# Patient Record
Sex: Female | Born: 1935 | Race: White | Hispanic: No | Smoking: Former smoker
Health system: Southern US, Community
[De-identification: ages and names within clinical notes are randomized; demographics above are authoritative.]

## PROBLEM LIST (undated history)

## (undated) DIAGNOSIS — M810 Age-related osteoporosis without current pathological fracture: Secondary | ICD-10-CM

## (undated) DIAGNOSIS — I8393 Asymptomatic varicose veins of bilateral lower extremities: Secondary | ICD-10-CM

## (undated) DIAGNOSIS — M1990 Unspecified osteoarthritis, unspecified site: Secondary | ICD-10-CM

## (undated) DIAGNOSIS — H3530 Unspecified macular degeneration: Secondary | ICD-10-CM

## (undated) DIAGNOSIS — Z9049 Acquired absence of other specified parts of digestive tract: Secondary | ICD-10-CM

## (undated) DIAGNOSIS — I10 Essential (primary) hypertension: Secondary | ICD-10-CM

## (undated) DIAGNOSIS — E785 Hyperlipidemia, unspecified: Secondary | ICD-10-CM

## (undated) DIAGNOSIS — A809 Acute poliomyelitis, unspecified: Secondary | ICD-10-CM

## (undated) HISTORY — DX: Unspecified osteoarthritis, unspecified site: M19.90

## (undated) HISTORY — PX: BREAST LUMPECTOMY W/ NEEDLE LOCALIZATION: SHX1266

## (undated) HISTORY — DX: Acute poliomyelitis, unspecified: A80.9

## (undated) HISTORY — DX: Acquired absence of other specified parts of digestive tract: Z90.49

---

## 2013-06-06 ENCOUNTER — Other Ambulatory Visit: Payer: Self-pay

## 2013-06-06 DIAGNOSIS — Z1231 Encounter for screening mammogram for malignant neoplasm of breast: Secondary | ICD-10-CM

## 2013-07-03 ENCOUNTER — Ambulatory Visit
Admission: RE | Admit: 2013-07-03 | Discharge: 2013-07-03 | Disposition: A | Discharge status: Home / Self Care | Payer: Medicare Other | Source: Ambulatory Visit

## 2013-07-03 DIAGNOSIS — Z1231 Encounter for screening mammogram for malignant neoplasm of breast: Secondary | ICD-10-CM

## 2015-04-13 ENCOUNTER — Other Ambulatory Visit: Payer: Self-pay | Admitting: Family Medicine

## 2015-04-13 DIAGNOSIS — Z1231 Encounter for screening mammogram for malignant neoplasm of breast: Secondary | ICD-10-CM

## 2015-04-13 DIAGNOSIS — M810 Age-related osteoporosis without current pathological fracture: Secondary | ICD-10-CM

## 2015-04-29 ENCOUNTER — Other Ambulatory Visit: Payer: Self-pay

## 2015-04-29 ENCOUNTER — Ambulatory Visit: Payer: Self-pay

## 2015-05-04 ENCOUNTER — Ambulatory Visit
Admission: RE | Admit: 2015-05-04 | Discharge: 2015-05-04 | Disposition: A | Discharge status: Home / Self Care | Payer: Commercial Managed Care - HMO | Source: Ambulatory Visit | Attending: Family Medicine | Admitting: Family Medicine

## 2015-05-04 ENCOUNTER — Other Ambulatory Visit: Payer: Self-pay

## 2015-05-04 DIAGNOSIS — Z1231 Encounter for screening mammogram for malignant neoplasm of breast: Secondary | ICD-10-CM

## 2017-01-05 DIAGNOSIS — H353221 Exudative age-related macular degeneration, left eye, with active choroidal neovascularization: Secondary | ICD-10-CM | POA: Diagnosis not present

## 2017-01-18 DIAGNOSIS — N6332 Unspecified lump in axillary tail of the left breast: Secondary | ICD-10-CM | POA: Diagnosis not present

## 2017-01-18 DIAGNOSIS — R928 Other abnormal and inconclusive findings on diagnostic imaging of breast: Secondary | ICD-10-CM | POA: Diagnosis not present

## 2017-01-18 DIAGNOSIS — R229 Localized swelling, mass and lump, unspecified: Secondary | ICD-10-CM | POA: Diagnosis not present

## 2017-02-16 DIAGNOSIS — R2232 Localized swelling, mass and lump, left upper limb: Secondary | ICD-10-CM | POA: Diagnosis not present

## 2017-02-23 DIAGNOSIS — H353232 Exudative age-related macular degeneration, bilateral, with inactive choroidal neovascularization: Secondary | ICD-10-CM | POA: Diagnosis not present

## 2017-03-22 DIAGNOSIS — R69 Illness, unspecified: Secondary | ICD-10-CM | POA: Diagnosis not present

## 2017-04-03 DIAGNOSIS — E785 Hyperlipidemia, unspecified: Secondary | ICD-10-CM | POA: Diagnosis not present

## 2017-04-03 DIAGNOSIS — D235 Other benign neoplasm of skin of trunk: Secondary | ICD-10-CM | POA: Diagnosis not present

## 2017-04-03 DIAGNOSIS — I10 Essential (primary) hypertension: Secondary | ICD-10-CM | POA: Diagnosis not present

## 2017-04-03 DIAGNOSIS — Z87891 Personal history of nicotine dependence: Secondary | ICD-10-CM | POA: Diagnosis not present

## 2017-04-03 DIAGNOSIS — R2232 Localized swelling, mass and lump, left upper limb: Secondary | ICD-10-CM | POA: Diagnosis not present

## 2017-04-03 DIAGNOSIS — R222 Localized swelling, mass and lump, trunk: Secondary | ICD-10-CM | POA: Diagnosis not present

## 2017-04-03 DIAGNOSIS — Z01818 Encounter for other preprocedural examination: Secondary | ICD-10-CM | POA: Diagnosis not present

## 2017-04-03 DIAGNOSIS — D1722 Benign lipomatous neoplasm of skin and subcutaneous tissue of left arm: Secondary | ICD-10-CM | POA: Diagnosis not present

## 2017-04-06 DIAGNOSIS — Z791 Long term (current) use of non-steroidal anti-inflammatories (NSAID): Secondary | ICD-10-CM | POA: Diagnosis not present

## 2017-04-06 DIAGNOSIS — Z7982 Long term (current) use of aspirin: Secondary | ICD-10-CM | POA: Diagnosis not present

## 2017-04-06 DIAGNOSIS — Z09 Encounter for follow-up examination after completed treatment for conditions other than malignant neoplasm: Secondary | ICD-10-CM | POA: Diagnosis not present

## 2017-04-06 DIAGNOSIS — Z0000 Encounter for general adult medical examination without abnormal findings: Secondary | ICD-10-CM | POA: Diagnosis not present

## 2017-04-06 DIAGNOSIS — I10 Essential (primary) hypertension: Secondary | ICD-10-CM | POA: Diagnosis not present

## 2017-04-06 DIAGNOSIS — M1611 Unilateral primary osteoarthritis, right hip: Secondary | ICD-10-CM | POA: Diagnosis not present

## 2017-04-06 DIAGNOSIS — M810 Age-related osteoporosis without current pathological fracture: Secondary | ICD-10-CM | POA: Diagnosis not present

## 2017-04-06 DIAGNOSIS — Z6821 Body mass index (BMI) 21.0-21.9, adult: Secondary | ICD-10-CM | POA: Diagnosis not present

## 2017-04-06 DIAGNOSIS — H3530 Unspecified macular degeneration: Secondary | ICD-10-CM | POA: Diagnosis not present

## 2017-04-06 DIAGNOSIS — Z7983 Long term (current) use of bisphosphonates: Secondary | ICD-10-CM | POA: Diagnosis not present

## 2017-04-06 DIAGNOSIS — E7800 Pure hypercholesterolemia, unspecified: Secondary | ICD-10-CM | POA: Diagnosis not present

## 2017-04-07 DIAGNOSIS — M1610 Unilateral primary osteoarthritis, unspecified hip: Secondary | ICD-10-CM | POA: Diagnosis not present

## 2017-04-13 DIAGNOSIS — H353221 Exudative age-related macular degeneration, left eye, with active choroidal neovascularization: Secondary | ICD-10-CM | POA: Diagnosis not present

## 2017-04-17 IMAGING — MG MM DIGITAL SCREENING BILAT W/ CAD
4 series · 4 of 4 positions shown · non-contrast
Comparison: Previous exam(s).

CLINICAL DATA: Screening.

EXAM:
DIGITAL SCREENING BILATERAL MAMMOGRAM WITH CAD

[R CC]
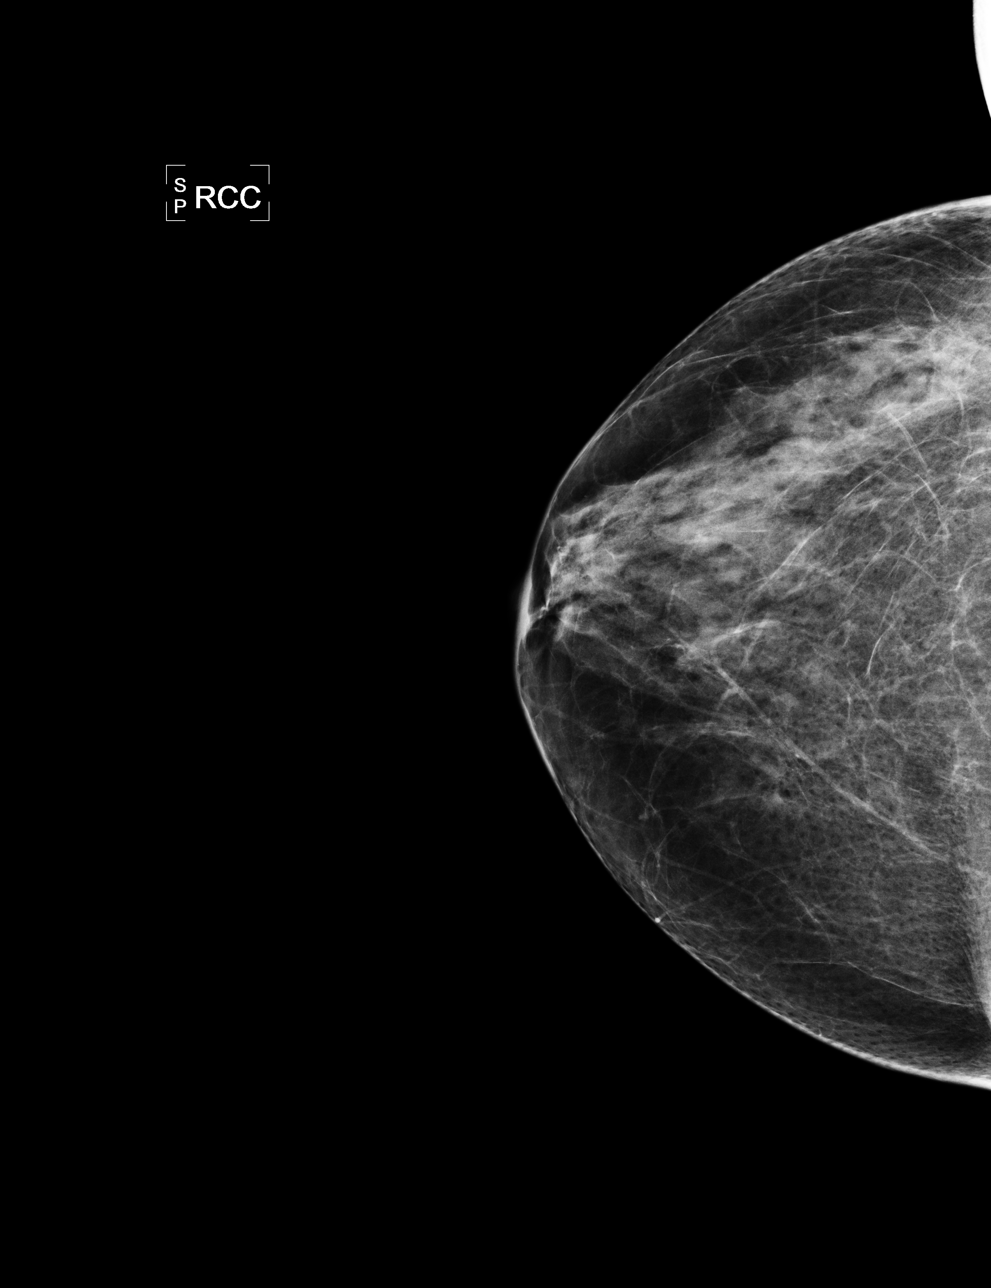

[L CC]
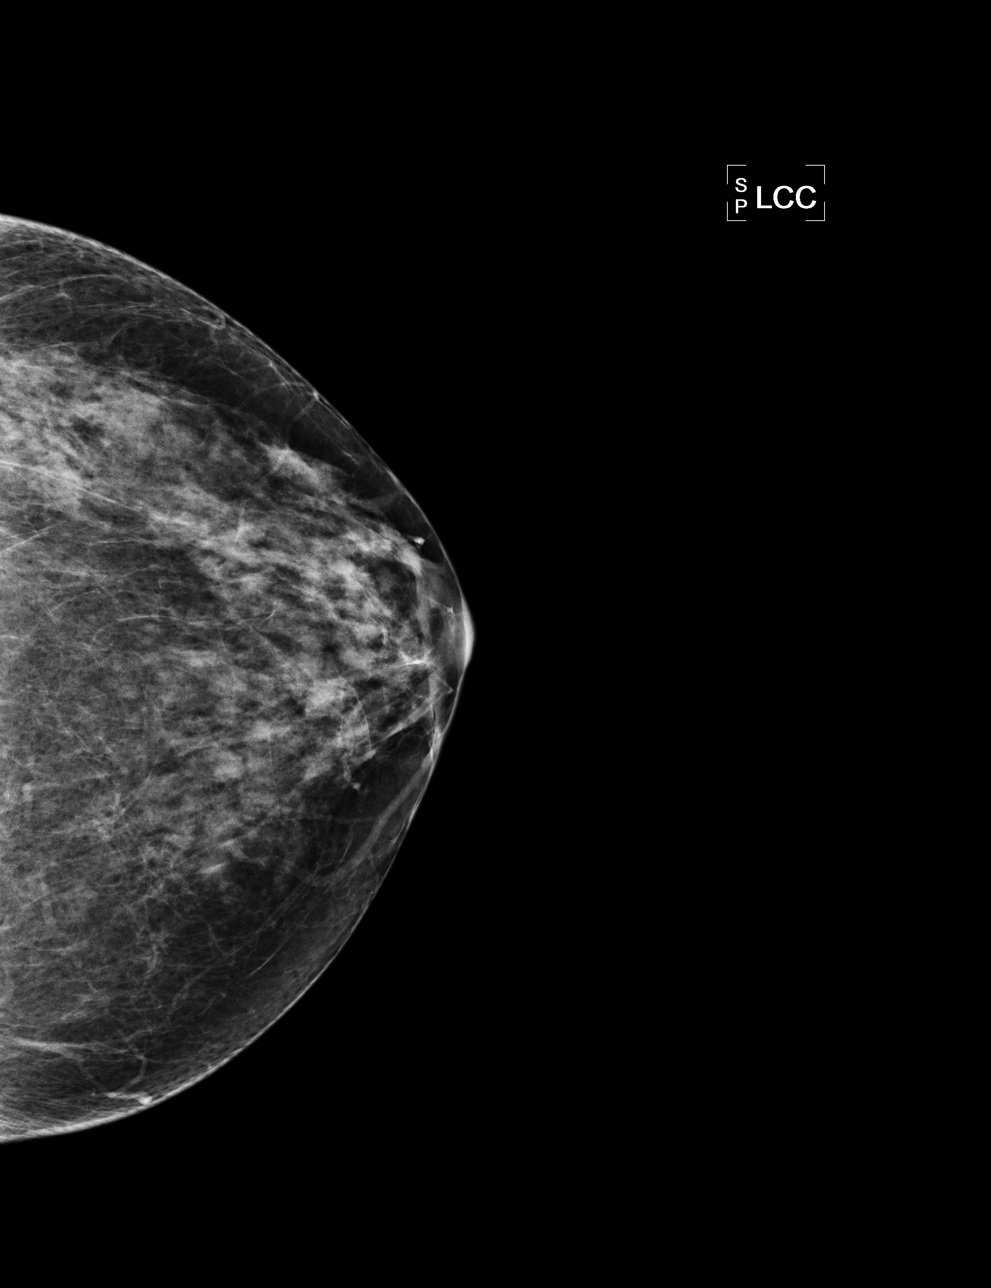

[L MLO]
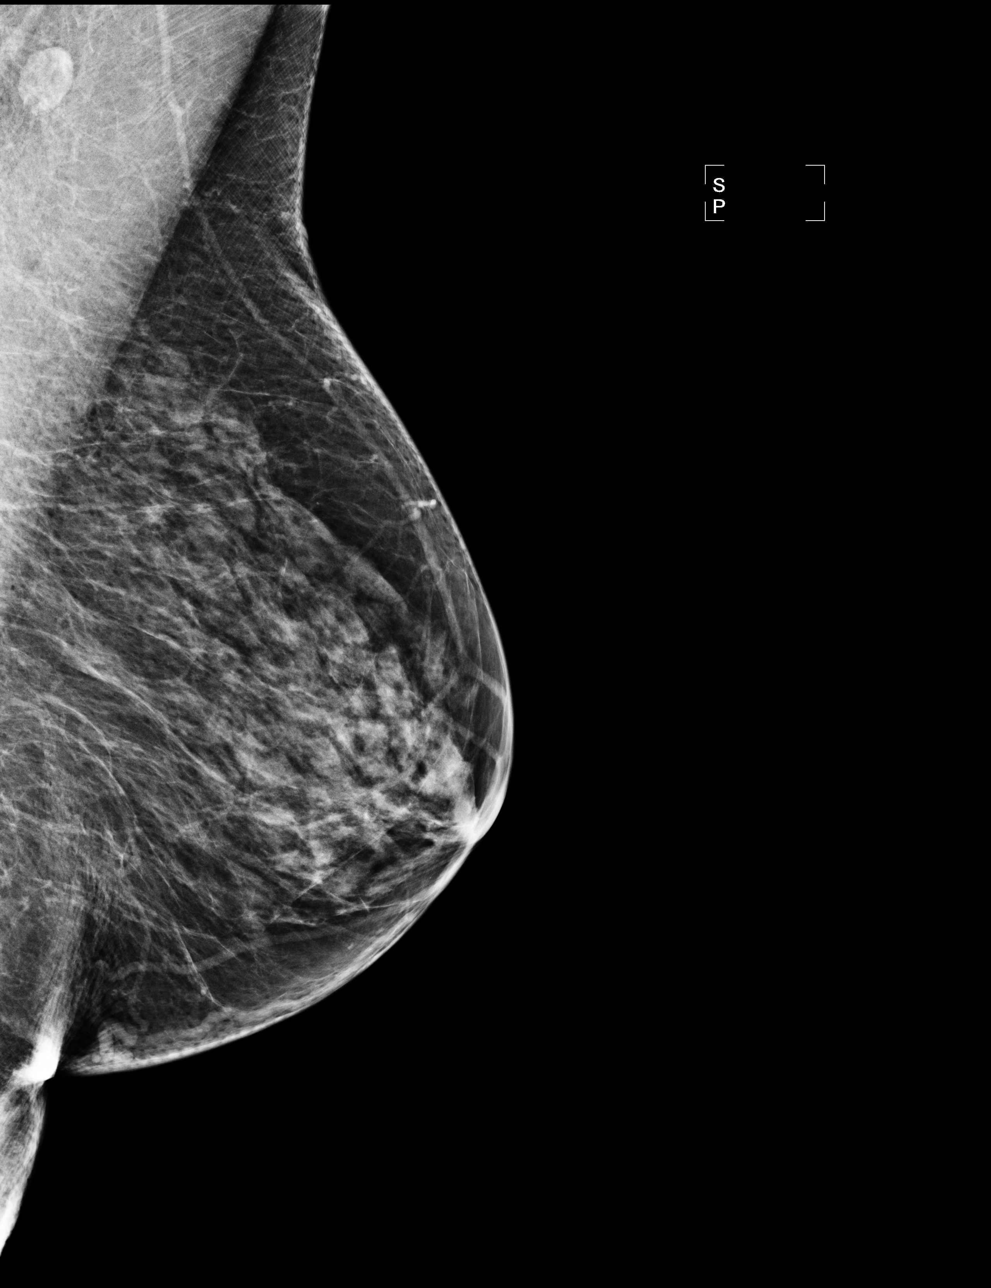

[R MLO]
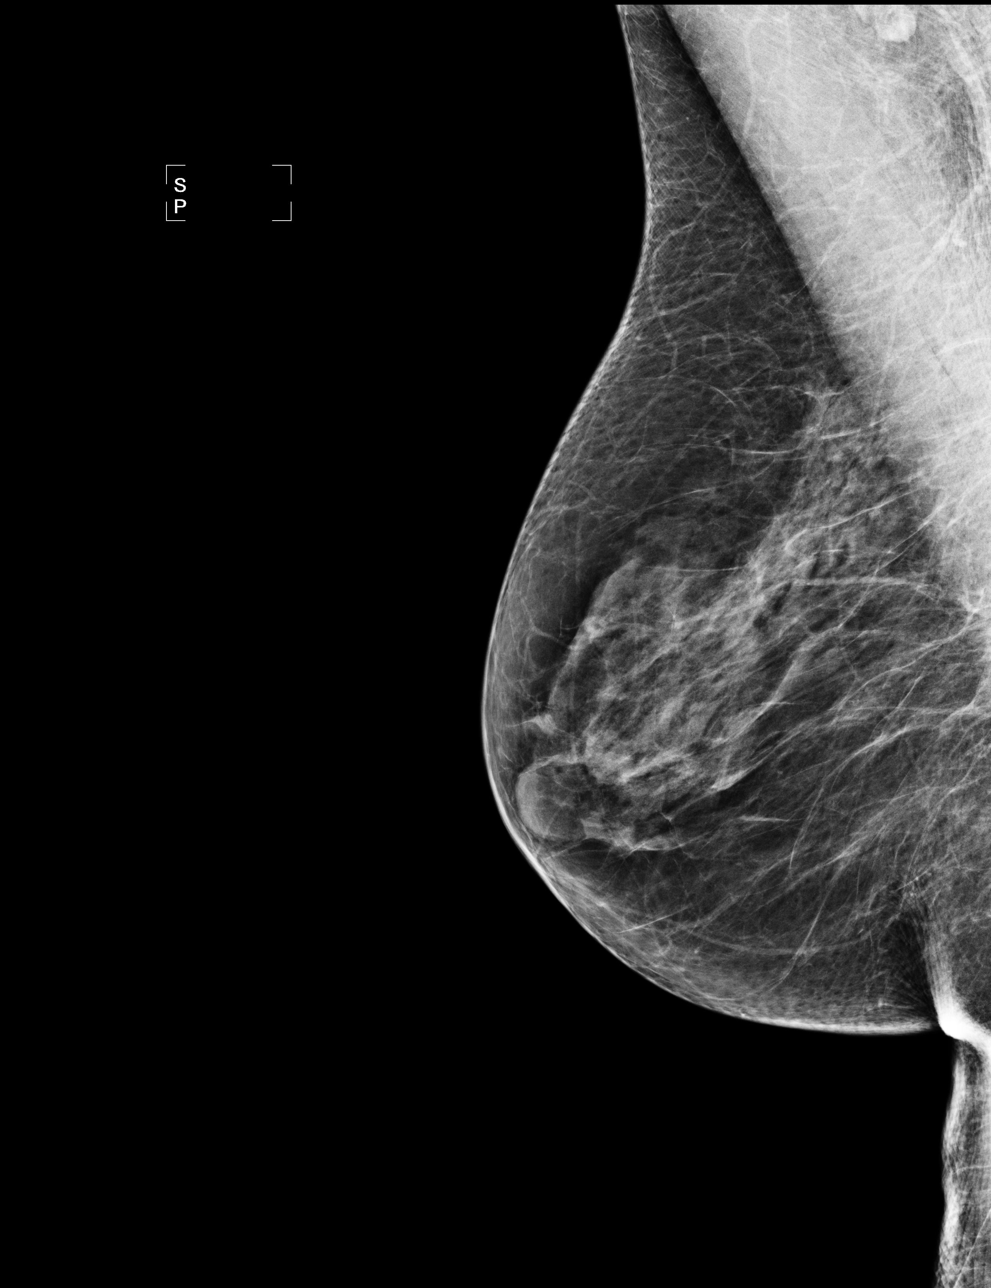

[4 of 4 positions shown; findings below may reference images not displayed]

ACR Breast Density Category c: The breast tissue is heterogeneously
dense, which may obscure small masses.
FINDINGS: There are no findings suspicious for malignancy. Images were
processed with CAD.
IMPRESSION: No mammographic evidence of malignancy. A result letter of this
screening mammogram will be mailed directly to the patient.

RECOMMENDATION:
Screening mammogram in one year. (Code:[id number])

BI-RADS CATEGORY  1: Negative.

## 2017-05-02 DIAGNOSIS — Z09 Encounter for follow-up examination after completed treatment for conditions other than malignant neoplasm: Secondary | ICD-10-CM | POA: Diagnosis not present

## 2017-05-25 DIAGNOSIS — H353222 Exudative age-related macular degeneration, left eye, with inactive choroidal neovascularization: Secondary | ICD-10-CM | POA: Diagnosis not present

## 2017-06-08 DIAGNOSIS — M1611 Unilateral primary osteoarthritis, right hip: Secondary | ICD-10-CM | POA: Diagnosis not present

## 2017-06-22 DIAGNOSIS — H353221 Exudative age-related macular degeneration, left eye, with active choroidal neovascularization: Secondary | ICD-10-CM | POA: Diagnosis not present

## 2017-07-27 DIAGNOSIS — H353221 Exudative age-related macular degeneration, left eye, with active choroidal neovascularization: Secondary | ICD-10-CM | POA: Diagnosis not present

## 2017-08-10 ENCOUNTER — Ambulatory Visit: Payer: Self-pay | Admitting: Orthopedic Surgery

## 2017-08-14 ENCOUNTER — Ambulatory Visit: Payer: Self-pay | Admitting: Orthopedic Surgery

## 2017-08-14 NOTE — H&P (Signed)
Pamela Price DOB: 11-Jun-1933 Married / Language: English / Race: White Female Date of admission: August 30, 2017  Chief complaint: Right hip pain History of Present Illness  The patient is a 83 year old female who comes in for a preoperative History and Physical. The patient is scheduled for a right total hip arthroplasty (anterior) to be performed by Dr. Dione Plover. Aluisio, MD at [facility] on 08/30/2017. The patient is a 83 year old female who presented with a hip problem. The patient was seen for a second opinion.The patient reports right hip problems including pain symptoms that have been present for 1 year(s). The symptoms began without any known injury. Symptoms reported include hip pain, stiffness and difficulty flexing hip The patient reports symptoms radiating to the: right thigh and right knee. The patient describes the hip problem as aching. Onset of symptoms was gradual with symptoms now occuring frequently.The symptoms are described as mild.The patient feels as if their symptoms are does feel they are worsening. Symptoms are exacerbated by squatting and sitting. Symptoms are relieved by stretching. Current treatment includes nonsteroidal anti-inflammatory drugs (Mobic bid). Prior to being seen, the patient was previously evaluated by a colleague (Dr. Ian Malkin at [place]). Previous workup for this problem has included hip x-rays. Previous treatment for this problem has included none (other than the Mobic). she is having considerable pain in right hip and even worse of the functional limitations that she is experiencing. The hip feels unstable to her and she feels as though she is going to fall. She has pain with all activities but she said she can tolerate the pain. She cannot tolerate the dysfunction and inability to do think she desires. She also cannot tolerate the unstable feeling of the hip. She does not have night pain at this point. She is unable to do  things that she desires to do. She is ready to get the hip fixed at this time. They have been treated conservatively in the past for the above stated problem and despite conservative measures, they continue to have progressive pain and severe functional limitations and dysfunction. They have failed non-operative management including home exercise, medications. It is felt that they would benefit from undergoing total joint replacement. Risks and benefits of the procedure have been discussed with the patient and they elect to proceed with surgery. There are no active contraindications to surgery such as ongoing infection or rapidly progressive neurological disease.    Problem List/Past Medical  Primary osteoarthritis of right hip (M16.11)  High blood pressure  Hypercholesterolemia  Osteoporosis  Osteoarthritis  Polio  Childhood Illness Macular Degeneration   Allergies SulfADIAZINE *Sulfonamides**  Rash.  Family History Cancer  Father, Mother.  Social History Children  3 Current work status  retired Furniture conservator/restorer weekly; does running / walking Living situation  live with spouse Marital status  married Never consumed alcohol  06/08/2017: Never consumed alcohol No history of drug/alcohol rehab  Not under pain contract  Number of flights of stairs before winded  4-5 Tobacco use  Never smoker. 06/08/2017  Medication History  Mobic (7.5MG  Tablet, Oral two times daily) Active. Lisinopril (Oral) Specific strength unknown - Active. Pravastatin Sodium (Oral) Specific strength unknown - Active. Alendronate Sodium (Oral) Specific strength unknown - Active. Eye Vitamins (Oral) Active. Aspirin (81MG  Tablet, Oral) Active. Calcium Active.   Past Surgical History  Benign Tumor Excision Left Chest Wall     Review of Systems General Not Present- Chills, Fatigue, Fever, Memory Loss,  Night Sweats, Weight Gain and Weight Loss. Skin Not Present- Eczema,  Hives, Itching, Lesions and Rash. HEENT Not Present- Dentures, Double Vision, Headache, Hearing Loss, Tinnitus and Visual Loss. Respiratory Not Present- Allergies, Chronic Cough, Coughing up blood, Shortness of breath at rest and Shortness of breath with exertion. Cardiovascular Not Present- Chest Pain, Difficulty Breathing Lying Down, Murmur, Palpitations, Racing/skipping heartbeats and Swelling. Gastrointestinal Not Present- Abdominal Pain, Bloody Stool, Constipation, Diarrhea, Difficulty Swallowing, Heartburn, Jaundice, Loss of appetitie, Nausea and Vomiting. Female Genitourinary Not Present- Blood in Urine, Discharge, Flank Pain, Incontinence, Painful Urination, Urgency, Urinary frequency, Urinary Retention, Urinating at Night and Weak urinary stream. Musculoskeletal Present- Joint Pain. Not Present- Back Pain, Joint Swelling, Morning Stiffness, Muscle Pain, Muscle Weakness and Spasms. Neurological Not Present- Blackout spells, Difficulty with balance, Dizziness, Paralysis, Tremor and Weakness. Psychiatric Not Present- Insomnia.  Vitals Weight: 113 lb Height: 62in Weight was reported by patient. Height was reported by patient. Body Surface Area: 1.5 m Body Mass Index: 20.67 kg/m  Pulse: 76 (Regular)  BP: 144/70 (Sitting, Right Arm, Standard)    Physical Exam  General Mental Status -Alert, cooperative and good historian. General Appearance-pleasant, Not in acute distress. Orientation-Oriented X3. Build & Nutrition-Well nourished and Well developed.  Head and Neck Head-normocephalic, atraumatic . Neck Global Assessment - supple, no bruit auscultated on the right, no bruit auscultated on the left.  Eye Pupil - Bilateral-Regular and Round. Motion - Bilateral-EOMI.  Chest and Lung Exam Auscultation Breath sounds - clear at anterior chest wall and clear at posterior chest wall. Adventitious sounds - No Adventitious  sounds.  Cardiovascular Auscultation Rhythm - Regular rate and rhythm. Heart Sounds - S1 WNL and S2 WNL. Murmurs & Other Heart Sounds - Auscultation of the heart reveals - No Murmurs.  Abdomen Palpation/Percussion Tenderness - Abdomen is non-tender to palpation. Rigidity (guarding) - Abdomen is soft. Auscultation Auscultation of the abdomen reveals - Bowel sounds normal.  Female Genitourinary Note: Not done, not pertinent to present illness   Musculoskeletal Note: Evaluation of the left hip shows flexion to 120 rotation in 30 out 40 and abduction 40 without discomfort. There is no tenderness over the greater trochanter. There is no pain on provocative testing of the hip. Her right hip shows flexion to 100 no internal rotation approximately 10 of external rotation and approximately 20 of abduction. she does not have any tenderness about the right hip. She has a significantly antalgic gait pattern on the right. Knee exams are unremarkable. Pulses are trace palpable with intact sensation and motor in both lower extremities. Her radiographs AP pelvis and lateral of the right hip show severe bone-on-bone arthritis in the right hip with subchondral cystic formation and osteophyte formation.   Assessment & Plan  Primary osteoarthritis of right hip (M16.11)  Note:Surgical Plans: Right Total Hip Replacement - Anterior Approach  Disposition: Home with family  PCP: Dr. Karren Cobble - pending at time of H&P  IV TXA  Anesthesia Issues: None  Patient was instructed on what medications to stop prior to surgery.  Signed electronically by Joelene Millin, III PA-C

## 2017-08-17 DIAGNOSIS — N3000 Acute cystitis without hematuria: Secondary | ICD-10-CM | POA: Diagnosis not present

## 2017-08-17 DIAGNOSIS — Z23 Encounter for immunization: Secondary | ICD-10-CM | POA: Diagnosis not present

## 2017-08-17 DIAGNOSIS — M1611 Unilateral primary osteoarthritis, right hip: Secondary | ICD-10-CM | POA: Diagnosis not present

## 2017-08-17 DIAGNOSIS — Z01810 Encounter for preprocedural cardiovascular examination: Secondary | ICD-10-CM | POA: Diagnosis not present

## 2017-08-17 DIAGNOSIS — M25511 Pain in right shoulder: Secondary | ICD-10-CM | POA: Diagnosis not present

## 2017-08-17 DIAGNOSIS — R8299 Other abnormal findings in urine: Secondary | ICD-10-CM | POA: Diagnosis not present

## 2017-08-17 DIAGNOSIS — Z112 Encounter for screening for other bacterial diseases: Secondary | ICD-10-CM | POA: Diagnosis not present

## 2017-08-18 DIAGNOSIS — Z01810 Encounter for preprocedural cardiovascular examination: Secondary | ICD-10-CM | POA: Diagnosis not present

## 2017-08-18 DIAGNOSIS — E782 Mixed hyperlipidemia: Secondary | ICD-10-CM | POA: Diagnosis not present

## 2017-08-18 DIAGNOSIS — R5383 Other fatigue: Secondary | ICD-10-CM | POA: Diagnosis not present

## 2017-08-21 DIAGNOSIS — N3000 Acute cystitis without hematuria: Secondary | ICD-10-CM | POA: Diagnosis not present

## 2017-08-23 NOTE — Patient Instructions (Signed)
Pamela Price  08/23/2017   Your procedure is scheduled on: 08-30-17  Report to [facility] Main  Entrance   Report to admitting at 12PM   Call this number if you have problems the morning of surgery [phone]   Remember: ONLY 1 PERSON MAY GO WITH YOU TO SHORT STAY TO GET  READY MORNING OF YOUR SURGERY.  Do not eat food After Midnight. You may have clear liquids from midnight until 830am day of surgery. Nothing by mouth after 830am!     Take these medicines the morning of surgery with A SIP OF WATER: none                                You may not have any metal on your body including hair pins and              piercings  Do not wear jewelry, make-up, lotions, powders or perfumes, deodorant             Do not wear nail polish.  Do not shave  48 hours prior to surgery.          Do not bring valuables to the hospital. [place].  Contacts, dentures or bridgework may not be worn into surgery.  Leave suitcase in the car. After surgery it may be brought to your room.                Please read over the following fact sheets you were given: _____________________________________________________________________  CLEAR LIQUID DIET   Foods Allowed                                                                     Foods Excluded  Coffee and tea, regular and decaf                             liquids that you cannot  Plain Jell-O in any flavor                                             see through such as: Fruit ices (not with fruit pulp)                                     milk, soups, orange juice  Iced Popsicles                                    All solid food Carbonated beverages, regular and diet  Cranberry, grape and apple juices Sports drinks like Gatorade Lightly seasoned clear broth or consume(fat free) Sugar, honey syrup  Sample Menu Breakfast                                 Lunch                                     Supper Cranberry juice                    Beef broth                            Chicken broth Jell-O                                     Grape juice                           Apple juice Coffee or tea                        Jell-O                                      Popsicle                                                Coffee or tea                        Coffee or tea  _____________________________________________________________________  [facility] - Preparing for Surgery Before surgery, you can play an important role.  Because skin is not sterile, your skin needs to be as free of germs as possible.  You can reduce the number of germs on your skin by washing with CHG (chlorahexidine gluconate) soap before surgery.  CHG is an antiseptic cleaner which kills germs and bonds with the skin to continue killing germs even after washing. Please DO NOT use if you have an allergy to CHG or antibacterial soaps.  If your skin becomes reddened/irritated stop using the CHG and inform your nurse when you arrive at Short Stay. Do not shave (including legs and underarms) for at least 48 hours prior to the first CHG shower.  You may shave your face/neck. Please follow these instructions carefully:  1.  Shower with CHG Soap the night before surgery and the  morning of Surgery.  2.  If you choose to wash your hair, wash your hair first as usual with your  normal  shampoo.  3.  After you shampoo, rinse your hair and body thoroughly to remove the  shampoo.                           4.  Use CHG as you would any other liquid soap.  You can apply chg directly  to the skin and wash  Gently with a scrungie or clean washcloth.  5.  Apply the CHG Soap to your body ONLY FROM THE NECK DOWN.   Do not use on face/ open                           Wound or open sores. Avoid contact with eyes, ears mouth and genitals (private parts).                        Wash face,  Genitals (private parts) with your normal soap.             6.  Wash thoroughly, paying special attention to the area where your surgery  will be performed.  7.  Thoroughly rinse your body with warm water from the neck down.  8.  DO NOT shower/wash with your normal soap after using and rinsing off  the CHG Soap.                9.  Pat yourself dry with a clean towel.            10.  Wear clean pajamas.            11.  Place clean sheets on your bed the night of your first shower and do not  sleep with pets. Day of Surgery : Do not apply any lotions/deodorants the morning of surgery.  Please wear clean clothes to the hospital/surgery center.  FAILURE TO FOLLOW THESE INSTRUCTIONS MAY RESULT IN THE CANCELLATION OF YOUR SURGERY PATIENT SIGNATURE_________________________________  NURSE SIGNATURE__________________________________  ________________________________________________________________________   [name]  An incentive spirometer is a tool that can help keep your lungs clear and active. This tool measures how well you are filling your lungs with each breath. Taking long deep breaths may help reverse or decrease the chance of developing breathing (pulmonary) problems (especially infection) following:  A long period of time when you are unable to move or be active. BEFORE THE PROCEDURE   If the spirometer includes an indicator to show your best effort, your nurse or respiratory therapist will set it to a desired goal.  If possible, sit up straight or lean slightly forward. Try not to slouch.  Hold the incentive spirometer in an upright position. INSTRUCTIONS FOR USE  1. Sit on the edge of your bed if possible, or sit up as far as you can in bed or on a chair. 2. Hold the incentive spirometer in an upright position. 3. Breathe out normally. 4. Place the mouthpiece in your mouth and seal your lips tightly around it. 5. Breathe in slowly and as deeply  as possible, raising the piston or the ball toward the top of the column. 6. Hold your breath for 3-5 seconds or for as long as possible. Allow the piston or ball to fall to the bottom of the column. 7. Remove the mouthpiece from your mouth and breathe out normally. 8. Rest for a few seconds and repeat Steps 1 through 7 at least 10 times every 1-2 hours when you are awake. Take your time and take a few normal breaths between deep breaths. 9. The spirometer may include an indicator to show your best effort. Use the indicator as a goal to work toward during each repetition. 10. After each set of 10 deep breaths, practice coughing to be sure your lungs are clear. If you have an incision (the cut made at the time of  surgery), support your incision when coughing by placing a pillow or rolled up towels firmly against it. Once you are able to get out of bed, walk around indoors and cough well. You may stop using the incentive spirometer when instructed by your caregiver.  RISKS AND COMPLICATIONS  Take your time so you do not get dizzy or light-headed.  If you are in pain, you may need to take or ask for pain medication before doing incentive spirometry. It is harder to take a deep breath if you are having pain. AFTER USE  Rest and breathe slowly and easily.  It can be helpful to keep track of a log of your progress. Your caregiver can provide you with a simple table to help with this. If you are using the spirometer at home, follow these instructions: [place] IF:   You are having difficultly using the spirometer.  You have trouble using the spirometer as often as instructed.  Your pain medication is not giving enough relief while using the spirometer.  You develop fever of 100.5 F (38.1 C) or higher. SEEK IMMEDIATE MEDICAL CARE IF:   You cough up bloody sputum that had not been present before.  You develop fever of 102 F (38.9 C) or greater.  You develop worsening pain at or  near the incision site. MAKE SURE YOU:   Understand these instructions.  Will watch your condition.  Will get help right away if you are not doing well or get worse. Document Released: 04/10/2007 Document Revised: 02/20/2012 Document Reviewed: 06/11/2007 Exit[employer] Patient Information 2014 [employer], [place].   ________________________________________________________________________  WHAT IS A BLOOD TRANSFUSION? Blood Transfusion Information  A transfusion is the replacement of blood or some of its parts. Blood is made up of multiple cells which provide different functions.  Red blood cells carry oxygen and are used for blood loss replacement.  White blood cells fight against infection.  Platelets control bleeding.  Plasma helps clot blood.  Other blood products are available for specialized needs, such as hemophilia or other clotting disorders. BEFORE THE TRANSFUSION  Who gives blood for transfusions?   Healthy volunteers who are fully evaluated to make sure their blood is safe. This is blood bank blood. Transfusion therapy is the safest it has ever been in the practice of medicine. Before blood is taken from a donor, a complete history is taken to make sure that person has no history of diseases nor engages in risky social behavior (examples are intravenous drug use or sexual activity with multiple partners). The donor's travel history is screened to minimize risk of transmitting infections, such as malaria. The donated blood is tested for signs of infectious diseases, such as HIV and hepatitis. The blood is then tested to be sure it is compatible with you in order to minimize the chance of a transfusion reaction. If you or a relative donates blood, this is often done in anticipation of surgery and is not appropriate for emergency situations. It takes many days to process the donated blood. RISKS AND COMPLICATIONS Although transfusion therapy is very safe and saves many lives, the main  dangers of transfusion include:   Getting an infectious disease.  Developing a transfusion reaction. This is an allergic reaction to something in the blood you were given. Every precaution is taken to prevent this. The decision to have a blood transfusion has been considered carefully by your caregiver before blood is given. Blood is not given unless the benefits outweigh the risks. AFTER THE  TRANSFUSION  Right after receiving a blood transfusion, you will usually feel much better and more energetic. This is especially true if your red blood cells have gotten low (anemic). The transfusion raises the level of the red blood cells which carry oxygen, and this usually causes an energy increase.  The nurse administering the transfusion will monitor you carefully for complications. HOME CARE INSTRUCTIONS  No special instructions are needed after a transfusion. You may find your energy is better. Speak with your caregiver about any limitations on activity for underlying diseases you may have. SEEK MEDICAL CARE IF:   Your condition is not improving after your transfusion.  You develop redness or irritation at the intravenous (IV) site. SEEK IMMEDIATE MEDICAL CARE IF:  Any of the following symptoms occur over the next 12 hours:  Shaking chills.  You have a temperature by mouth above 102 F (38.9 C), not controlled by medicine.  Chest, back, or muscle pain.  People around you feel you are not acting correctly or are confused.  Shortness of breath or difficulty breathing.  Dizziness and fainting.  You get a rash or develop hives.  You have a decrease in urine output.  Your urine turns a dark color or changes to pink, red, or brown. Any of the following symptoms occur over the next 10 days:  You have a temperature by mouth above 102 F (38.9 C), not controlled by medicine.  Shortness of breath.  Weakness after normal activity.  The white part of the eye turns yellow  (jaundice).  You have a decrease in the amount of urine or are urinating less often.  Your urine turns a dark color or changes to pink, red, or brown. Document Released: 11/25/2000 Document Revised: 02/20/2012 Document Reviewed: 07/14/2008 [facility] Patient Information 2014 [place], [place].  _______________________________________________________________________

## 2017-08-23 NOTE — Progress Notes (Signed)
LOV Dr [name] 08-17-17 on chart from [facility]   EKG 08-17-17 on chart from [place] culture 08-20-17 on chart from [facility], CMP , lipid panel, TSH on chart from [facility]

## 2017-08-24 ENCOUNTER — Encounter (HOSPITAL_COMMUNITY): Payer: Self-pay

## 2017-08-24 ENCOUNTER — Encounter (HOSPITAL_COMMUNITY)
Admission: RE | Admit: 2017-08-24 | Discharge: 2017-08-24 | Disposition: A | Discharge status: Home / Self Care | Payer: Medicare HMO | Source: Ambulatory Visit | Attending: Orthopedic Surgery | Admitting: Orthopedic Surgery

## 2017-08-24 DIAGNOSIS — M1611 Unilateral primary osteoarthritis, right hip: Secondary | ICD-10-CM | POA: Diagnosis not present

## 2017-08-24 DIAGNOSIS — Z01812 Encounter for preprocedural laboratory examination: Secondary | ICD-10-CM | POA: Insufficient documentation

## 2017-08-24 HISTORY — DX: Asymptomatic varicose veins of bilateral lower extremities: I83.93

## 2017-08-24 HISTORY — DX: Essential (primary) hypertension: I10

## 2017-08-24 HISTORY — DX: Unspecified osteoarthritis, unspecified site: M19.90

## 2017-08-24 HISTORY — DX: Unspecified macular degeneration: H35.30

## 2017-08-24 HISTORY — DX: Hyperlipidemia, unspecified: E78.5

## 2017-08-24 HISTORY — DX: Age-related osteoporosis without current pathological fracture: M81.0

## 2017-08-24 LAB — APTT: aPTT: 31 s (ref 24–36)

## 2017-08-24 LAB — PROTIME-INR
INR: 1.01
Prothrombin Time: 13.2 s (ref 11.4–15.2)

## 2017-08-25 LAB — ABO/RH: ABO/RH(D): A POS

## 2017-08-29 LAB — TYPE AND SCREEN
ABO/RH(D): A POS
Antibody Screen: NEGATIVE

## 2017-08-30 ENCOUNTER — Inpatient Hospital Stay (HOSPITAL_COMMUNITY): Payer: Medicare HMO | Admitting: Certified Registered Nurse Anesthetist

## 2017-08-30 ENCOUNTER — Inpatient Hospital Stay (HOSPITAL_COMMUNITY): Payer: Medicare HMO

## 2017-08-30 ENCOUNTER — Encounter (HOSPITAL_COMMUNITY)
Admission: RE | Disposition: A | Discharge status: Home Health | Payer: Self-pay | Source: Ambulatory Visit | Attending: Orthopedic Surgery

## 2017-08-30 ENCOUNTER — Inpatient Hospital Stay (HOSPITAL_COMMUNITY)
Admission: RE | Admit: 2017-08-30 | Discharge: 2017-09-01 | DRG: 470 | Disposition: A | Discharge status: Home Health | Payer: Medicare HMO | Source: Ambulatory Visit | Attending: Orthopedic Surgery | Admitting: Orthopedic Surgery

## 2017-08-30 ENCOUNTER — Encounter (HOSPITAL_COMMUNITY): Payer: Self-pay

## 2017-08-30 DIAGNOSIS — H3530 Unspecified macular degeneration: Secondary | ICD-10-CM | POA: Diagnosis present

## 2017-08-30 DIAGNOSIS — Z87891 Personal history of nicotine dependence: Secondary | ICD-10-CM

## 2017-08-30 DIAGNOSIS — Z882 Allergy status to sulfonamides status: Secondary | ICD-10-CM | POA: Diagnosis not present

## 2017-08-30 DIAGNOSIS — Z8612 Personal history of poliomyelitis: Secondary | ICD-10-CM

## 2017-08-30 DIAGNOSIS — E785 Hyperlipidemia, unspecified: Secondary | ICD-10-CM | POA: Diagnosis present

## 2017-08-30 DIAGNOSIS — M1611 Unilateral primary osteoarthritis, right hip: Secondary | ICD-10-CM | POA: Diagnosis present

## 2017-08-30 DIAGNOSIS — M1990 Unspecified osteoarthritis, unspecified site: Secondary | ICD-10-CM | POA: Diagnosis present

## 2017-08-30 DIAGNOSIS — M169 Osteoarthritis of hip, unspecified: Secondary | ICD-10-CM

## 2017-08-30 DIAGNOSIS — Z7982 Long term (current) use of aspirin: Secondary | ICD-10-CM | POA: Diagnosis not present

## 2017-08-30 DIAGNOSIS — Z79899 Other long term (current) drug therapy: Secondary | ICD-10-CM | POA: Diagnosis not present

## 2017-08-30 DIAGNOSIS — Z7983 Long term (current) use of bisphosphonates: Secondary | ICD-10-CM | POA: Diagnosis not present

## 2017-08-30 DIAGNOSIS — I739 Peripheral vascular disease, unspecified: Secondary | ICD-10-CM | POA: Diagnosis present

## 2017-08-30 DIAGNOSIS — M810 Age-related osteoporosis without current pathological fracture: Secondary | ICD-10-CM | POA: Diagnosis present

## 2017-08-30 DIAGNOSIS — Z791 Long term (current) use of non-steroidal anti-inflammatories (NSAID): Secondary | ICD-10-CM

## 2017-08-30 DIAGNOSIS — Z471 Aftercare following joint replacement surgery: Secondary | ICD-10-CM | POA: Diagnosis not present

## 2017-08-30 DIAGNOSIS — Z96649 Presence of unspecified artificial hip joint: Secondary | ICD-10-CM

## 2017-08-30 DIAGNOSIS — I10 Essential (primary) hypertension: Secondary | ICD-10-CM | POA: Diagnosis present

## 2017-08-30 DIAGNOSIS — Z96641 Presence of right artificial hip joint: Secondary | ICD-10-CM | POA: Diagnosis not present

## 2017-08-30 DIAGNOSIS — M1711 Unilateral primary osteoarthritis, right knee: Secondary | ICD-10-CM | POA: Diagnosis not present

## 2017-08-30 HISTORY — PX: TOTAL HIP ARTHROPLASTY: SHX124

## 2017-08-30 LAB — CBC
HCT: 31.4 % — ABNORMAL LOW (ref 36.0–46.0)
Hemoglobin: 10.6 g/dL — ABNORMAL LOW (ref 12.0–15.0)
MCH: 28.6 pg (ref 26.0–34.0)
MCHC: 33.8 g/dL (ref 30.0–36.0)
MCV: 84.6 fL (ref 78.0–100.0)
Platelets: 208 10*3/uL (ref 150–400)
RBC: 3.71 MIL/uL — ABNORMAL LOW (ref 3.87–5.11)
RDW: 13.8 % (ref 11.5–15.5)
WBC: 15 10*3/uL — ABNORMAL HIGH (ref 4.0–10.5)

## 2017-08-30 LAB — COMPREHENSIVE METABOLIC PANEL WITH GFR
ALT: 15 U/L (ref 14–54)
AST: 20 U/L (ref 15–41)
Albumin: 3.7 g/dL (ref 3.5–5.0)
Alkaline Phosphatase: 56 U/L (ref 38–126)
Anion gap: 8 (ref 5–15)
BUN: 16 mg/dL (ref 6–20)
CO2: 24 mmol/L (ref 22–32)
Calcium: 8.9 mg/dL (ref 8.9–10.3)
Chloride: 105 mmol/L (ref 101–111)
Creatinine, Ser: 0.62 mg/dL (ref 0.44–1.00)
GFR calc Af Amer: >60 mL/min
GFR calc non Af Amer: >60 mL/min
Glucose, Bld: 125 mg/dL — ABNORMAL HIGH (ref 65–99)
Potassium: 4.1 mmol/L (ref 3.5–5.1)
Sodium: 137 mmol/L (ref 135–145)
Total Bilirubin: 0.7 mg/dL (ref 0.3–1.2)
Total Protein: 6.3 g/dL — ABNORMAL LOW (ref 6.5–8.1)

## 2017-08-30 SURGERY — TOTAL HIP ARTHROPLASTY ANTERIOR APPROACH
Anesthesia: Spinal | Site: Hip | Laterality: Right

## 2017-08-30 MED ORDER — LACTATED RINGERS IV SOLN
INTRAVENOUS | Status: DC
  Administered 2017-08-30: 1000 mL via INTRAVENOUS
  Administered 2017-08-30: 16:00:00 via INTRAVENOUS

## 2017-08-30 MED ORDER — PROPOFOL 10 MG/ML IV BOLUS
INTRAVENOUS | Status: DC | PRN
  Administered 2017-08-30: 20 mg via INTRAVENOUS
  Administered 2017-08-30: 10 mg via INTRAVENOUS
  Administered 2017-08-30: 30 mg via INTRAVENOUS
  Administered 2017-08-30: 10 mg via INTRAVENOUS

## 2017-08-30 MED ORDER — DOCUSATE SODIUM 100 MG PO CAPS
100.0000 mg | ORAL_CAPSULE | Freq: Two times a day (BID) | ORAL | Status: DC
  Administered 2017-08-30 – 2017-09-01 (×4): 100 mg via ORAL
  Filled 2017-08-30 (×4): qty 1

## 2017-08-30 MED ORDER — ACETAMINOPHEN 650 MG RE SUPP: 650.0000 mg | Freq: Four times a day (QID) | RECTAL | Status: DC | PRN

## 2017-08-30 MED ORDER — PHENYLEPHRINE HCL 10 MG/ML IJ SOLN
INTRAMUSCULAR | Status: AC
  Filled 2017-08-30: qty 1

## 2017-08-30 MED ORDER — METOCLOPRAMIDE HCL 5 MG/ML IJ SOLN: 10.0000 mg | Freq: Once | INTRAMUSCULAR | Status: DC | PRN

## 2017-08-30 MED ORDER — MORPHINE SULFATE (PF) 4 MG/ML IV SOLN
1.0000 mg | INTRAVENOUS | Status: DC | PRN
  Administered 2017-08-30: 1 mg via INTRAVENOUS
  Filled 2017-08-30: qty 1

## 2017-08-30 MED ORDER — TRAMADOL HCL 50 MG PO TABS: 50.0000 mg | ORAL_TABLET | Freq: Four times a day (QID) | ORAL | Status: DC | PRN

## 2017-08-30 MED ORDER — FENTANYL CITRATE (PF) 100 MCG/2ML IJ SOLN
INTRAMUSCULAR | Status: AC
  Filled 2017-08-30: qty 2

## 2017-08-30 MED ORDER — BUPIVACAINE HCL 0.25 % IJ SOLN
INTRAMUSCULAR | Status: AC
  Filled 2017-08-30: qty 1

## 2017-08-30 MED ORDER — BISACODYL 10 MG RE SUPP: 10.0000 mg | Freq: Every day | RECTAL | Status: DC | PRN

## 2017-08-30 MED ORDER — PROPOFOL 10 MG/ML IV BOLUS
INTRAVENOUS | Status: AC
  Filled 2017-08-30: qty 40

## 2017-08-30 MED ORDER — 0.9 % SODIUM CHLORIDE (POUR BTL) OPTIME
TOPICAL | Status: DC | PRN
  Administered 2017-08-30: 1000 mL

## 2017-08-30 MED ORDER — METOCLOPRAMIDE HCL 5 MG PO TABS: 5.0000 mg | ORAL_TABLET | Freq: Three times a day (TID) | ORAL | Status: DC | PRN

## 2017-08-30 MED ORDER — DEXAMETHASONE SODIUM PHOSPHATE 10 MG/ML IJ SOLN
10.0000 mg | Freq: Once | INTRAMUSCULAR | Status: AC
  Administered 2017-08-31: 10 mg via INTRAVENOUS
  Filled 2017-08-30: qty 1

## 2017-08-30 MED ORDER — PHENYLEPHRINE HCL 10 MG/ML IJ SOLN
INTRAVENOUS | Status: DC | PRN
  Administered 2017-08-30: 50 ug/min via INTRAVENOUS

## 2017-08-30 MED ORDER — METOCLOPRAMIDE HCL 5 MG/ML IJ SOLN
INTRAMUSCULAR | Status: AC
  Filled 2017-08-30: qty 2

## 2017-08-30 MED ORDER — PROPOFOL 500 MG/50ML IV EMUL
INTRAVENOUS | Status: DC | PRN
  Administered 2017-08-30: 40 ug/kg/min via INTRAVENOUS

## 2017-08-30 MED ORDER — TRANEXAMIC ACID 1000 MG/10ML IV SOLN
1000.0000 mg | INTRAVENOUS | Status: AC
  Administered 2017-08-30: 1000 mg via INTRAVENOUS
  Filled 2017-08-30: qty 1100

## 2017-08-30 MED ORDER — RIVAROXABAN 10 MG PO TABS
10.0000 mg | ORAL_TABLET | Freq: Every day | ORAL | Status: DC
  Administered 2017-08-31 – 2017-09-01 (×2): 10 mg via ORAL
  Filled 2017-08-30 (×2): qty 1

## 2017-08-30 MED ORDER — FLEET ENEMA 7-19 GM/118ML RE ENEM: 1.0000 | ENEMA | Freq: Once | RECTAL | Status: DC | PRN

## 2017-08-30 MED ORDER — PHENOL 1.4 % MT LIQD: 1.0000 | OROMUCOSAL | Status: DC | PRN

## 2017-08-30 MED ORDER — METHOCARBAMOL 1000 MG/10ML IJ SOLN
500.0000 mg | Freq: Four times a day (QID) | INTRAVENOUS | Status: DC | PRN
  Administered 2017-08-30: 500 mg via INTRAVENOUS
  Filled 2017-08-30: qty 550

## 2017-08-30 MED ORDER — CEFAZOLIN SODIUM-DEXTROSE 2-4 GM/100ML-% IV SOLN
2.0000 g | INTRAVENOUS | Status: AC
  Administered 2017-08-30: 2 g via INTRAVENOUS

## 2017-08-30 MED ORDER — MENTHOL 3 MG MT LOZG: 1.0000 | LOZENGE | OROMUCOSAL | Status: DC | PRN

## 2017-08-30 MED ORDER — PHENYLEPHRINE 40 MCG/ML (10ML) SYRINGE FOR IV PUSH (FOR BLOOD PRESSURE SUPPORT)
PREFILLED_SYRINGE | INTRAVENOUS | Status: AC
  Filled 2017-08-30: qty 10

## 2017-08-30 MED ORDER — PRAVASTATIN SODIUM 20 MG PO TABS
40.0000 mg | ORAL_TABLET | Freq: Every day | ORAL | Status: DC
  Administered 2017-08-31: 40 mg via ORAL
  Filled 2017-08-30: qty 2

## 2017-08-30 MED ORDER — METHOCARBAMOL 500 MG PO TABS
500.0000 mg | ORAL_TABLET | Freq: Four times a day (QID) | ORAL | Status: DC | PRN
  Administered 2017-08-30 – 2017-09-01 (×2): 500 mg via ORAL
  Filled 2017-08-30 (×2): qty 1

## 2017-08-30 MED ORDER — ONDANSETRON HCL 4 MG PO TABS: 4.0000 mg | ORAL_TABLET | Freq: Four times a day (QID) | ORAL | Status: DC | PRN

## 2017-08-30 MED ORDER — STERILE WATER FOR IRRIGATION IR SOLN
Status: DC | PRN
  Administered 2017-08-30: 2000 mL

## 2017-08-30 MED ORDER — CEFAZOLIN SODIUM-DEXTROSE 2-4 GM/100ML-% IV SOLN
INTRAVENOUS | Status: AC
  Filled 2017-08-30: qty 100

## 2017-08-30 MED ORDER — METOCLOPRAMIDE HCL 5 MG/ML IJ SOLN
INTRAMUSCULAR | Status: DC | PRN
  Administered 2017-08-30: 10 mg via INTRAVENOUS

## 2017-08-30 MED ORDER — ONDANSETRON HCL 4 MG/2ML IJ SOLN
INTRAMUSCULAR | Status: AC
  Filled 2017-08-30: qty 2

## 2017-08-30 MED ORDER — ACETAMINOPHEN 500 MG PO TABS
1000.0000 mg | ORAL_TABLET | Freq: Four times a day (QID) | ORAL | Status: AC
  Administered 2017-08-30 – 2017-08-31 (×4): 1000 mg via ORAL
  Filled 2017-08-30 (×4): qty 2

## 2017-08-30 MED ORDER — DEXAMETHASONE SODIUM PHOSPHATE 10 MG/ML IJ SOLN
10.0000 mg | Freq: Once | INTRAMUSCULAR | Status: AC
  Administered 2017-08-30: 10 mg via INTRAVENOUS

## 2017-08-30 MED ORDER — ACETAMINOPHEN 10 MG/ML IV SOLN
1000.0000 mg | Freq: Once | INTRAVENOUS | Status: AC
  Administered 2017-08-30: 1000 mg via INTRAVENOUS

## 2017-08-30 MED ORDER — DEXAMETHASONE SODIUM PHOSPHATE 10 MG/ML IJ SOLN
INTRAMUSCULAR | Status: AC
  Filled 2017-08-30: qty 1

## 2017-08-30 MED ORDER — CEFAZOLIN SODIUM-DEXTROSE 1-4 GM/50ML-% IV SOLN
1.0000 g | Freq: Four times a day (QID) | INTRAVENOUS | Status: AC
  Administered 2017-08-30 – 2017-08-31 (×2): 1 g via INTRAVENOUS
  Filled 2017-08-30 (×2): qty 50

## 2017-08-30 MED ORDER — BUPIVACAINE HCL (PF) 0.25 % IJ SOLN
INTRAMUSCULAR | Status: DC | PRN
  Administered 2017-08-30: 30 mL

## 2017-08-30 MED ORDER — FENTANYL CITRATE (PF) 100 MCG/2ML IJ SOLN: 25.0000 ug | INTRAMUSCULAR | Status: DC | PRN

## 2017-08-30 MED ORDER — PHENYLEPHRINE 40 MCG/ML (10ML) SYRINGE FOR IV PUSH (FOR BLOOD PRESSURE SUPPORT)
PREFILLED_SYRINGE | INTRAVENOUS | Status: DC | PRN
  Administered 2017-08-30 (×3): 80 ug via INTRAVENOUS

## 2017-08-30 MED ORDER — DIPHENHYDRAMINE HCL 12.5 MG/5ML PO ELIX: 12.5000 mg | ORAL_SOLUTION | ORAL | Status: DC | PRN

## 2017-08-30 MED ORDER — CHLORHEXIDINE GLUCONATE 4 % EX LIQD: 60.0000 mL | Freq: Once | CUTANEOUS | Status: DC

## 2017-08-30 MED ORDER — ACETAMINOPHEN 10 MG/ML IV SOLN
INTRAVENOUS | Status: AC
  Filled 2017-08-30: qty 100

## 2017-08-30 MED ORDER — METOCLOPRAMIDE HCL 5 MG/ML IJ SOLN: 5.0000 mg | Freq: Three times a day (TID) | INTRAMUSCULAR | Status: DC | PRN

## 2017-08-30 MED ORDER — ONDANSETRON HCL 4 MG/2ML IJ SOLN
4.0000 mg | Freq: Four times a day (QID) | INTRAMUSCULAR | Status: DC | PRN
  Administered 2017-09-01: 4 mg via INTRAVENOUS
  Filled 2017-08-30: qty 2

## 2017-08-30 MED ORDER — POLYETHYLENE GLYCOL 3350 17 G PO PACK: 17.0000 g | PACK | Freq: Every day | ORAL | Status: DC | PRN

## 2017-08-30 MED ORDER — SODIUM CHLORIDE 0.9 % IV SOLN
INTRAVENOUS | Status: DC
  Administered 2017-08-30: 18:00:00 via INTRAVENOUS

## 2017-08-30 MED ORDER — ONDANSETRON HCL 4 MG/2ML IJ SOLN
INTRAMUSCULAR | Status: DC | PRN
  Administered 2017-08-30: 4 mg via INTRAVENOUS

## 2017-08-30 MED ORDER — ACETAMINOPHEN 325 MG PO TABS
650.0000 mg | ORAL_TABLET | Freq: Four times a day (QID) | ORAL | Status: DC | PRN
  Administered 2017-09-01: 650 mg via ORAL
  Filled 2017-08-30: qty 2

## 2017-08-30 MED ORDER — BUPIVACAINE IN DEXTROSE 0.75-8.25 % IT SOLN
INTRATHECAL | Status: DC | PRN
  Administered 2017-08-30: 1.6 mL via INTRATHECAL

## 2017-08-30 MED ORDER — OXYCODONE HCL 5 MG PO TABS
5.0000 mg | ORAL_TABLET | ORAL | Status: DC | PRN
  Administered 2017-08-30: 5 mg via ORAL
  Administered 2017-08-31 – 2017-09-01 (×6): 10 mg via ORAL
  Filled 2017-08-30 (×2): qty 2
  Filled 2017-08-30: qty 1
  Filled 2017-08-30 (×5): qty 2
  Filled 2017-08-30: qty 1

## 2017-08-30 MED ORDER — FENTANYL CITRATE (PF) 100 MCG/2ML IJ SOLN
INTRAMUSCULAR | Status: DC | PRN
  Administered 2017-08-30 (×2): 50 ug via INTRAVENOUS

## 2017-08-30 SURGICAL SUPPLY — 35 items
BAG ZIPLOCK 12X15 (MISCELLANEOUS) ×2
BLADE SAG 18X100X1.27 (BLADE) ×2
CAPT HIP TOTAL 2 ×2 IMPLANT
CLOTH BEACON ORANGE TIMEOUT ST (SAFETY) ×2
COVER PERINEAL POST (MISCELLANEOUS) ×2
COVER SURGICAL LIGHT HANDLE (MISCELLANEOUS) ×2
DECANTER SPIKE VIAL GLASS SM (MISCELLANEOUS) ×2
DRAPE STERI IOBAN 125X83 (DRAPES) ×2
DRAPE U-SHAPE 47X51 STRL (DRAPES) ×4
DRSG ADAPTIC 3X8 NADH LF (GAUZE/BANDAGES/DRESSINGS) ×2
DRSG MEPILEX BORDER 4X4 (GAUZE/BANDAGES/DRESSINGS) ×2
DRSG MEPILEX BORDER 4X8 (GAUZE/BANDAGES/DRESSINGS) ×2
DURAPREP 26ML APPLICATOR (WOUND CARE) ×2
ELECT REM PT RETURN 15FT ADLT (MISCELLANEOUS) ×2
EVACUATOR 1/8 PVC DRAIN (DRAIN) ×2
GLOVE BIO SURGEON STRL SZ7.5 (GLOVE) ×2
GLOVE BIO SURGEON STRL SZ8 (GLOVE) ×4
GLOVE BIOGEL PI IND STRL 7.5 (GLOVE) ×4
GLOVE BIOGEL PI IND STRL 8 (GLOVE) ×2
GLOVE BIOGEL PI INDICATOR 7.5 (GLOVE) ×4
GLOVE BIOGEL PI INDICATOR 8 (GLOVE) ×2
GLOVE SURG SS PI 7.0 STRL IVOR (GLOVE) ×2
GLOVE SURG SS PI 7.5 STRL IVOR (GLOVE) ×2
GOWN SPEC L3 XXLG W/TWL (GOWN DISPOSABLE) ×2
GOWN STRL REUS W/TWL LRG LVL3 (GOWN DISPOSABLE) ×2
GOWN STRL REUS W/TWL XL LVL3 (GOWN DISPOSABLE) ×4
PACK ANTERIOR HIP CUSTOM (KITS) ×2
STRIP CLOSURE SKIN 1/2X4 (GAUZE/BANDAGES/DRESSINGS) ×4
SUT ETHIBOND NAB CT1 #1 30IN (SUTURE) ×2
SUT MNCRL AB 4-0 PS2 18 (SUTURE) ×2
SUT STRATAFIX 0 PDS 27 VIOLET (SUTURE) ×2
SUT VIC AB 2-0 CT1 27 (SUTURE) ×2
SUT VIC AB 2-0 CT1 TAPERPNT 27 (SUTURE) ×2
TRAY FOLEY CATH SILVER 14FR (SET/KITS/TRAYS/PACK) ×2
YANKAUER SUCT BULB TIP 10FT TU (MISCELLANEOUS) ×2

## 2017-08-30 NOTE — Transfer of Care (Signed)
Immediate Anesthesia Transfer of Care Note  Patient: Pamela Price  Procedure(s) Performed: Procedure(s): RIGHT TOTAL HIP ARTHROPLASTY ANTERIOR APPROACH (Right)  Patient Location: PACU  Anesthesia Type:Spinal  Level of Consciousness:  sedated, patient cooperative and responds to stimulation  Airway & Oxygen Therapy:Patient Spontanous Breathing and Patient connected to face mask oxgen  Post-op Assessment:  Report given to PACU RN and Post -op Vital signs reviewed and stable  Post vital signs:  Reviewed and stable  Last Vitals:  Vitals:   08/30/17 1311  BP: (!) 174/70  Pulse: 71  Resp: 16  Temp: 36.8 C  SpO2: 681%    Complications: No apparent anesthesia complications

## 2017-08-30 NOTE — Op Note (Signed)
OPERATIVE REPORT- TOTAL HIP ARTHROPLASTY   PREOPERATIVE DIAGNOSIS: Osteoarthritis of the Right hip.   POSTOPERATIVE DIAGNOSIS: Osteoarthritis of the Right  hip.   PROCEDURE: Right total hip arthroplasty, anterior approach.   SURGEON: Gaynelle Arabian, MD   ASSISTANT: Arlee Muslim, PA-C  ANESTHESIA:  Spinal  ESTIMATED BLOOD LOSS:-350 ml    DRAINS: Hemovac x1.   COMPLICATIONS: None   CONDITION: PACU - hemodynamically stable.   BRIEF CLINICAL NOTE: Pamela Price is a 83 y.o. female who has advanced end-  stage arthritis of their Right  hip with progressively worsening pain and  dysfunction.The patient has failed nonoperative management and presents for  total hip arthroplasty.   PROCEDURE IN DETAIL: After successful administration of spinal  anesthetic, the traction boots for the [facility] bed were placed on both  feet and the patient was placed onto the [facility] bed, boots placed into the leg  holders. The Right hip was then isolated from the perineum with plastic  drapes and prepped and draped in the usual sterile fashion. ASIS and  greater trochanter were marked and a oblique incision was made, starting  at about 1 cm lateral and 2 cm distal to the ASIS and coursing towards  the anterior cortex of the femur. The skin was cut with a 10 blade  through subcutaneous tissue to the level of the fascia overlying the  tensor fascia lata muscle. The fascia was then incised in line with the  incision at the junction of the anterior third and posterior 2/3rd. The  muscle was teased off the fascia and then the interval between the TFL  and the rectus was developed. The Hohmann retractor was then placed at  the top of the femoral neck over the capsule. The vessels overlying the  capsule were cauterized and the fat on top of the capsule was removed.  A Hohmann retractor was then placed anterior underneath the rectus  femoris to give exposure to the entire anterior capsule. A T-shaped   capsulotomy was performed. The edges were tagged and the femoral head  was identified.       Osteophytes are removed off the superior acetabulum.  The femoral neck was then cut in situ with an oscillating saw. Traction  was then applied to the left lower extremity utilizing the [facility]  traction. The femoral head was then removed. Retractors were placed  around the acetabulum and then circumferential removal of the labrum was  performed. Osteophytes were also removed. Reaming starts at 45 mm to  medialize and  Increased in 2 mm increments to 49 mm. We reamed in  approximately 40 degrees of abduction, 20 degrees anteversion. A 50 mm  pinnacle acetabular shell was then impacted in anatomic position under  fluoroscopic guidance with excellent purchase. We did not need to place  any additional dome screws. A 32 mm neutral + 4 marathon liner was then  placed into the acetabular shell.       The femoral lift was then placed along the lateral aspect of the femur  just distal to the vastus ridge. The leg was  externally rotated and capsule  was stripped off the inferior aspect of the femoral neck down to the  level of the lesser trochanter, this was done with electrocautery. The femur was lifted after this was performed. The  leg was then placed in an extended and adducted position essentially delivering the femur. We also removed the capsule superiorly and the piriformis from the piriformis  fossa to gain excellent exposure of the  proximal femur. Rongeur was used to remove some cancellous bone to get  into the lateral portion of the proximal femur for placement of the  initial starter reamer. The starter broaches was placed  the starter broach  and was shown to go down the center of the canal. Broaching  with the  Corail system was then performed starting at size 8, coursing  Up to size 13. A size 13 had excellent torsional and rotational  and axial stability. The trial high offset neck was then  placed  with a 32 + 1 trial head. The hip was then reduced. We confirmed that  the stem was in the canal both on AP and lateral x-rays. It also has excellent sizing. The hip was reduced with outstanding stability through full extension and full external rotation.. AP pelvis was taken and the leg lengths were measured and found to be equal. Hip was then dislocated again and the femoral head and neck removed. The  femoral broach was removed. Size 13 Corail stem with a high offset  neck was then impacted into the femur following native anteversion. Has  excellent purchase in the canal. Excellent torsional and rotational and  axial stability. It is confirmed to be in the canal on AP and lateral  fluoroscopic views. The 32 + 1 ceramic head was placed and the hip  reduced with outstanding stability. Again AP pelvis was taken and it  confirmed that the leg lengths were equal. The wound was then copiously  irrigated with saline solution and the capsule reattached and repaired  with Ethibond suture. 30 ml of .25% Bupivicaine was  injected into the capsule and into the edge of the tensor fascia lata as well as subcutaneous tissue. The fascia overlying the tensor fascia lata was then closed with a running #1 V-Loc. Subcu was closed with interrupted 2-0 Vicryl and subcuticular running 4-0 Monocryl. Incision was cleaned  and dried. Steri-Strips and a bulky sterile dressing applied. Hemovac  drain was hooked to suction and then the patient was awakened and transported to  recovery in stable condition.        Please note that a surgical assistant was a medical necessity for this procedure to perform it in a safe and expeditious manner. Assistant was necessary to provide appropriate retraction of vital neurovascular structures and to prevent femoral fracture and allow for anatomic placement of the prosthesis.  Gaynelle Arabian, M.D.

## 2017-08-30 NOTE — Anesthesia Postprocedure Evaluation (Signed)
Anesthesia Post Note  Patient: Pamela Price  Procedure(s) Performed: Procedure(s) (LRB): RIGHT TOTAL HIP ARTHROPLASTY ANTERIOR APPROACH (Right)     Patient location during evaluation: PACU Anesthesia Type: Spinal Level of consciousness: awake and alert Pain management: pain level controlled Vital Signs Assessment: post-procedure vital signs reviewed and stable Respiratory status: spontaneous breathing and respiratory function stable Cardiovascular status: blood pressure returned to baseline and stable Postop Assessment: spinal receding Anesthetic complications: no    Last Vitals:  Vitals:   08/30/17 1700 08/30/17 1730  BP:    Pulse: 60   Resp:    Temp:  36.6 C  SpO2:      Last Pain:  Vitals:   08/30/17 1311  TempSrc: Oral    LLE Motor Response: Purposeful movement (08/30/17 1730) LLE Sensation: Tingling;No pain (08/30/17 1730) RLE Motor Response: Purposeful movement (08/30/17 1730) RLE Sensation: Tingling;No pain (08/30/17 1730) L Sensory Level: S1-Sole of foot, small toes (08/30/17 1730) R Sensory Level: S1-Sole of foot, small toes (08/30/17 1730)  Whitfield Dulay,W. EDMOND

## 2017-08-30 NOTE — Anesthesia Procedure Notes (Signed)
Spinal  Patient location during procedure: OR Start time: 08/30/2017 2:44 PM End time: 08/30/2017 2:47 PM Staffing Anesthesiologist: Josephine Igo Resident/CRNA: Darlys Gales R Performed: resident/CRNA  Preanesthetic Checklist Completed: patient identified, site marked, surgical consent, pre-op evaluation, timeout performed, IV checked, risks and benefits discussed and monitors and equipment checked Spinal Block Patient position: sitting Prep: DuraPrep Patient monitoring: heart rate, cardiac monitor, continuous pulse ox and blood pressure Approach: midline Location: L3-4 Injection technique: single-shot Needle Needle type: Pencan  Needle gauge: 24 G Needle length: 10 cm Needle insertion depth: 6 cm Assessment Sensory level: T6

## 2017-08-30 NOTE — Interval H&P Note (Signed)
History and Physical Interval Note:  08/30/2017 12:41 PM  Pamela Price  has presented today for surgery, with the diagnosis of Osteoarthritis Right Hip  The various methods of treatment have been discussed with the patient and family. After consideration of risks, benefits and other options for treatment, the patient has consented to  Procedure(s): RIGHT TOTAL HIP ARTHROPLASTY ANTERIOR APPROACH (Right) as a surgical intervention .  The patient's history has been reviewed, patient examined, no change in status, stable for surgery.  I have reviewed the patient's chart and labs.  Questions were answered to the patient's satisfaction.     Gearlean Alf

## 2017-08-30 NOTE — Anesthesia Preprocedure Evaluation (Signed)
Anesthesia Evaluation  Patient identified by MRN, date of birth, ID band Patient awake    Reviewed: Allergy & Precautions, NPO status , Patient's Chart, lab work & pertinent test results  Airway Mallampati: II  TM Distance: >3 FB Neck ROM: Full    Dental no notable dental hx. (+) Teeth Intact   Pulmonary former smoker,    Pulmonary exam normal breath sounds clear to auscultation       Cardiovascular hypertension, Pt. on medications + Peripheral Vascular Disease  Normal cardiovascular exam Rhythm:Regular Rate:Normal     Neuro/Psych negative neurological ROS  negative psych ROS   GI/Hepatic negative GI ROS, Neg liver ROS,   Endo/Other  Hyperlipdemia  Renal/GU negative Renal ROS  negative genitourinary   Musculoskeletal  (+) Arthritis , Osteoarthritis,  OA right hip   Abdominal   Peds  Hematology negative hematology ROS (+)   Anesthesia Other Findings   Reproductive/Obstetrics                             Anesthesia Physical Anesthesia Plan  ASA: II  Anesthesia Plan: Spinal   Post-op Pain Management:    Induction:   PONV Risk Score and Plan: 4 or greater and Ondansetron, Dexamethasone, Midazolam, Propofol infusion and Metaclopromide  Airway Management Planned: Natural Airway and Simple Face Mask  Additional Equipment:   Intra-op Plan:   Post-operative Plan:   Informed Consent: I have reviewed the patients History and Physical, chart, labs and discussed the procedure including the risks, benefits and alternatives for the proposed anesthesia with the patient or authorized representative who has indicated his/her understanding and acceptance.   Dental advisory given  Plan Discussed with: CRNA, Anesthesiologist and Surgeon  Anesthesia Plan Comments:         Anesthesia Quick Evaluation

## 2017-08-30 NOTE — H&P (View-Only) (Signed)
Pamela Price DOB: 1933/11/16 Married / Language: English / Race: White Female Date of admission: August 30, 2017  Chief complaint: Right hip pain History of Present Illness  The patient is a 83 year old female who comes in for a preoperative History and Physical. The patient is scheduled for a right total hip arthroplasty (anterior) to be performed by Dr. Dione Plover. Aluisio, MD at [facility] on 08/30/2017. The patient is a 83 year old female who presented with a hip problem. The patient was seen for a second opinion.The patient reports right hip problems including pain symptoms that have been present for 1 year(s). The symptoms began without any known injury. Symptoms reported include hip pain, stiffness and difficulty flexing hip The patient reports symptoms radiating to the: right thigh and right knee. The patient describes the hip problem as aching. Onset of symptoms was gradual with symptoms now occuring frequently.The symptoms are described as mild.The patient feels as if their symptoms are does feel they are worsening. Symptoms are exacerbated by squatting and sitting. Symptoms are relieved by stretching. Current treatment includes nonsteroidal anti-inflammatory drugs (Mobic bid). Prior to being seen, the patient was previously evaluated by a colleague (Dr. Ian Malkin at [place]). Previous workup for this problem has included hip x-rays. Previous treatment for this problem has included none (other than the Mobic). she is having considerable pain in right hip and even worse of the functional limitations that she is experiencing. The hip feels unstable to her and she feels as though she is going to fall. She has pain with all activities but she said she can tolerate the pain. She cannot tolerate the dysfunction and inability to do think she desires. She also cannot tolerate the unstable feeling of the hip. She does not have night pain at this point. She is unable to do  things that she desires to do. She is ready to get the hip fixed at this time. They have been treated conservatively in the past for the above stated problem and despite conservative measures, they continue to have progressive pain and severe functional limitations and dysfunction. They have failed non-operative management including home exercise, medications. It is felt that they would benefit from undergoing total joint replacement. Risks and benefits of the procedure have been discussed with the patient and they elect to proceed with surgery. There are no active contraindications to surgery such as ongoing infection or rapidly progressive neurological disease.    Problem List/Past Medical  Primary osteoarthritis of right hip (M16.11)  High blood pressure  Hypercholesterolemia  Osteoporosis  Osteoarthritis  Polio  Childhood Illness Macular Degeneration   Allergies SulfADIAZINE *Sulfonamides**  Rash.  Family History Cancer  Father, Mother.  Social History Children  3 Current work status  retired Furniture conservator/restorer weekly; does running / walking Living situation  live with spouse Marital status  married Never consumed alcohol  06/08/2017: Never consumed alcohol No history of drug/alcohol rehab  Not under pain contract  Number of flights of stairs before winded  4-5 Tobacco use  Never smoker. 06/08/2017  Medication History  Mobic (7.5MG  Tablet, Oral two times daily) Active. Lisinopril (Oral) Specific strength unknown - Active. Pravastatin Sodium (Oral) Specific strength unknown - Active. Alendronate Sodium (Oral) Specific strength unknown - Active. Eye Vitamins (Oral) Active. Aspirin (81MG  Tablet, Oral) Active. Calcium Active.   Past Surgical History  Benign Tumor Excision Left Chest Wall     Review of Systems General Not Present- Chills, Fatigue, Fever, Memory Loss,  Night Sweats, Weight Gain and Weight Loss. Skin Not Present- Eczema,  Hives, Itching, Lesions and Rash. HEENT Not Present- Dentures, Double Vision, Headache, Hearing Loss, Tinnitus and Visual Loss. Respiratory Not Present- Allergies, Chronic Cough, Coughing up blood, Shortness of breath at rest and Shortness of breath with exertion. Cardiovascular Not Present- Chest Pain, Difficulty Breathing Lying Down, Murmur, Palpitations, Racing/skipping heartbeats and Swelling. Gastrointestinal Not Present- Abdominal Pain, Bloody Stool, Constipation, Diarrhea, Difficulty Swallowing, Heartburn, Jaundice, Loss of appetitie, Nausea and Vomiting. Female Genitourinary Not Present- Blood in Urine, Discharge, Flank Pain, Incontinence, Painful Urination, Urgency, Urinary frequency, Urinary Retention, Urinating at Night and Weak urinary stream. Musculoskeletal Present- Joint Pain. Not Present- Back Pain, Joint Swelling, Morning Stiffness, Muscle Pain, Muscle Weakness and Spasms. Neurological Not Present- Blackout spells, Difficulty with balance, Dizziness, Paralysis, Tremor and Weakness. Psychiatric Not Present- Insomnia.  Vitals Weight: 113 lb Height: 62in Weight was reported by patient. Height was reported by patient. Body Surface Area: 1.5 m Body Mass Index: 20.67 kg/m  Pulse: 76 (Regular)  BP: 144/70 (Sitting, Right Arm, Standard)    Physical Exam  General Mental Status -Alert, cooperative and good historian. General Appearance-pleasant, Not in acute distress. Orientation-Oriented X3. Build & Nutrition-Well nourished and Well developed.  Head and Neck Head-normocephalic, atraumatic . Neck Global Assessment - supple, no bruit auscultated on the right, no bruit auscultated on the left.  Eye Pupil - Bilateral-Regular and Round. Motion - Bilateral-EOMI.  Chest and Lung Exam Auscultation Breath sounds - clear at anterior chest wall and clear at posterior chest wall. Adventitious sounds - No Adventitious  sounds.  Cardiovascular Auscultation Rhythm - Regular rate and rhythm. Heart Sounds - S1 WNL and S2 WNL. Murmurs & Other Heart Sounds - Auscultation of the heart reveals - No Murmurs.  Abdomen Palpation/Percussion Tenderness - Abdomen is non-tender to palpation. Rigidity (guarding) - Abdomen is soft. Auscultation Auscultation of the abdomen reveals - Bowel sounds normal.  Female Genitourinary Note: Not done, not pertinent to present illness   Musculoskeletal Note: Evaluation of the left hip shows flexion to 120 rotation in 30 out 40 and abduction 40 without discomfort. There is no tenderness over the greater trochanter. There is no pain on provocative testing of the hip. Her right hip shows flexion to 100 no internal rotation approximately 10 of external rotation and approximately 20 of abduction. she does not have any tenderness about the right hip. She has a significantly antalgic gait pattern on the right. Knee exams are unremarkable. Pulses are trace palpable with intact sensation and motor in both lower extremities. Her radiographs AP pelvis and lateral of the right hip show severe bone-on-bone arthritis in the right hip with subchondral cystic formation and osteophyte formation.   Assessment & Plan  Primary osteoarthritis of right hip (M16.11)  Note:Surgical Plans: Right Total Hip Replacement - Anterior Approach  Disposition: Home with family  PCP: Dr. Karren Cobble - pending at time of H&P  IV TXA  Anesthesia Issues: None  Patient was instructed on what medications to stop prior to surgery.  Signed electronically by Joelene Millin, III PA-C

## 2017-08-31 LAB — CBC
HCT: 27.7 % — ABNORMAL LOW (ref 36.0–46.0)
Hemoglobin: 9.4 g/dL — ABNORMAL LOW (ref 12.0–15.0)
MCH: 28.7 pg (ref 26.0–34.0)
MCHC: 33.9 g/dL (ref 30.0–36.0)
MCV: 84.7 fL (ref 78.0–100.0)
Platelets: 196 10*3/uL (ref 150–400)
RBC: 3.27 MIL/uL — ABNORMAL LOW (ref 3.87–5.11)
RDW: 13.8 % (ref 11.5–15.5)
WBC: 11.5 10*3/uL — ABNORMAL HIGH (ref 4.0–10.5)

## 2017-08-31 LAB — BASIC METABOLIC PANEL WITH GFR
Anion gap: 6 (ref 5–15)
BUN: 16 mg/dL (ref 6–20)
CO2: 24 mmol/L (ref 22–32)
Calcium: 8.6 mg/dL — ABNORMAL LOW (ref 8.9–10.3)
Chloride: 106 mmol/L (ref 101–111)
Creatinine, Ser: 0.64 mg/dL (ref 0.44–1.00)
GFR calc Af Amer: >60 mL/min
GFR calc non Af Amer: >60 mL/min
Glucose, Bld: 126 mg/dL — ABNORMAL HIGH (ref 65–99)
Potassium: 4.2 mmol/L (ref 3.5–5.1)
Sodium: 136 mmol/L (ref 135–145)

## 2017-08-31 MED ORDER — OXYCODONE HCL 5 MG PO TABS: 5.0000 mg | ORAL_TABLET | ORAL | 0 refills | Status: DC | PRN

## 2017-08-31 MED ORDER — SODIUM CHLORIDE 0.9 % IV BOLUS (SEPSIS)
250.0000 mL | Freq: Once | INTRAVENOUS | Status: AC
  Administered 2017-08-31: 250 mL via INTRAVENOUS

## 2017-08-31 MED ORDER — RIVAROXABAN 10 MG PO TABS: 10.0000 mg | ORAL_TABLET | Freq: Every day | ORAL | 0 refills | Status: DC

## 2017-08-31 MED ORDER — METHOCARBAMOL 500 MG PO TABS: 500.0000 mg | ORAL_TABLET | Freq: Four times a day (QID) | ORAL | 0 refills | Status: DC | PRN

## 2017-08-31 MED ORDER — TRAMADOL HCL 50 MG PO TABS: 50.0000 mg | ORAL_TABLET | Freq: Four times a day (QID) | ORAL | 0 refills | Status: DC | PRN

## 2017-08-31 MED ORDER — POLYSACCHARIDE IRON COMPLEX 150 MG PO CAPS: 150.0000 mg | ORAL_CAPSULE | Freq: Every day | ORAL | 0 refills | Status: DC

## 2017-08-31 MED ORDER — POLYSACCHARIDE IRON COMPLEX 150 MG PO CAPS
150.0000 mg | ORAL_CAPSULE | Freq: Every day | ORAL | Status: DC
  Administered 2017-08-31 – 2017-09-01 (×2): 150 mg via ORAL
  Filled 2017-08-31 (×2): qty 1

## 2017-08-31 NOTE — Discharge Summary (Signed)
Physician Discharge Summary   Patient ID: Pamela Price MRN: [id number] DOB/AGE: [birth year] 83 y.o.  Admit date: 08/30/2017 Discharge date: 09/01/2017  Primary Diagnosis:  Right total hip arthroplasty, anterior approach.   Admission Diagnoses:  Past Medical History:  Diagnosis Date  . Arthritis   . HLD (hyperlipidemia)   . Hypertension   . Macular degeneration    bilateral   . Osteoporosis   . Varicose veins of both lower extremities    Discharge Diagnoses:   Principal Problem:   OA (osteoarthritis) of hip  Estimated body mass index is 21.87 kg/m as calculated from the following:   Height as of this encounter: 5' (1.524 m).   Weight as of this encounter: 50.8 kg (112 lb).  Procedure(s) (LRB): RIGHT TOTAL HIP ARTHROPLASTY ANTERIOR APPROACH (Right)   Consults: None  HPI: Pamela Price is a 83 y.o. female who has advanced end-  stage arthritis of their Right  hip with progressively worsening pain and  dysfunction.The patient has failed nonoperative management and presents for  total hip arthroplasty.   Laboratory Data: Admission on 08/30/2017  Component Date Value Ref Range Status  . WBC 08/30/2017 15.0* 4.0 - 10.5 K/uL Final  . RBC 08/30/2017 3.71* 3.87 - 5.11 MIL/uL Final  . Hemoglobin 08/30/2017 10.6* 12.0 - 15.0 g/dL Final  . HCT 08/30/2017 31.4* 36.0 - 46.0 % Final  . MCV 08/30/2017 84.6  78.0 - 100.0 fL Final  . MCH 08/30/2017 28.6  26.0 - 34.0 pg Final  . MCHC 08/30/2017 33.8  30.0 - 36.0 g/dL Final  . RDW 08/30/2017 13.8  11.5 - 15.5 % Final  . Platelets 08/30/2017 208  150 - 400 K/uL Final  . Sodium 08/30/2017 137  135 - 145 mmol/L Final  . Potassium 08/30/2017 4.1  3.5 - 5.1 mmol/L Final  . Chloride 08/30/2017 105  101 - 111 mmol/L Final  . CO2 08/30/2017 24  22 - 32 mmol/L Final  . Glucose, Bld 08/30/2017 125* 65 - 99 mg/dL Final  . BUN 08/30/2017 16  6 - 20 mg/dL Final  . Creatinine, Ser 08/30/2017 0.62  0.44 - 1.00 mg/dL Final  . Calcium  08/30/2017 8.9  8.9 - 10.3 mg/dL Final  . Total Protein 08/30/2017 6.3* 6.5 - 8.1 g/dL Final  . Albumin 08/30/2017 3.7  3.5 - 5.0 g/dL Final  . AST 08/30/2017 20  15 - 41 U/L Final  . ALT 08/30/2017 15  14 - 54 U/L Final  . Alkaline Phosphatase 08/30/2017 56  38 - 126 U/L Final  . Total Bilirubin 08/30/2017 0.7  0.3 - 1.2 mg/dL Final  . GFR calc non Af Amer 08/30/2017 >60  >60 mL/min Final  . GFR calc Af Amer 08/30/2017 >60  >60 mL/min Final   Comment: (NOTE) The eGFR has been calculated using the CKD EPI equation. This calculation has not been validated in all clinical situations. eGFR's persistently <60 mL/min signify possible Chronic Kidney Disease.   . Anion gap 08/30/2017 8  5 - 15 Final  . WBC 08/31/2017 11.5* 4.0 - 10.5 K/uL Final  . RBC 08/31/2017 3.27* 3.87 - 5.11 MIL/uL Final  . Hemoglobin 08/31/2017 9.4* 12.0 - 15.0 g/dL Final  . HCT 08/31/2017 27.7* 36.0 - 46.0 % Final  . MCV 08/31/2017 84.7  78.0 - 100.0 fL Final  . MCH 08/31/2017 28.7  26.0 - 34.0 pg Final  . MCHC 08/31/2017 33.9  30.0 - 36.0 g/dL Final  . RDW 08/31/2017 13.8  11.5 -  15.5 % Final  . Platelets 08/31/2017 196  150 - 400 K/uL Final  . Sodium 08/31/2017 136  135 - 145 mmol/L Final  . Potassium 08/31/2017 4.2  3.5 - 5.1 mmol/L Final  . Chloride 08/31/2017 106  101 - 111 mmol/L Final  . CO2 08/31/2017 24  22 - 32 mmol/L Final  . Glucose, Bld 08/31/2017 126* 65 - 99 mg/dL Final  . BUN 08/31/2017 16  6 - 20 mg/dL Final  . Creatinine, Ser 08/31/2017 0.64  0.44 - 1.00 mg/dL Final  . Calcium 08/31/2017 8.6* 8.9 - 10.3 mg/dL Final  . GFR calc non Af Amer 08/31/2017 >60  >60 mL/min Final  . GFR calc Af Amer 08/31/2017 >60  >60 mL/min Final   Comment: (NOTE) The eGFR has been calculated using the CKD EPI equation. This calculation has not been validated in all clinical situations. eGFR's persistently <60 mL/min signify possible Chronic Kidney Disease.   Georgiann Hahn gap 08/31/2017 6  5 - 15 Final  Hospital  Outpatient Visit on 08/24/2017  Component Date Value Ref Range Status  . aPTT 08/24/2017 31  24 - 36 seconds Final  . Prothrombin Time 08/24/2017 13.2  11.4 - 15.2 seconds Final  . INR 08/24/2017 1.01   Final  . ABO/RH(D) 08/24/2017 A POS   Final  . Antibody Screen 08/24/2017 NEG   Final  . Sample Expiration 08/24/2017 09/01/2017   Final  . Extend sample reason 08/24/2017 NO TRANSFUSIONS OR PREGNANCY IN THE PAST 3 MONTHS   Final  . ABO/RH(D) 08/24/2017 A POS   Final     X-Rays:Dg Pelvis Portable  Result Date: 08/30/2017 CLINICAL DATA:  Status post hip arthroplasty EXAM: PORTABLE PELVIS 1-2 VIEWS COMPARISON:  None. FINDINGS: Status post right total hip arthroplasty, in satisfactory position. Associated subcutaneous gas. Mild degenerative changes of the left hip. Visualized bony pelvis appears intact. IMPRESSION: Status post right total hip arthroplasty, in satisfactory position. Electronically Signed   By: Julian Hy M.D.   On: 08/30/2017 20:03   Dg C-arm 1-60 Min-no Report  Result Date: 08/30/2017 Fluoroscopy was utilized by the requesting physician.  No radiographic interpretation.    EKG:No orders found for this or any previous visit.   Hospital Course: Patient was admitted to [facility] and taken to the OR and underwent the above state procedure without complications.  Patient tolerated the procedure well and was later transferred to the recovery room and then to the orthopaedic floor for postoperative care.  They were given PO and IV analgesics for pain control following their surgery.  They were given 24 hours of postoperative antibiotics of  Anti-infectives    Start     Dose/Rate Route Frequency Ordered Stop   08/31/17 0600  ceFAZolin (ANCEF) IVPB 2g/100 mL premix     2 g 200 mL/hr over 30 Minutes Intravenous On call to O.R. 08/30/17 1251 08/30/17 1454   08/30/17 2100  ceFAZolin (ANCEF) IVPB 1 g/50 mL premix     1 g 100 mL/hr over 30 Minutes Intravenous Every 6  hours 08/30/17 1801 08/31/17 0425   08/30/17 1257  ceFAZolin (ANCEF) 2-4 GM/100ML-% IVPB    Comments:  Waldron Session   : cabinet override      08/30/17 1257 08/30/17 1449     and started on DVT prophylaxis in the form of Xarelto.   PT and OT were ordered for total hip protocol.  The patient was allowed to be WBAT with therapy. Discharge planning was consulted to  help with postop disposition and equipment needs.  Patient had a decent night on the evening of surgery.  They started to get up OOB with therapy on day one.  Hemovac drain was pulled without difficulty.  Continued to work with therapy into day two.  Dressing was changed on day two and the incision was healing well. Patient was seen in rounds on POD 2 by Dr. Wynelle Link and was ready to go home.  Discharge home with home health verus outpatient therapy Diet - Cardiac diet Follow up - in 2 weeks Activity - WBAT Disposition - Home Condition Upon Discharge - Good D/C Meds - See DC Summary DVT Prophylaxis - Xarelto    Discharge Instructions    Call MD / Call 911    Complete by:  As directed    If you experience chest pain or shortness of breath, CALL 911 and be transported to the hospital emergency room.  If you develope a fever above 101 F, pus (white drainage) or increased drainage or redness at the wound, or calf pain, call your surgeon's office.   Change dressing    Complete by:  As directed    You may change your dressing dressing daily with sterile 4 x 4 inch gauze dressing and paper tape.  Do not submerge the incision under water.   Constipation Prevention    Complete by:  As directed    Drink plenty of fluids.  Prune juice may be helpful.  You may use a stool softener, such as Colace (over the counter) 100 mg twice a day.  Use MiraLax (over the counter) for constipation as needed.   Diet - low sodium heart healthy    Complete by:  As directed    Discharge instructions    Complete by:  As directed    Take Xarelto for two and a  half more weeks, then discontinue Xarelto. Once the patient has completed the Xarelto, they may resume the 81 mg Aspirin.  Pick up stool softner and laxative for home use following surgery while on pain medications. Do not submerge incision under water. Please use good hand washing techniques while changing dressing each day. May shower starting three days after surgery. Please use a clean towel to pat the incision dry following showers. Continue to use ice for pain and swelling after surgery. Do not use any lotions or creams on the incision until instructed by your surgeon.  Wear both TED hose on both legs during the day every day for three weeks, but may remove the TED hose at night at home.  Postoperative Constipation Protocol  Constipation - defined medically as fewer than three stools per week and severe constipation as less than one stool per week.  One of the most common issues patients have following surgery is constipation.  Even if you have a regular bowel pattern at home, your normal regimen is likely to be disrupted due to multiple reasons following surgery.  Combination of anesthesia, postoperative narcotics, change in appetite and fluid intake all can affect your bowels.  In order to avoid complications following surgery, here are some recommendations in order to help you during your recovery period.  Colace (docusate) - Pick up an over-the-counter form of Colace or another stool softener and take twice a day as long as you are requiring postoperative pain medications.  Take with a full glass of water daily.  If you experience loose stools or diarrhea, hold the colace until you stool forms back up.  If your symptoms do not get better within 1 week or if they get worse, check with your doctor.  Dulcolax (bisacodyl) - Pick up over-the-counter and take as directed by the product packaging as needed to assist with the movement of your bowels.  Take with a full glass of water.  Use this  product as needed if not relieved by Colace only.   MiraLax (polyethylene glycol) - Pick up over-the-counter to have on hand.  MiraLax is a solution that will increase the amount of water in your bowels to assist with bowel movements.  Take as directed and can mix with a glass of water, juice, soda, coffee, or tea.  Take if you go more than two days without a movement. Do not use MiraLax more than once per day. Call your doctor if you are still constipated or irregular after using this medication for 7 days in a row.  If you continue to have problems with postoperative constipation, please contact the office for further assistance and recommendations.  If you experience "the worst abdominal pain ever" or develop nausea or vomiting, please contact the office immediatly for further recommendations for treatment.   Do not sit on low chairs, stoools or toilet seats, as it may be difficult to get up from low surfaces    Complete by:  As directed    Driving restrictions    Complete by:  As directed    No driving until released by the physician.   Increase activity slowly as tolerated    Complete by:  As directed    Lifting restrictions    Complete by:  As directed    No lifting until released by the physician.   Patient may shower    Complete by:  As directed    You may shower without a dressing once there is no drainage.  Do not wash over the wound.  If drainage remains, do not shower until drainage stops.   TED hose    Complete by:  As directed    Use stockings (TED hose) for 3 weeks on both leg(s).  You may remove them at night for sleeping.   Weight bearing as tolerated    Complete by:  As directed      Allergies as of 08/31/2017      Reactions   Sulfa Antibiotics Rash   Broke out in a rash 40 years ago       Medication List    STOP taking these medications   alendronate 35 MG tablet Commonly known as:  FOSAMAX   aspirin EC 81 MG tablet   CALCIUM 600+D 600-800 MG-UNIT  Tabs Generic drug:  Calcium Carb-Cholecalciferol   ciprofloxacin 250 MG tablet Commonly known as:  CIPRO   HYDROcodone-acetaminophen 5-325 MG tablet Commonly known as:  NORCO/VICODIN   meloxicam 7.5 MG tablet Commonly known as:  MOBIC   PRESERVISION AREDS 2 PO     TAKE these medications   iron polysaccharides 150 MG capsule Commonly known as:  NIFEREX Take 1 capsule (150 mg total) by mouth daily.   lisinopril 20 MG tablet Commonly known as:  PRINIVIL,ZESTRIL Take 20 mg by mouth daily after lunch.   methocarbamol 500 MG tablet Commonly known as:  ROBAXIN Take 1 tablet (500 mg total) by mouth every 6 (six) hours as needed for muscle spasms.   oxyCODONE 5 MG immediate release tablet Commonly known as:  Oxy IR/ROXICODONE Take 1-2 tablets (5-10 mg total) by mouth every 4 (four) hours as  needed for moderate pain or severe pain.   pravastatin 40 MG tablet Commonly known as:  PRAVACHOL Take 40 mg by mouth daily after lunch.   rivaroxaban 10 MG Tabs tablet Commonly known as:  XARELTO Take 1 tablet (10 mg total) by mouth daily with breakfast. Take Xarelto for two and a half more weeks following discharge from the hospital, then discontinue Xarelto. Once the patient has completed the Xarelto, they may resume the 81 mg Aspirin.   traMADol 50 MG tablet Commonly known as:  ULTRAM Take 1-2 tablets (50-100 mg total) by mouth every 6 (six) hours as needed for moderate pain.            Discharge Care Instructions        Start     Ordered   09/01/17 0000  rivaroxaban (XARELTO) 10 MG TABS tablet  Daily with breakfast    Question:  Supervising Provider  Answer:  Gaynelle Arabian   08/31/17 0906   08/31/17 0000  iron polysaccharides (NIFEREX) 150 MG capsule  Daily    Question:  Supervising Provider  Answer:  Gaynelle Arabian   08/31/17 0906   08/31/17 0000  methocarbamol (ROBAXIN) 500 MG tablet  Every 6 hours PRN    Question:  Supervising Provider  Answer:  Gaynelle Arabian    08/31/17 0906   08/31/17 0000  oxyCODONE (OXY IR/ROXICODONE) 5 MG immediate release tablet  Every 4 hours PRN    Question:  Supervising Provider  Answer:  Gaynelle Arabian   08/31/17 0906   08/31/17 0000  traMADol (ULTRAM) 50 MG tablet  Every 6 hours PRN    Question:  Supervising Provider  Answer:  Gaynelle Arabian   08/31/17 0906   08/31/17 0000  Call MD / Call 911    Comments:  If you experience chest pain or shortness of breath, CALL 911 and be transported to the hospital emergency room.  If you develope a fever above 101 F, pus (white drainage) or increased drainage or redness at the wound, or calf pain, call your surgeon's office.   08/31/17 0906   08/31/17 0000  Discharge instructions    Comments:  Take Xarelto for two and a half more weeks, then discontinue Xarelto. Once the patient has completed the Xarelto, they may resume the 81 mg Aspirin.  Pick up stool softner and laxative for home use following surgery while on pain medications. Do not submerge incision under water. Please use good hand washing techniques while changing dressing each day. May shower starting three days after surgery. Please use a clean towel to pat the incision dry following showers. Continue to use ice for pain and swelling after surgery. Do not use any lotions or creams on the incision until instructed by your surgeon.  Wear both TED hose on both legs during the day every day for three weeks, but may remove the TED hose at night at home.  Postoperative Constipation Protocol  Constipation - defined medically as fewer than three stools per week and severe constipation as less than one stool per week.  One of the most common issues patients have following surgery is constipation.  Even if you have a regular bowel pattern at home, your normal regimen is likely to be disrupted due to multiple reasons following surgery.  Combination of anesthesia, postoperative narcotics, change in appetite and fluid intake all can  affect your bowels.  In order to avoid complications following surgery, here are some recommendations in order to help you during your recovery  period.  Colace (docusate) - Pick up an over-the-counter form of Colace or another stool softener and take twice a day as long as you are requiring postoperative pain medications.  Take with a full glass of water daily.  If you experience loose stools or diarrhea, hold the colace until you stool forms back up.  If your symptoms do not get better within 1 week or if they get worse, check with your doctor.  Dulcolax (bisacodyl) - Pick up over-the-counter and take as directed by the product packaging as needed to assist with the movement of your bowels.  Take with a full glass of water.  Use this product as needed if not relieved by Colace only.   MiraLax (polyethylene glycol) - Pick up over-the-counter to have on hand.  MiraLax is a solution that will increase the amount of water in your bowels to assist with bowel movements.  Take as directed and can mix with a glass of water, juice, soda, coffee, or tea.  Take if you go more than two days without a movement. Do not use MiraLax more than once per day. Call your doctor if you are still constipated or irregular after using this medication for 7 days in a row.  If you continue to have problems with postoperative constipation, please contact the office for further assistance and recommendations.  If you experience "the worst abdominal pain ever" or develop nausea or vomiting, please contact the office immediatly for further recommendations for treatment.   08/31/17 0906   08/31/17 0000  Diet - low sodium heart healthy     08/31/17 0906   08/31/17 0000  Constipation Prevention    Comments:  Drink plenty of fluids.  Prune juice may be helpful.  You may use a stool softener, such as Colace (over the counter) 100 mg twice a day.  Use MiraLax (over the counter) for constipation as needed.   08/31/17 0906   08/31/17  0000  Increase activity slowly as tolerated     08/31/17 0906   08/31/17 0000  Patient may shower    Comments:  You may shower without a dressing once there is no drainage.  Do not wash over the wound.  If drainage remains, do not shower until drainage stops.   08/31/17 0906   08/31/17 0000  Weight bearing as tolerated     08/31/17 0906   08/31/17 0000  Driving restrictions    Comments:  No driving until released by the physician.   08/31/17 0906   08/31/17 0000  Lifting restrictions    Comments:  No lifting until released by the physician.   08/31/17 0906   08/31/17 0000  Change dressing    Comments:  You may change your dressing dressing daily with sterile 4 x 4 inch gauze dressing and paper tape.  Do not submerge the incision under water.   08/31/17 0906   08/31/17 0000  TED hose    Comments:  Use stockings (TED hose) for 3 weeks on both leg(s).  You may remove them at night for sleeping.   08/31/17 0906   08/31/17 0000  Do not sit on low chairs, stoools or toilet seats, as it may be difficult to get up from low surfaces     08/31/17 0906     Follow-up Information    Gaynelle Arabian, MD. Schedule an appointment as soon as possible for a visit on 09/12/2017.   Specialty:  Orthopedic Surgery Contact information: [address]  [place] [address] [phone]           Signed: Arlee Muslim, PA-C Orthopaedic Surgery 08/31/2017, 9:09 AM

## 2017-08-31 NOTE — Discharge Instructions (Signed)
° °Dr. Frank Aluisio °Total Joint Specialist °[facility] °[address] °[place], [place] [address] °[phone] ° °ANTERIOR APPROACH TOTAL HIP REPLACEMENT POSTOPERATIVE DIRECTIONS ° ° °Hip Rehabilitation, Guidelines Following Surgery  °The results of a hip operation are greatly improved after range of motion and muscle strengthening exercises. Follow all safety measures which are given to protect your hip. If any of these exercises cause increased pain or swelling in your joint, decrease the amount until you are comfortable again. Then slowly increase the exercises. Call your caregiver if you have problems or questions.  ° °HOME CARE INSTRUCTIONS  °Remove items at home which could result in a fall. This includes throw rugs or furniture in walking pathways.  °· ICE to the affected hip every three hours for 30 minutes at a time and then as needed for pain and swelling.  Continue to use ice on the hip for pain and swelling from surgery. You may notice swelling that will progress down to the foot and ankle.  This is normal after surgery.  Elevate the leg when you are not up walking on it.   °· Continue to use the breathing machine which will help keep your temperature down.  It is common for your temperature to cycle up and down following surgery, especially at night when you are not up moving around and exerting yourself.  The breathing machine keeps your lungs expanded and your temperature down. ° ° °DIET °You may resume your previous home diet once your are discharged from the hospital. ° °DRESSING / WOUND CARE / SHOWERING °You may shower 3 days after surgery, but keep the wounds dry during showering.  You may use an occlusive plastic wrap (Press'n Seal for example), NO SOAKING/SUBMERGING IN THE BATHTUB.  If the bandage gets wet, change with a clean dry gauze.  If the incision gets wet, pat the wound dry with a clean towel. °You may start showering once you are discharged home but do not  submerge the incision under water. Just pat the incision dry and apply a dry gauze dressing on daily. °Change the surgical dressing daily and reapply a dry dressing each time. ° °ACTIVITY °Walk with your walker as instructed. °Use walker as long as suggested by your caregivers. °Avoid periods of inactivity such as sitting longer than an hour when not asleep. This helps prevent blood clots.  °You may resume a sexual relationship in one month or when given the OK by your doctor.  °You may return to work once you are cleared by your doctor.  °Do not drive a car for 6 weeks or until released by you surgeon.  °Do not drive while taking narcotics. ° °WEIGHT BEARING °Weight bearing as tolerated with assist device (walker, cane, etc) as directed, use it as long as suggested by your surgeon or therapist, typically at least 4-6 weeks. ° °POSTOPERATIVE CONSTIPATION PROTOCOL °Constipation - defined medically as fewer than three stools per week and severe constipation as less than one stool per week. ° °One of the most common issues patients have following surgery is constipation.  Even if you have a regular bowel pattern at home, your normal regimen is likely to be disrupted due to multiple reasons following surgery.  Combination of anesthesia, postoperative narcotics, change in appetite and fluid intake all can affect your bowels.  In order to avoid complications following surgery, here are some recommendations in order to help you during your recovery period. ° °Colace (docusate) - Pick up an over-the-counter   form of Colace or another stool softener and take twice a day as long as you are requiring postoperative pain medications.  Take with a full glass of water daily.  If you experience loose stools or diarrhea, hold the colace until you stool forms back up.  If your symptoms do not get better within 1 week or if they get worse, check with your doctor. ° °Dulcolax (bisacodyl) - Pick up over-the-counter and take as directed  by the product packaging as needed to assist with the movement of your bowels.  Take with a full glass of water.  Use this product as needed if not relieved by Colace only.  ° °MiraLax (polyethylene glycol) - Pick up over-the-counter to have on hand.  MiraLax is a solution that will increase the amount of water in your bowels to assist with bowel movements.  Take as directed and can mix with a glass of water, juice, soda, coffee, or tea.  Take if you go more than two days without a movement. °Do not use MiraLax more than once per day. Call your doctor if you are still constipated or irregular after using this medication for 7 days in a row. ° °If you continue to have problems with postoperative constipation, please contact the office for further assistance and recommendations.  If you experience "the worst abdominal pain ever" or develop nausea or vomiting, please contact the office immediatly for further recommendations for treatment. ° °ITCHING ° If you experience itching with your medications, try taking only a single pain pill, or even half a pain pill at a time.  You can also use Benadryl over the counter for itching or also to help with sleep.  ° °TED HOSE STOCKINGS °Wear the elastic stockings on both legs for three weeks following surgery during the day but you may remove then at night for sleeping. ° °MEDICATIONS °See your medication summary on the “After Visit Summary” that the nursing staff will review with you prior to discharge.  You may have some home medications which will be placed on hold until you complete the course of blood thinner medication.  It is important for you to complete the blood thinner medication as prescribed by your surgeon.  Continue your approved medications as instructed at time of discharge. ° °PRECAUTIONS °If you experience chest pain or shortness of breath - call 911 immediately for transfer to the hospital emergency department.  °If you develop a fever greater that 101 F,  purulent drainage from wound, increased redness or drainage from wound, foul odor from the wound/dressing, or calf pain - CONTACT YOUR SURGEON.   °                                                °FOLLOW-UP APPOINTMENTS °Make sure you keep all of your appointments after your operation with your surgeon and caregivers. You should call the office at the above phone number and make an appointment for approximately two weeks after the date of your surgery or on the date instructed by your surgeon outlined in the "After Visit Summary". ° °RANGE OF MOTION AND STRENGTHENING EXERCISES  °These exercises are designed to help you keep full movement of your hip joint. Follow your caregiver's or physical therapist's instructions. Perform all exercises about fifteen times, three times per day or as directed. Exercise both hips, even if you   have had only one joint replacement. These exercises can be done on a training (exercise) mat, on the floor, on a table or on a bed. Use whatever works the best and is most comfortable for you. Use music or television while you are exercising so that the exercises are a pleasant break in your day. This will make your life better with the exercises acting as a break in routine you can look forward to.  °Lying on your back, slowly slide your foot toward your buttocks, raising your knee up off the floor. Then slowly slide your foot back down until your leg is straight again.  °Lying on your back spread your legs as far apart as you can without causing discomfort.  °Lying on your side, raise your upper leg and foot straight up from the floor as far as is comfortable. Slowly lower the leg and repeat.  °Lying on your back, tighten up the muscle in the front of your thigh (quadriceps muscles). You can do this by keeping your leg straight and trying to raise your heel off the floor. This helps strengthen the largest muscle supporting your knee.  °Lying on your back, tighten up the muscles of your  buttocks both with the legs straight and with the knee bent at a comfortable angle while keeping your heel on the floor.  ° °IF YOU ARE TRANSFERRED TO A SKILLED REHAB FACILITY °If the patient is transferred to a skilled rehab facility following release from the hospital, a list of the current medications will be sent to the facility for the patient to continue.  When discharged from the skilled rehab facility, please have the facility set up the patient's Home Health Physical Therapy prior to being released. Also, the skilled facility will be responsible for providing the patient with their medications at time of release from the facility to include their pain medication, the muscle relaxants, and their blood thinner medication. If the patient is still at the rehab facility at time of the two week follow up appointment, the skilled rehab facility will also need to assist the patient in arranging follow up appointment in our office and any transportation needs. ° °MAKE SURE YOU:  °Understand these instructions.  °Get help right away if you are not doing well or get worse.  ° ° °Pick up stool softner and laxative for home use following surgery while on pain medications. °Do not submerge incision under water. °Please use good hand washing techniques while changing dressing each day. °May shower starting three days after surgery. °Please use a clean towel to pat the incision dry following showers. °Continue to use ice for pain and swelling after surgery. °Do not use any lotions or creams on the incision until instructed by your surgeon. ° °Take Xarelto for two and a half more weeks following discharge from the hospital, then discontinue Xarelto. °Once the patient has completed the Xarelto, they may resume the 81 mg Aspirin. ° °

## 2017-08-31 NOTE — Progress Notes (Signed)
   Subjective: 1 Day Post-Op Procedure(s) (LRB): RIGHT TOTAL HIP ARTHROPLASTY ANTERIOR APPROACH (Right) Patient reports pain as mild.   Patient seen in rounds with Dr. Wynelle Link. Patient is well, and has had no acute complaints or problems We will start therapy today.  Plan is to go Home after hospital stay.  Objective: Vital signs in last 24 hours: Temp:  [97.5 F (36.4 C)-98.7 F (37.1 C)] 97.9 F (36.6 C) (09/20 0457) Pulse Rate:  [58-74] 60 (09/20 0457) Resp:  [13-17] 16 (09/20 0457) BP: (100-174)/(42-70) 104/55 (09/20 0457) SpO2:  [99 %-100 %] 100 % (09/20 0457) Weight:  [50.8 kg (112 lb)] 50.8 kg (112 lb) (09/19 1800)  Intake/Output from previous day:  Intake/Output Summary (Last 24 hours) at 08/31/17 0844 Last data filed at 08/31/17 0600  Gross per 24 hour  Intake             2925 ml  Output             2475 ml  Net              450 ml    Intake/Output this shift: No intake/output data recorded.  Labs:  Recent Labs  08/30/17 1825 08/31/17 0533  HGB 10.6* 9.4*    Recent Labs  08/30/17 1825 08/31/17 0533  WBC 15.0* 11.5*  RBC 3.71* 3.27*  HCT 31.4* 27.7*  PLT 208 196    Recent Labs  08/30/17 1825 08/31/17 0533  NA 137 136  K 4.1 4.2  CL 105 106  CO2 24 24  BUN 16 16  CREATININE 0.62 0.64  GLUCOSE 125* 126*  CALCIUM 8.9 8.6*   No results for input(s): LABPT, INR in the last 72 hours.  EXAM General - Patient is Alert, Appropriate and Oriented Extremity - Sensation intact distally Intact pulses distally Dorsiflexion/Plantar flexion intact Dressing - dressing C/D/I Motor Function - intact, moving foot and toes well on exam.  Hemovac pulled without difficulty.  Past Medical History:  Diagnosis Date  . Arthritis   . HLD (hyperlipidemia)   . Hypertension   . Macular degeneration    bilateral   . Osteoporosis   . Varicose veins of both lower extremities     Assessment/Plan: 1 Day Post-Op Procedure(s) (LRB): RIGHT TOTAL HIP  ARTHROPLASTY ANTERIOR APPROACH (Right) Principal Problem:   OA (osteoarthritis) of hip  Estimated body mass index is 21.87 kg/m as calculated from the following:   Height as of this encounter: 5' (1.524 m).   Weight as of this encounter: 50.8 kg (112 lb). Discharge home with home health  DVT Prophylaxis - Xarelto Weight Bearing As Tolerated right Leg Hemovac Pulled Begin Therapy Monitor blood pressure, obtain orthostatic blood pressure with next physical therapy session.  Add iron supplement. Possible chance of discharge depending on patient progress.  Arlee Muslim, PA-C Orthopaedic Surgery 08/31/2017, 8:44 AM

## 2017-08-31 NOTE — Evaluation (Signed)
Physical Therapy Evaluation Patient Details Name: Pamela Price MRN: [id number] DOB: January 01, 1933 Today's Date: 08/31/2017   History of Present Illness  Pt s/p R THR and with hx of osteoporosis and macular degeneration  Clinical Impression  Pt s/p R THR and presents with decreased R LE strength/ROM and post op pain limiting functional mobility.  Pt should progress to dc home with family assist and HHPT follow up.    Follow Up Recommendations Home health PT;DC plan and follow up therapy as arranged by surgeon    Equipment Recommendations  Rolling walker with 5" wheels;3in1 (PT)    Recommendations for Other Services       Precautions / Restrictions Precautions Precautions: Fall Restrictions Weight Bearing Restrictions: No Other Position/Activity Restrictions: WBAT      Mobility  Bed Mobility Overal bed mobility: Needs Assistance Bed Mobility: Supine to Sit     Supine to sit: Min assist     General bed mobility comments: cues for sequence and use of L LE to self assist  Transfers Overall transfer level: Needs assistance Equipment used: Rolling walker (2 wheeled) Transfers: Sit to/from Stand Sit to Stand: Min assist         General transfer comment: cues for LE management and use of UEs to self assist  Ambulation/Gait Ambulation/Gait assistance: Min assist Ambulation Distance (Feet): 85 Feet Assistive device: Rolling walker (2 wheeled) Gait Pattern/deviations: Step-to pattern;Step-through pattern;Decreased step length - right;Decreased step length - left;Shuffle;Trunk flexed Gait velocity: decr Gait velocity interpretation: Below normal speed for age/gender General Gait Details: cues for sequence, posture and position from [employer]            Wheelchair Mobility    Modified Rankin (Stroke Patients Only)       Balance Overall balance assessment: Needs assistance Sitting-balance support: No upper extremity supported;Feet supported Sitting  balance-Leahy Scale: Good     Standing balance support: No upper extremity supported Standing balance-Leahy Scale: Fair                               Pertinent Vitals/Pain Pain Assessment: 0-10 Pain Score: 4  Pain Location: R hip Pain Descriptors / Indicators: Aching;Sore Pain Intervention(s): Limited activity within patient's tolerance;Monitored during session;Premedicated before session;Ice applied    Home Living Family/patient expects to be discharged to:: Private residence Living Arrangements: Spouse/significant other Available Help at Discharge: Family Type of Home: House Home Access: Stairs to enter Entrance Stairs-Rails: None Technical brewer of Steps: 2 Home Layout: One level Home Equipment: None      Prior Function Level of Independence: Independent               Hand Dominance        Extremity/Trunk Assessment   Upper Extremity Assessment Upper Extremity Assessment: Overall WFL for tasks assessed    Lower Extremity Assessment Lower Extremity Assessment: RLE deficits/detail RLE Deficits / Details: Strength at hip 2+/5 with AAROM at hip to 90 flex and 20 abd       Communication   Communication: No difficulties  Cognition Arousal/Alertness: Awake/alert Behavior During Therapy: WFL for tasks assessed/performed;Impulsive Overall Cognitive Status: Within Functional Limits for tasks assessed                                        General Comments      Exercises Total Joint  Exercises Ankle Circles/Pumps: AROM;Both;15 reps;Supine Quad Sets: AROM;Both;10 reps;Supine Heel Slides: AAROM;Right;20 reps;Supine Hip ABduction/ADduction: AAROM;Right;15 reps;Supine   Assessment/Plan    PT Assessment Patient needs continued PT services  PT Problem List Decreased strength;Decreased range of motion;Decreased activity tolerance;Decreased balance;Decreased mobility;Decreased knowledge of use of DME;Pain       PT  Treatment Interventions DME instruction;Gait training;Stair training;Functional mobility training;Therapeutic activities;Therapeutic exercise;Balance training;Patient/family education    PT Goals (Current goals can be found in the Care Plan section)  Acute Rehab PT Goals Patient Stated Goal: Be driving my car in a few weeks PT Goal Formulation: With patient Time For Goal Achievement: 09/03/17 Potential to Achieve Goals: Good    Frequency 7X/week   Barriers to discharge        Co-evaluation               AM-PAC PT "6 Clicks" Daily Activity  Outcome Measure Difficulty turning over in bed (including adjusting bedclothes, sheets and blankets)?: Unable Difficulty moving from lying on back to sitting on the side of the bed? : Unable Difficulty sitting down on and standing up from a chair with arms (e.g., wheelchair, bedside commode, etc,.)?: Unable Help needed moving to and from a bed to chair (including a wheelchair)?: A Little Help needed walking in hospital room?: A Little Help needed climbing 3-5 steps with a railing? : A Little 6 Click Score: 12    End of Session Equipment Utilized During Treatment: Gait belt Activity Tolerance: Patient tolerated treatment well Patient left: in chair;with call bell/phone within reach;with family/visitor present Nurse Communication: Mobility status PT Visit Diagnosis: Unsteadiness on feet (R26.81);Difficulty in walking, not elsewhere classified (R26.2)    Time: 3845-3646 PT Time Calculation (min) (ACUTE ONLY): 38 min   Charges:   PT Evaluation $PT Eval Low Complexity: 1 Low PT Treatments $Gait Training: 8-22 mins $Therapeutic Exercise: 8-22 mins   PT G Codes:        Pg [phone]   Vena Bassinger 08/31/2017, 1:12 PM

## 2017-08-31 NOTE — Progress Notes (Addendum)
Discharge planning, spoke with patient and spouse at beside. No preference for [facility] agency, contacted multiple agencies that were unable to accept. Finally contacted [facility] for referral, they will accept. Needs RW and 3-n-1, contacted [facility] to deliver to room. [phone]

## 2017-08-31 NOTE — Progress Notes (Signed)
Physical Therapy Treatment Patient Details Name: Pamela Price MRN: [id number] DOB: [birth year] Today's Date: 08/31/2017    History of Present Illness Pt s/p R THR and with hx of osteoporosis and macular degeneration    PT Comments    Good progress with mobility and improvement noted in stability and activity tolerance.   Follow Up Recommendations  Home health PT;DC plan and follow up therapy as arranged by surgeon     Equipment Recommendations  Rolling walker with 5" wheels;3in1 (PT)    Recommendations for Other Services       Precautions / Restrictions Precautions Precautions: Fall Restrictions Weight Bearing Restrictions: No Other Position/Activity Restrictions: WBAT    Mobility  Bed Mobility               General bed mobility comments: Pt OOB and requests back to chair  Transfers Overall transfer level: Needs assistance Equipment used: Rolling walker (2 wheeled) Transfers: Sit to/from Stand Sit to Stand: Min assist         General transfer comment: cues for LE management and use of UEs to self assist  Ambulation/Gait Ambulation/Gait assistance: Min assist Ambulation Distance (Feet): 123 Feet (and 15' to bathroom) Assistive device: Rolling walker (2 wheeled) Gait Pattern/deviations: Step-to pattern;Step-through pattern;Decreased step length - right;Decreased step length - left;Shuffle;Trunk flexed Gait velocity: decr Gait velocity interpretation: Below normal speed for age/gender General Gait Details: cues for sequence, posture and position from [employer]            Wheelchair Mobility    Modified Rankin (Stroke Patients Only)       Balance Overall balance assessment: Needs assistance Sitting-balance support: No upper extremity supported;Feet supported Sitting balance-Leahy Scale: Good     Standing balance support: No upper extremity supported Standing balance-Leahy Scale: Fair                               Cognition Arousal/Alertness: Awake/alert Behavior During Therapy: WFL for tasks assessed/performed;Impulsive Overall Cognitive Status: Within Functional Limits for tasks assessed                                        Exercises      General Comments        Pertinent Vitals/Pain Pain Assessment: 0-10 Pain Score: 4  Pain Location: R hip Pain Descriptors / Indicators: Aching;Sore Pain Intervention(s): Limited activity within patient's tolerance;Monitored during session;Premedicated before session;Ice applied    Home Living                      Prior Function            PT Goals (current goals can now be found in the care plan section) Acute Rehab PT Goals Patient Stated Goal: Be driving my car in a few weeks PT Goal Formulation: With patient Time For Goal Achievement: 09/03/17 Potential to Achieve Goals: Good Progress towards PT goals: Progressing toward goals    Frequency    7X/week      PT Plan Current plan remains appropriate    Co-evaluation              AM-PAC PT "6 Clicks" Daily Activity  Outcome Measure  Difficulty turning over in bed (including adjusting bedclothes, sheets and blankets)?: Unable Difficulty moving from lying on back to sitting on the side  of the bed? : Unable Difficulty sitting down on and standing up from a chair with arms (e.g., wheelchair, bedside commode, etc,.)?: Unable Help needed moving to and from a bed to chair (including a wheelchair)?: A Little Help needed walking in hospital room?: A Little Help needed climbing 3-5 steps with a railing? : A Little 6 Click Score: 12    End of Session Equipment Utilized During Treatment: Gait belt Activity Tolerance: Patient tolerated treatment well Patient left: in chair;with call bell/phone within reach;with family/visitor present Nurse Communication: Mobility status PT Visit Diagnosis: Unsteadiness on feet (R26.81);Difficulty in walking, not elsewhere  classified (R26.2)     Time: 8022-3361 PT Time Calculation (min) (ACUTE ONLY): 29 min  Charges:  $Gait Training: 8-22 mins $Therapeutic Activity: 8-22 mins                    G Codes:       Pg [phone]    Cachet Mccutchen 08/31/2017, 5:05 PM

## 2017-09-01 LAB — BASIC METABOLIC PANEL WITH GFR
Anion gap: 6 (ref 5–15)
BUN: 15 mg/dL (ref 6–20)
CO2: 25 mmol/L (ref 22–32)
Calcium: 8.8 mg/dL — ABNORMAL LOW (ref 8.9–10.3)
Chloride: 109 mmol/L (ref 101–111)
Creatinine, Ser: 0.6 mg/dL (ref 0.44–1.00)
GFR calc Af Amer: >60 mL/min
GFR calc non Af Amer: >60 mL/min
Glucose, Bld: 110 mg/dL — ABNORMAL HIGH (ref 65–99)
Potassium: 4.2 mmol/L (ref 3.5–5.1)
Sodium: 140 mmol/L (ref 135–145)

## 2017-09-01 LAB — CBC
HCT: 28.8 % — ABNORMAL LOW (ref 36.0–46.0)
Hemoglobin: 9.6 g/dL — ABNORMAL LOW (ref 12.0–15.0)
MCH: 27.8 pg (ref 26.0–34.0)
MCHC: 33.3 g/dL (ref 30.0–36.0)
MCV: 83.5 fL (ref 78.0–100.0)
Platelets: 213 10*3/uL (ref 150–400)
RBC: 3.45 MIL/uL — ABNORMAL LOW (ref 3.87–5.11)
RDW: 14.2 % (ref 11.5–15.5)
WBC: 12 10*3/uL — ABNORMAL HIGH (ref 4.0–10.5)

## 2017-09-01 NOTE — Progress Notes (Addendum)
Spoke with [employer] rep, patient would have large copay with them so they referred her to [facility], they have accepted the patient and will provide HHPT with start of service on Monday 9/24. Discussed with nurse to give exercises/homework for the weekend. [phone]

## 2017-09-01 NOTE — Progress Notes (Signed)
   Subjective: 2 Days Post-Op Procedure(s) (LRB): RIGHT TOTAL HIP ARTHROPLASTY ANTERIOR APPROACH (Right) Patient reports pain as mild.   Patient seen in rounds with Dr. Wynelle Link. Family in room with patient. Patient is well, but has had some minor complaints of pain in the hip, requiring pain medications Patient is ready to go home today.  Objective: Vital signs in last 24 hours: Temp:  [97.9 F (36.6 C)-98.3 F (36.8 C)] 98.2 F (36.8 C) (09/21 0453) Pulse Rate:  [62-66] 66 (09/21 0453) Resp:  [15-16] 15 (09/21 0453) BP: (129-158)/(45-58) 129/56 (09/21 0453) SpO2:  [99 %-100 %] 99 % (09/21 0453)  Intake/Output from previous day:  Intake/Output Summary (Last 24 hours) at 09/01/17 0853 Last data filed at 09/01/17 0758  Gross per 24 hour  Intake             1165 ml  Output             2250 ml  Net            -1085 ml    Intake/Output this shift: Total I/O In: -  Out: 300 [Urine:300]  Labs:  Recent Labs  08/30/17 1825 08/31/17 0533 09/01/17 0520  HGB 10.6* 9.4* 9.6*    Recent Labs  08/31/17 0533 09/01/17 0520  WBC 11.5* 12.0*  RBC 3.27* 3.45*  HCT 27.7* 28.8*  PLT 196 213    Recent Labs  08/31/17 0533 09/01/17 0520  NA 136 140  K 4.2 4.2  CL 106 109  CO2 24 25  BUN 16 15  CREATININE 0.64 0.60  GLUCOSE 126* 110*  CALCIUM 8.6* 8.8*   No results for input(s): LABPT, INR in the last 72 hours.  EXAM: General - Patient is Alert, Appropriate and Oriented Extremity - Neurovascular intact Sensation intact distally Intact pulses distally Dorsiflexion/Plantar flexion intact Incision - clean, dry, no drainage Motor Function - intact, moving foot and toes well on exam.   Assessment/Plan: 2 Days Post-Op Procedure(s) (LRB): RIGHT TOTAL HIP ARTHROPLASTY ANTERIOR APPROACH (Right) Procedure(s) (LRB): RIGHT TOTAL HIP ARTHROPLASTY ANTERIOR APPROACH (Right) Past Medical History:  Diagnosis Date  . Arthritis   . HLD (hyperlipidemia)   . Hypertension   .  Macular degeneration    bilateral   . Osteoporosis   . Varicose veins of both lower extremities    Principal Problem:   OA (osteoarthritis) of hip  Estimated body mass index is 21.87 kg/m as calculated from the following:   Height as of this encounter: 5' (1.524 m).   Weight as of this encounter: 50.8 kg (112 lb). Advance diet Up with therapy Discharge home with home health verus outpatient therapy Diet - Cardiac diet Follow up - in 2 weeks Activity - WBAT Disposition - Home Condition Upon Discharge - Good D/C Meds - See DC Summary DVT Prophylaxis - Xarelto   Patient states issue with getting HHPT arranged.  If not covered, please notify and we will get patient setup to start outpatient therapy as soon as possible.  Arlee Muslim, PA-C Orthopaedic Surgery 09/01/2017, 8:53 AM

## 2017-09-01 NOTE — Progress Notes (Signed)
Physical Therapy Treatment Patient Details Name: Pamela Price MRN: [id number] DOB: October 29, 1933 Today's Date: 09/01/2017    History of Present Illness Pt s/p R THR and with hx of osteoporosis and macular degeneration    PT Comments    Pt progressing steadily with mobility.  Pt's son present and reviewed home therex, car transfers and stairs with written instruction provided.   Follow Up Recommendations  Home health PT;DC plan and follow up therapy as arranged by surgeon     Equipment Recommendations  Rolling walker with 5" wheels;3in1 (PT)    Recommendations for Other Services       Precautions / Restrictions Precautions Precautions: Fall Restrictions Weight Bearing Restrictions: No Other Position/Activity Restrictions: WBAT    Mobility  Bed Mobility               General bed mobility comments: Pt OOB and declines back to bed  Transfers Overall transfer level: Needs assistance Equipment used: Rolling walker (2 wheeled) Transfers: Sit to/from Stand Sit to Stand: Min guard;Supervision         General transfer comment: cues for LE management and use of UEs to self assist  Ambulation/Gait Ambulation/Gait assistance: Min guard;Supervision Ambulation Distance (Feet): 140 Feet Assistive device: Rolling walker (2 wheeled) Gait Pattern/deviations: Step-to pattern;Step-through pattern;Decreased step length - right;Decreased step length - left;Shuffle;Trunk flexed Gait velocity: decr Gait velocity interpretation: Below normal speed for age/gender General Gait Details: cues for sequence, posture and position from RW   Stairs Stairs: Yes   Stair Management: No rails;Step to pattern;Backwards;With walker Number of Stairs: 4 General stair comments: 2 stairs twice with cues for sequence and foot/RW placement.  Son assisting on second attempt with written instruction provided  Wheelchair Mobility    Modified Rankin (Stroke Patients Only)       Balance  Overall balance assessment: Needs assistance Sitting-balance support: No upper extremity supported;Feet supported Sitting balance-Leahy Scale: Good     Standing balance support: No upper extremity supported Standing balance-Leahy Scale: Fair                              Cognition Arousal/Alertness: Awake/alert Behavior During Therapy: WFL for tasks assessed/performed;Impulsive Overall Cognitive Status: Within Functional Limits for tasks assessed                                        Exercises Total Joint Exercises Ankle Circles/Pumps: AROM;Both;15 reps;Supine Quad Sets: AROM;Both;10 reps;Supine Heel Slides: AAROM;Right;20 reps;Supine Hip ABduction/ADduction: AAROM;Right;15 reps;Supine Long Arc Quad: AROM;AAROM;Right;10 reps;Seated    General Comments        Pertinent Vitals/Pain Pain Assessment: 0-10 Pain Score: 3  Pain Location: R hip Pain Descriptors / Indicators: Aching;Sore Pain Intervention(s): Limited activity within patient's tolerance;Monitored during session;Premedicated before session;Ice applied    Home Living                      Prior Function            PT Goals (current goals can now be found in the care plan section) Acute Rehab PT Goals Patient Stated Goal: Be driving my car in a few weeks PT Goal Formulation: With patient Time For Goal Achievement: 09/03/17 Potential to Achieve Goals: Good Progress towards PT goals: Progressing toward goals    Frequency    7X/week      PT  Plan Current plan remains appropriate    Co-evaluation              AM-PAC PT "6 Clicks" Daily Activity  Outcome Measure  Difficulty turning over in bed (including adjusting bedclothes, sheets and blankets)?: A Lot Difficulty moving from lying on back to sitting on the side of the bed? : A Lot Difficulty sitting down on and standing up from a chair with arms (e.g., wheelchair, bedside commode, etc,.)?: A Little Help  needed moving to and from a bed to chair (including a wheelchair)?: A Little Help needed walking in hospital room?: A Little Help needed climbing 3-5 steps with a railing? : A Little 6 Click Score: 16    End of Session Equipment Utilized During Treatment: Gait belt Activity Tolerance: Patient tolerated treatment well Patient left: in chair;with call bell/phone within reach;with family/visitor present Nurse Communication: Mobility status PT Visit Diagnosis: Unsteadiness on feet (R26.81);Difficulty in walking, not elsewhere classified (R26.2)     Time: 1125-1208 PT Time Calculation (min) (ACUTE ONLY): 43 min  Charges:  $Gait Training: 8-22 mins $Therapeutic Exercise: 8-22 mins $Therapeutic Activity: 8-22 mins                    G Codes:       Pg [phone]    Neville Pauls 09/01/2017, 1:06 PM

## 2017-09-04 DIAGNOSIS — H353130 Nonexudative age-related macular degeneration, bilateral, stage unspecified: Secondary | ICD-10-CM | POA: Diagnosis not present

## 2017-09-04 DIAGNOSIS — Z96641 Presence of right artificial hip joint: Secondary | ICD-10-CM | POA: Diagnosis not present

## 2017-09-04 DIAGNOSIS — Z79891 Long term (current) use of opiate analgesic: Secondary | ICD-10-CM | POA: Diagnosis not present

## 2017-09-04 DIAGNOSIS — Z87891 Personal history of nicotine dependence: Secondary | ICD-10-CM | POA: Diagnosis not present

## 2017-09-04 DIAGNOSIS — I8393 Asymptomatic varicose veins of bilateral lower extremities: Secondary | ICD-10-CM | POA: Diagnosis not present

## 2017-09-04 DIAGNOSIS — Z471 Aftercare following joint replacement surgery: Secondary | ICD-10-CM | POA: Diagnosis not present

## 2017-09-04 DIAGNOSIS — Z7901 Long term (current) use of anticoagulants: Secondary | ICD-10-CM | POA: Diagnosis not present

## 2017-09-04 DIAGNOSIS — I10 Essential (primary) hypertension: Secondary | ICD-10-CM | POA: Diagnosis not present

## 2017-09-04 DIAGNOSIS — M810 Age-related osteoporosis without current pathological fracture: Secondary | ICD-10-CM | POA: Diagnosis not present

## 2017-09-04 DIAGNOSIS — E785 Hyperlipidemia, unspecified: Secondary | ICD-10-CM | POA: Diagnosis not present

## 2017-09-06 DIAGNOSIS — Z79891 Long term (current) use of opiate analgesic: Secondary | ICD-10-CM | POA: Diagnosis not present

## 2017-09-06 DIAGNOSIS — I10 Essential (primary) hypertension: Secondary | ICD-10-CM | POA: Diagnosis not present

## 2017-09-06 DIAGNOSIS — I8393 Asymptomatic varicose veins of bilateral lower extremities: Secondary | ICD-10-CM | POA: Diagnosis not present

## 2017-09-06 DIAGNOSIS — Z96641 Presence of right artificial hip joint: Secondary | ICD-10-CM | POA: Diagnosis not present

## 2017-09-06 DIAGNOSIS — Z471 Aftercare following joint replacement surgery: Secondary | ICD-10-CM | POA: Diagnosis not present

## 2017-09-06 DIAGNOSIS — Z87891 Personal history of nicotine dependence: Secondary | ICD-10-CM | POA: Diagnosis not present

## 2017-09-06 DIAGNOSIS — E785 Hyperlipidemia, unspecified: Secondary | ICD-10-CM | POA: Diagnosis not present

## 2017-09-06 DIAGNOSIS — H353130 Nonexudative age-related macular degeneration, bilateral, stage unspecified: Secondary | ICD-10-CM | POA: Diagnosis not present

## 2017-09-06 DIAGNOSIS — Z7901 Long term (current) use of anticoagulants: Secondary | ICD-10-CM | POA: Diagnosis not present

## 2017-09-06 DIAGNOSIS — M810 Age-related osteoporosis without current pathological fracture: Secondary | ICD-10-CM | POA: Diagnosis not present

## 2017-09-08 DIAGNOSIS — I10 Essential (primary) hypertension: Secondary | ICD-10-CM | POA: Diagnosis not present

## 2017-09-08 DIAGNOSIS — Z87891 Personal history of nicotine dependence: Secondary | ICD-10-CM | POA: Diagnosis not present

## 2017-09-08 DIAGNOSIS — M810 Age-related osteoporosis without current pathological fracture: Secondary | ICD-10-CM | POA: Diagnosis not present

## 2017-09-08 DIAGNOSIS — Z79891 Long term (current) use of opiate analgesic: Secondary | ICD-10-CM | POA: Diagnosis not present

## 2017-09-08 DIAGNOSIS — H353130 Nonexudative age-related macular degeneration, bilateral, stage unspecified: Secondary | ICD-10-CM | POA: Diagnosis not present

## 2017-09-08 DIAGNOSIS — E785 Hyperlipidemia, unspecified: Secondary | ICD-10-CM | POA: Diagnosis not present

## 2017-09-08 DIAGNOSIS — Z471 Aftercare following joint replacement surgery: Secondary | ICD-10-CM | POA: Diagnosis not present

## 2017-09-08 DIAGNOSIS — Z96641 Presence of right artificial hip joint: Secondary | ICD-10-CM | POA: Diagnosis not present

## 2017-09-08 DIAGNOSIS — I8393 Asymptomatic varicose veins of bilateral lower extremities: Secondary | ICD-10-CM | POA: Diagnosis not present

## 2017-09-08 DIAGNOSIS — Z7901 Long term (current) use of anticoagulants: Secondary | ICD-10-CM | POA: Diagnosis not present

## 2017-09-12 DIAGNOSIS — Z471 Aftercare following joint replacement surgery: Secondary | ICD-10-CM | POA: Diagnosis not present

## 2017-09-12 DIAGNOSIS — E785 Hyperlipidemia, unspecified: Secondary | ICD-10-CM | POA: Diagnosis not present

## 2017-09-12 DIAGNOSIS — Z79891 Long term (current) use of opiate analgesic: Secondary | ICD-10-CM | POA: Diagnosis not present

## 2017-09-12 DIAGNOSIS — Z96641 Presence of right artificial hip joint: Secondary | ICD-10-CM | POA: Diagnosis not present

## 2017-09-12 DIAGNOSIS — I10 Essential (primary) hypertension: Secondary | ICD-10-CM | POA: Diagnosis not present

## 2017-09-12 DIAGNOSIS — I8393 Asymptomatic varicose veins of bilateral lower extremities: Secondary | ICD-10-CM | POA: Diagnosis not present

## 2017-09-12 DIAGNOSIS — H353130 Nonexudative age-related macular degeneration, bilateral, stage unspecified: Secondary | ICD-10-CM | POA: Diagnosis not present

## 2017-09-12 DIAGNOSIS — M810 Age-related osteoporosis without current pathological fracture: Secondary | ICD-10-CM | POA: Diagnosis not present

## 2017-09-12 DIAGNOSIS — Z87891 Personal history of nicotine dependence: Secondary | ICD-10-CM | POA: Diagnosis not present

## 2017-09-12 DIAGNOSIS — Z7901 Long term (current) use of anticoagulants: Secondary | ICD-10-CM | POA: Diagnosis not present

## 2017-10-03 DIAGNOSIS — M1611 Unilateral primary osteoarthritis, right hip: Secondary | ICD-10-CM | POA: Diagnosis not present

## 2017-10-18 DIAGNOSIS — M810 Age-related osteoporosis without current pathological fracture: Secondary | ICD-10-CM | POA: Diagnosis not present

## 2017-10-18 DIAGNOSIS — Z0001 Encounter for general adult medical examination with abnormal findings: Secondary | ICD-10-CM | POA: Diagnosis not present

## 2017-10-18 DIAGNOSIS — I10 Essential (primary) hypertension: Secondary | ICD-10-CM | POA: Diagnosis not present

## 2017-10-18 DIAGNOSIS — E782 Mixed hyperlipidemia: Secondary | ICD-10-CM | POA: Diagnosis not present

## 2017-10-18 DIAGNOSIS — Z6821 Body mass index (BMI) 21.0-21.9, adult: Secondary | ICD-10-CM | POA: Diagnosis not present

## 2017-10-26 DIAGNOSIS — H353232 Exudative age-related macular degeneration, bilateral, with inactive choroidal neovascularization: Secondary | ICD-10-CM | POA: Diagnosis not present

## 2017-11-16 DIAGNOSIS — H353221 Exudative age-related macular degeneration, left eye, with active choroidal neovascularization: Secondary | ICD-10-CM | POA: Diagnosis not present

## 2017-12-14 DIAGNOSIS — H353221 Exudative age-related macular degeneration, left eye, with active choroidal neovascularization: Secondary | ICD-10-CM | POA: Diagnosis not present

## 2018-01-25 DIAGNOSIS — H353221 Exudative age-related macular degeneration, left eye, with active choroidal neovascularization: Secondary | ICD-10-CM | POA: Diagnosis not present

## 2018-03-15 DIAGNOSIS — H353232 Exudative age-related macular degeneration, bilateral, with inactive choroidal neovascularization: Secondary | ICD-10-CM | POA: Diagnosis not present

## 2018-04-26 DIAGNOSIS — H353232 Exudative age-related macular degeneration, bilateral, with inactive choroidal neovascularization: Secondary | ICD-10-CM | POA: Diagnosis not present

## 2018-06-07 DIAGNOSIS — H353231 Exudative age-related macular degeneration, bilateral, with active choroidal neovascularization: Secondary | ICD-10-CM | POA: Diagnosis not present

## 2018-06-15 DIAGNOSIS — H353231 Exudative age-related macular degeneration, bilateral, with active choroidal neovascularization: Secondary | ICD-10-CM | POA: Diagnosis not present

## 2018-07-26 DIAGNOSIS — H353231 Exudative age-related macular degeneration, bilateral, with active choroidal neovascularization: Secondary | ICD-10-CM | POA: Diagnosis not present

## 2018-07-30 ENCOUNTER — Encounter: Payer: Self-pay | Admitting: Podiatry

## 2018-07-30 ENCOUNTER — Other Ambulatory Visit: Payer: Self-pay

## 2018-07-30 ENCOUNTER — Ambulatory Visit: Payer: Medicare HMO | Admitting: Podiatry

## 2018-07-30 VITALS — BP 107/61 | HR 72 | Resp 15 | Ht 62.0 in | Wt 110.0 lb

## 2018-07-30 DIAGNOSIS — M79676 Pain in unspecified toe(s): Secondary | ICD-10-CM | POA: Diagnosis not present

## 2018-07-30 DIAGNOSIS — L600 Ingrowing nail: Secondary | ICD-10-CM | POA: Diagnosis not present

## 2018-07-30 DIAGNOSIS — B351 Tinea unguium: Secondary | ICD-10-CM | POA: Diagnosis not present

## 2018-07-30 DIAGNOSIS — M79609 Pain in unspecified limb: Secondary | ICD-10-CM

## 2018-07-30 MED ORDER — CICLOPIROX 8 % EX SOLN: Freq: Every day | CUTANEOUS | 0 refills | Status: DC

## 2018-07-30 NOTE — Progress Notes (Signed)
Subjective:  Patient ID: Pamela Price, female    DOB: 03-31-1933,  MRN: [id number]  Chief Complaint  Patient presents with  . Nail Problem    L hallux medial border x 10 days; very sore Tx: warm water and triple antibiotic -pt denies drainage/ swelling- w/ light redness  . Nail Problem    L 2nd-5th thickening and discolored x 1 year; sore Tx: none    84 y.o. female presents with the above complaint. Reports pain in the toes of the L foot. Believes she has an ingrown nail to the great toe states that the other nails are thickened and cause her pain.  The nail is been ingrown for at least 10 days.  The discoloration and thickness has been present for over a year.  Denies prior treatment for the nail fungus infection but is used warm water soaks for the ingrown nail.  Review of Systems: Negative except as noted in the HPI. Denies N/V/F/Ch.  Past Medical History:  Diagnosis Date  . Arthritis   . HLD (hyperlipidemia)   . Hypertension   . Macular degeneration    bilateral   . Osteoporosis   . Varicose veins of both lower extremities     Current Outpatient Medications:  .  alendronate (FOSAMAX) 35 MG tablet, , Disp: , Rfl:  .  aspirin EC 81 MG tablet, Take 81 mg by mouth daily., Disp: , Rfl:  .  lisinopril (PRINIVIL,ZESTRIL) 20 MG tablet, Take 20 mg by mouth daily after lunch., Disp: , Rfl:  .  meloxicam (MOBIC) 7.5 MG tablet, Take 7.5 mg by mouth daily., Disp: , Rfl:  .  pravastatin (PRAVACHOL) 40 MG tablet, Take 40 mg by mouth daily after lunch. , Disp: , Rfl:  .  ciclopirox (PENLAC) 8 % solution, Apply topically at bedtime. Apply over nail and surrounding skin. Apply daily over previous coat. Remove weekly with file or polish remover., Disp: 6.6 mL, Rfl: 0  Social History   Tobacco Use  Smoking Status Former Smoker  . Years: 5.00  . Types: Cigarettes  Smokeless Tobacco Never Used  Tobacco Comment   quit 40 years ago     Allergies  Allergen Reactions  . Sulfa  Antibiotics Rash    Broke out in a rash 40 years ago    Objective:   Vitals:   07/30/18 1526  BP: 107/61  Pulse: 72  Resp: 15   Body mass index is 20.12 kg/m. Constitutional Well developed. Well nourished.  Vascular Dorsalis pedis pulses palpable bilaterally. Posterior tibial pulses palpable bilaterally. Capillary refill normal to all digits.  No cyanosis or clubbing noted. Pedal hair growth normal.  Neurologic Normal speech. Oriented to person, place, and time. Epicritic sensation to light touch grossly present bilaterally.  Dermatologic L hallux nail ingrowing at medial nail border. L 2nd-4th toenail dystrophy, thickening, pain No open wounds. No skin lesions.  Orthopedic: Normal joint ROM without pain or crepitus bilaterally. No visible deformities. No bony tenderness.   Radiographs: None Assessment:   1. Ingrown nail   2. Pain around toenail   3. Pain due to onychomycosis of nail    Plan:  Patient was evaluated and treated and all questions answered.  Ingrown Nail, left -Patient elects to proceed with minor surgery to remove ingrown toenail today -Ingrown nail excised. See procedure note. -Educated on post-procedure care including soaking. Written instructions provided. -Patient to follow up in 2 weeks for nail check.  Procedure: Excision of Ingrown Toenail Location: Left 1st toe  medial nail borders. Anesthesia: Lidocaine 1% plain; 1.60mL and Marcaine 0.5% plain; 1.27mL, digital block. Skin Prep: Betadine. Dressing: abx ointment; telfa; dry, sterile, compression dressing. Technique: Following skin prep, the toe was exsanguinated and a tourniquet was secured at the base of the toe. The affected nail border was freed, split with a nail splitter, and excised. Chemical matrixectomy was then performed with phenol and irrigated out with alcohol. The tourniquet was then removed and sterile dressing applied. Disposition: Patient tolerated procedure well. Patient to  return in 2 weeks for follow-up.    Onychomycosis -Educated on etiology of nail fungus. -Discussed use of Penlac for eradication of nail fungus. Rx sent to pharmacy.  Return in about 2 weeks (around 08/13/2018) for Nail Check.

## 2018-07-30 NOTE — Progress Notes (Signed)
   Subjective:    Patient ID: Pamela Price, female    DOB: [birth year], 84 y.o.   MRN: [id number]  HPI    Review of Systems  All other systems reviewed and are negative.      Objective:   Physical Exam        Assessment & Plan:

## 2018-07-30 NOTE — Patient Instructions (Signed)

## 2018-08-20 ENCOUNTER — Ambulatory Visit (INDEPENDENT_AMBULATORY_CARE_PROVIDER_SITE_OTHER): Payer: Medicare HMO | Admitting: Podiatry

## 2018-08-20 DIAGNOSIS — M79676 Pain in unspecified toe(s): Secondary | ICD-10-CM | POA: Diagnosis not present

## 2018-08-20 DIAGNOSIS — L600 Ingrowing nail: Secondary | ICD-10-CM | POA: Diagnosis not present

## 2018-08-20 NOTE — Progress Notes (Signed)
  Subjective:  Patient ID: Pamela Price, female    DOB: 08-27-1933,  MRN: [id number]  Chief Complaint  Patient presents with  . nail check    F/U L hallux Pt. stated," it's been doing fine, but with the shoes I'm wearing it's sort of tender." Tx: tiple antibiotic, and epsom salt soaking -pt denies drainage, swelling, or redness   84 y.o. female returns for the above complaint.  States that is doing fine but she is getting tenderness with the shoes that she is wearing but she thinks it could be more from the other side of the nail on the side that she had the surgery on it.  Objective:   General AA&O x3. Normal mood and affect.  Vascular Foot warm and well perfused with good capillary refill.  Neurologic Sensation grossly intact.  Dermatologic Nail avulsion site healing well without drainage or erythema. Nail bed with overlying soft crust No signs of local infection.  Orthopedic: No tenderness to palpation of the toe.   Assessment & Plan:  Patient was evaluated and treated and all questions answered.  S/p Ingrown Toenail Excision, left -Healing well without issue. -Lateral nail border cut and slant back fashion -Discussed return precautions. -F/u PRN

## 2018-08-30 DIAGNOSIS — H353231 Exudative age-related macular degeneration, bilateral, with active choroidal neovascularization: Secondary | ICD-10-CM | POA: Diagnosis not present

## 2018-08-30 DIAGNOSIS — H353211 Exudative age-related macular degeneration, right eye, with active choroidal neovascularization: Secondary | ICD-10-CM | POA: Diagnosis not present

## 2018-08-30 DIAGNOSIS — H353221 Exudative age-related macular degeneration, left eye, with active choroidal neovascularization: Secondary | ICD-10-CM | POA: Diagnosis not present

## 2018-09-07 DIAGNOSIS — H2511 Age-related nuclear cataract, right eye: Secondary | ICD-10-CM | POA: Diagnosis not present

## 2018-09-07 DIAGNOSIS — Z01818 Encounter for other preprocedural examination: Secondary | ICD-10-CM | POA: Diagnosis not present

## 2018-09-07 DIAGNOSIS — H25811 Combined forms of age-related cataract, right eye: Secondary | ICD-10-CM | POA: Diagnosis not present

## 2018-09-11 DIAGNOSIS — R195 Other fecal abnormalities: Secondary | ICD-10-CM | POA: Diagnosis not present

## 2018-09-11 DIAGNOSIS — Z1211 Encounter for screening for malignant neoplasm of colon: Secondary | ICD-10-CM | POA: Diagnosis not present

## 2018-09-12 DIAGNOSIS — E782 Mixed hyperlipidemia: Secondary | ICD-10-CM | POA: Diagnosis not present

## 2018-09-12 DIAGNOSIS — R195 Other fecal abnormalities: Secondary | ICD-10-CM | POA: Diagnosis not present

## 2018-09-12 DIAGNOSIS — I10 Essential (primary) hypertension: Secondary | ICD-10-CM | POA: Diagnosis not present

## 2018-09-13 DIAGNOSIS — Z1211 Encounter for screening for malignant neoplasm of colon: Secondary | ICD-10-CM | POA: Diagnosis not present

## 2018-09-13 DIAGNOSIS — R195 Other fecal abnormalities: Secondary | ICD-10-CM | POA: Diagnosis not present

## 2018-09-26 DIAGNOSIS — I10 Essential (primary) hypertension: Secondary | ICD-10-CM | POA: Diagnosis not present

## 2018-09-26 DIAGNOSIS — M818 Other osteoporosis without current pathological fracture: Secondary | ICD-10-CM | POA: Diagnosis not present

## 2018-09-26 DIAGNOSIS — L210 Seborrhea capitis: Secondary | ICD-10-CM | POA: Diagnosis not present

## 2018-09-26 DIAGNOSIS — R195 Other fecal abnormalities: Secondary | ICD-10-CM | POA: Diagnosis not present

## 2018-09-26 DIAGNOSIS — Z23 Encounter for immunization: Secondary | ICD-10-CM | POA: Diagnosis not present

## 2018-09-26 DIAGNOSIS — Z1231 Encounter for screening mammogram for malignant neoplasm of breast: Secondary | ICD-10-CM | POA: Diagnosis not present

## 2018-09-26 DIAGNOSIS — E782 Mixed hyperlipidemia: Secondary | ICD-10-CM | POA: Diagnosis not present

## 2018-09-26 DIAGNOSIS — R002 Palpitations: Secondary | ICD-10-CM | POA: Diagnosis not present

## 2018-10-04 DIAGNOSIS — H25812 Combined forms of age-related cataract, left eye: Secondary | ICD-10-CM | POA: Diagnosis not present

## 2018-10-04 DIAGNOSIS — H2512 Age-related nuclear cataract, left eye: Secondary | ICD-10-CM | POA: Diagnosis not present

## 2018-10-11 DIAGNOSIS — H353231 Exudative age-related macular degeneration, bilateral, with active choroidal neovascularization: Secondary | ICD-10-CM | POA: Diagnosis not present

## 2018-10-26 DIAGNOSIS — Z1231 Encounter for screening mammogram for malignant neoplasm of breast: Secondary | ICD-10-CM | POA: Diagnosis not present

## 2018-10-26 DIAGNOSIS — M810 Age-related osteoporosis without current pathological fracture: Secondary | ICD-10-CM | POA: Diagnosis not present

## 2018-10-26 DIAGNOSIS — N959 Unspecified menopausal and perimenopausal disorder: Secondary | ICD-10-CM | POA: Diagnosis not present

## 2018-11-22 DIAGNOSIS — Z01818 Encounter for other preprocedural examination: Secondary | ICD-10-CM | POA: Diagnosis not present

## 2018-11-22 DIAGNOSIS — H353231 Exudative age-related macular degeneration, bilateral, with active choroidal neovascularization: Secondary | ICD-10-CM | POA: Diagnosis not present

## 2018-11-22 DIAGNOSIS — H25811 Combined forms of age-related cataract, right eye: Secondary | ICD-10-CM | POA: Diagnosis not present

## 2018-12-10 DIAGNOSIS — I10 Essential (primary) hypertension: Secondary | ICD-10-CM | POA: Diagnosis not present

## 2018-12-12 HISTORY — PX: OTHER SURGICAL HISTORY: SHX169

## 2018-12-13 DIAGNOSIS — R195 Other fecal abnormalities: Secondary | ICD-10-CM | POA: Diagnosis not present

## 2019-01-03 DIAGNOSIS — H353231 Exudative age-related macular degeneration, bilateral, with active choroidal neovascularization: Secondary | ICD-10-CM | POA: Diagnosis not present

## 2019-01-09 DIAGNOSIS — M810 Age-related osteoporosis without current pathological fracture: Secondary | ICD-10-CM | POA: Diagnosis not present

## 2019-01-09 DIAGNOSIS — H918X3 Other specified hearing loss, bilateral: Secondary | ICD-10-CM | POA: Diagnosis not present

## 2019-01-09 DIAGNOSIS — E782 Mixed hyperlipidemia: Secondary | ICD-10-CM | POA: Diagnosis not present

## 2019-01-09 DIAGNOSIS — I10 Essential (primary) hypertension: Secondary | ICD-10-CM | POA: Diagnosis not present

## 2019-01-09 DIAGNOSIS — Z6821 Body mass index (BMI) 21.0-21.9, adult: Secondary | ICD-10-CM | POA: Diagnosis not present

## 2019-01-09 DIAGNOSIS — Z0001 Encounter for general adult medical examination with abnormal findings: Secondary | ICD-10-CM | POA: Diagnosis not present

## 2019-01-18 DIAGNOSIS — K635 Polyp of colon: Secondary | ICD-10-CM | POA: Diagnosis not present

## 2019-01-18 DIAGNOSIS — D120 Benign neoplasm of cecum: Secondary | ICD-10-CM | POA: Diagnosis not present

## 2019-01-18 DIAGNOSIS — K5730 Diverticulosis of large intestine without perforation or abscess without bleeding: Secondary | ICD-10-CM | POA: Diagnosis not present

## 2019-01-18 DIAGNOSIS — R195 Other fecal abnormalities: Secondary | ICD-10-CM | POA: Diagnosis not present

## 2019-01-25 DIAGNOSIS — K635 Polyp of colon: Secondary | ICD-10-CM | POA: Diagnosis not present

## 2019-02-10 HISTORY — PX: COLON SURGERY: SHX602

## 2019-02-11 DIAGNOSIS — C180 Malignant neoplasm of cecum: Secondary | ICD-10-CM | POA: Diagnosis not present

## 2019-02-11 DIAGNOSIS — D122 Benign neoplasm of ascending colon: Secondary | ICD-10-CM | POA: Diagnosis not present

## 2019-02-11 DIAGNOSIS — Z7982 Long term (current) use of aspirin: Secondary | ICD-10-CM | POA: Diagnosis not present

## 2019-02-11 DIAGNOSIS — H3530 Unspecified macular degeneration: Secondary | ICD-10-CM | POA: Diagnosis not present

## 2019-02-11 DIAGNOSIS — K635 Polyp of colon: Secondary | ICD-10-CM | POA: Diagnosis not present

## 2019-02-11 DIAGNOSIS — E785 Hyperlipidemia, unspecified: Secondary | ICD-10-CM | POA: Diagnosis not present

## 2019-02-11 DIAGNOSIS — I10 Essential (primary) hypertension: Secondary | ICD-10-CM | POA: Diagnosis not present

## 2019-02-11 DIAGNOSIS — Z79899 Other long term (current) drug therapy: Secondary | ICD-10-CM | POA: Diagnosis not present

## 2019-02-11 DIAGNOSIS — M810 Age-related osteoporosis without current pathological fracture: Secondary | ICD-10-CM | POA: Diagnosis not present

## 2019-02-22 DIAGNOSIS — Z09 Encounter for follow-up examination after completed treatment for conditions other than malignant neoplasm: Secondary | ICD-10-CM | POA: Diagnosis not present

## 2019-02-22 DIAGNOSIS — R1031 Right lower quadrant pain: Secondary | ICD-10-CM | POA: Diagnosis not present

## 2019-02-22 DIAGNOSIS — R1030 Lower abdominal pain, unspecified: Secondary | ICD-10-CM | POA: Diagnosis not present

## 2019-02-22 DIAGNOSIS — Z6821 Body mass index (BMI) 21.0-21.9, adult: Secondary | ICD-10-CM | POA: Diagnosis not present

## 2019-02-22 DIAGNOSIS — E278 Other specified disorders of adrenal gland: Secondary | ICD-10-CM | POA: Diagnosis not present

## 2019-02-28 DIAGNOSIS — H353221 Exudative age-related macular degeneration, left eye, with active choroidal neovascularization: Secondary | ICD-10-CM | POA: Diagnosis not present

## 2019-02-28 DIAGNOSIS — H353211 Exudative age-related macular degeneration, right eye, with active choroidal neovascularization: Secondary | ICD-10-CM | POA: Diagnosis not present

## 2019-03-01 DIAGNOSIS — R1031 Right lower quadrant pain: Secondary | ICD-10-CM | POA: Diagnosis not present

## 2019-04-02 DIAGNOSIS — N83292 Other ovarian cyst, left side: Secondary | ICD-10-CM | POA: Diagnosis not present

## 2019-04-02 DIAGNOSIS — R9389 Abnormal findings on diagnostic imaging of other specified body structures: Secondary | ICD-10-CM | POA: Diagnosis not present

## 2019-04-02 DIAGNOSIS — R1900 Intra-abdominal and pelvic swelling, mass and lump, unspecified site: Secondary | ICD-10-CM | POA: Diagnosis not present

## 2019-04-04 DIAGNOSIS — N83202 Unspecified ovarian cyst, left side: Secondary | ICD-10-CM | POA: Diagnosis not present

## 2019-04-17 DIAGNOSIS — E782 Mixed hyperlipidemia: Secondary | ICD-10-CM | POA: Diagnosis not present

## 2019-04-17 DIAGNOSIS — I10 Essential (primary) hypertension: Secondary | ICD-10-CM | POA: Diagnosis not present

## 2019-04-17 DIAGNOSIS — N83202 Unspecified ovarian cyst, left side: Secondary | ICD-10-CM | POA: Diagnosis not present

## 2019-04-17 DIAGNOSIS — Z6821 Body mass index (BMI) 21.0-21.9, adult: Secondary | ICD-10-CM | POA: Diagnosis not present

## 2019-04-17 DIAGNOSIS — C182 Malignant neoplasm of ascending colon: Secondary | ICD-10-CM | POA: Diagnosis not present

## 2019-04-17 DIAGNOSIS — Z1159 Encounter for screening for other viral diseases: Secondary | ICD-10-CM | POA: Diagnosis not present

## 2019-05-04 DIAGNOSIS — H353231 Exudative age-related macular degeneration, bilateral, with active choroidal neovascularization: Secondary | ICD-10-CM | POA: Diagnosis not present

## 2019-05-30 DIAGNOSIS — N83202 Unspecified ovarian cyst, left side: Secondary | ICD-10-CM | POA: Diagnosis not present

## 2019-06-21 DIAGNOSIS — N83292 Other ovarian cyst, left side: Secondary | ICD-10-CM | POA: Diagnosis not present

## 2019-06-24 DIAGNOSIS — D4959 Neoplasm of unspecified behavior of other genitourinary organ: Secondary | ICD-10-CM | POA: Diagnosis not present

## 2019-07-10 ENCOUNTER — Telehealth: Payer: Self-pay | Admitting: *Deleted

## 2019-07-10 NOTE — Telephone Encounter (Signed)
Called and spoke with the patient. She has an appt for 8/10 at 9am, webex. Explained that she will receive e mail with the meeting date/time and instructions

## 2019-07-19 ENCOUNTER — Telehealth: Payer: Self-pay | Admitting: Gynecologic Oncology

## 2019-07-22 ENCOUNTER — Inpatient Hospital Stay: Payer: Medicare HMO | Attending: Gynecologic Oncology | Admitting: Gynecologic Oncology

## 2019-07-22 ENCOUNTER — Encounter: Payer: Self-pay | Admitting: Gynecologic Oncology

## 2019-07-22 DIAGNOSIS — N83202 Unspecified ovarian cyst, left side: Secondary | ICD-10-CM

## 2019-07-22 HISTORY — DX: Unspecified ovarian cyst, left side: N83.202

## 2019-07-22 NOTE — Progress Notes (Signed)
Gynecologic Oncology Telehealth Consult Note: Gyn-Onc  I connected with Pamela Price on 07/22/19 at  9:00 AM EDT by telephone and verified that I am speaking with the correct person using two identifiers.  I discussed the limitations, risks, security and privacy concerns of performing an evaluation and management service by telemedicine and the availability of in-person appointments. I also discussed with the patient that there may be a patient responsible charge related to this service. The patient expressed understanding and agreed to proceed.  Other persons participating in the visit and their role in the encounter: none.  Patient's location: home Provider's location: [place]  Chief Complaint:  Chief Complaint  Patient presents with  . left ovarian cyst    Assessment/Plan:  Pamela Price  is a 85 y.o.  year old with a complex left adnexal cyst, and normal CA 125, incidentally identified on post-surgical CT scan in March, 2020. The cyst did not change significantly on repeat imaging in April, 2020.  I reviewed the images from the CT scan and feel that this has mostly benign features. However, given that it is technically complex in appearance, I am recommending either surgical excision versus close follow-up with repeat imaging in 6 months to document stability.  Given that Ms Gehrig lives with her husband who is of poor health, and has a son who requires a lot of assistance for schizophrenia, she would prefer to avoid a major surgery right now.  We will schedule her for a follow-up visit with me in February, 2021 after a repeat US in [place] in February, 2020.   HPI: Pamela Price is a 85 year old P3 who is seen in consultation at the request of Dr. Modesta Messing for a complex left ovarian cyst.  The patient's history began when she was noted to have a colonic mass identified on colonoscopy in the winter 2020.  This subsequently was followed with a  laparoscopic hemicolectomy performed by Dr. Lilia Pro in [place] in February 12, 2019.  The patient reports the procedure was uncomplicated.  Subsequently malignancy, per patient stage I, was identified in the pathology specimen.  No further adjuvant therapy was recommended.  As part of work-up of her cancer diagnosis and subsequent staging a CT scan of the abdomen and pelvis was performed on February 22, 2019.  This revealed that she was status post right hemicolectomy.  The uterus appeared unremarkable.  A multiloculated cystic structure measuring 8 x 5 cm was noted in the left adnexal region.  There is no right adnexal abnormality noted.  No ascites no carcinomatosis, no lymphadenopathy was appreciated.  A transvaginal ultrasound scan was performed on March 29, 2019.  This confirmed a normal-appearing uterus measuring 6.5 x 4 x 2.6 cm with a 4 mm endometrial thickness.  The right ovary measured 1.4 cm and was grossly normal.  The left ovary measured 6.9 x 7.4 x 4.2 cm with a multicystic abnormality noted within.  The largest single cystic area measured 5.7 cm in diameter.  At least 1 septa appeared to be somewhat thickened.  There is no abnormal free fluid.  Ca1 25 was drawn on April 04, 2019 and was normal at 10.  Follow-up ultrasound scan was performed on May 30, 2019.  This showed a stable appearing left ovarian complex cyst measuring 7.4 x 4.4 x 4.9 cm that was multicystic and avascular.  No cul-de-sac free fluid was identified.  The patient remains asymptomatic from this cyst.  She reports a medical history significant  for macular degeneration for which she receives regular bevacizumab injections, she also has a history of hyperlipidemia hypertension and osteoporosis.  Additionally she has a history of colon cancer at age 85.  Her past surgical history is significant for 2 prior cesarean sections, tubal ligation, hip replacement, and a hemicolectomy (right) in March 2020.  She is a non-smoker.  She lives  with her husband who has severe COPD, is legally blind, has poor wound healing, and was admitted to hospital in August 2020 with a lower extremity fracture after a fall.  She is his primary caretaker.  The patient also has a schizophrenic son who is age 73.  He lives in [place], and requires significant assistance from her.  The patient has had one prior vaginal delivery and 2 prior cesarean sections.  Current Meds:  Outpatient Encounter Medications as of 07/22/2019  Medication Sig  . alendronate (FOSAMAX) 35 MG tablet   . aspirin EC 81 MG tablet Take 81 mg by mouth daily.  . ciclopirox (PENLAC) 8 % solution Apply topically at bedtime. Apply over nail and surrounding skin. Apply daily over previous coat. Remove weekly with file or polish remover.  Marland Kitchen lisinopril (PRINIVIL,ZESTRIL) 20 MG tablet Take 20 mg by mouth daily after lunch.  . meloxicam (MOBIC) 7.5 MG tablet Take 7.5 mg by mouth daily.  . pravastatin (PRAVACHOL) 40 MG tablet Take 40 mg by mouth daily after lunch.    No facility-administered encounter medications on file as of 07/22/2019.     Allergy:  Allergies  Allergen Reactions  . Sulfa Antibiotics Rash    Broke out in a rash 40 years ago     Social Hx:   Social History   Socioeconomic History  . Marital status: Married    Spouse name: Not on file  . Number of children: Not on file  . Years of education: Not on file  . Highest education level: Not on file  Occupational History  . Not on file  Social Needs  . Financial resource strain: Not on file  . Food insecurity    Worry: Not on file    Inability: Not on file  . Transportation needs    Medical: Not on file    Non-medical: Not on file  Tobacco Use  . Smoking status: Former Smoker    Years: 5.00    Types: Cigarettes  . Smokeless tobacco: Never Used  . Tobacco comment: quit 40 years ago   Substance and Sexual Activity  . Alcohol use: Yes    Comment: occasionally   . Drug use: No  . Sexual activity: Not on  file  Lifestyle  . Physical activity    Days per week: Not on file    Minutes per session: Not on file  . Stress: Not on file  Relationships  . Social Herbalist on phone: Not on file    Gets together: Not on file    Attends religious service: Not on file    Active member of club or organization: Not on file    Attends meetings of clubs or organizations: Not on file    Relationship status: Not on file  . Intimate partner violence    Fear of current or ex partner: Not on file    Emotionally abused: Not on file    Physically abused: Not on file    Forced sexual activity: Not on file  Other Topics Concern  . Not on file  Social History Narrative  .  Not on file    Past Surgical Hx:  Past Surgical History:  Procedure Laterality Date  . BREAST LUMPECTOMY W/ NEEDLE LOCALIZATION     axillary tail of the left  breast   . COLON SURGERY  02/2019   for stage I colon cancer  . TOTAL HIP ARTHROPLASTY Right 08/30/2017   Procedure: RIGHT TOTAL HIP ARTHROPLASTY ANTERIOR APPROACH;  Surgeon: Gaynelle Arabian, MD;  Location: [facility];  Service: Orthopedics;  Laterality: Right;    Past Medical Hx:  Past Medical History:  Diagnosis Date  . Arthritis   . HLD (hyperlipidemia)   . Hypertension   . Macular degeneration    bilateral   . Osteoporosis   . Varicose veins of both lower extremities     Past Gynecological History:  See HPI No LMP recorded. Patient is postmenopausal.  Family Hx: History reviewed. No pertinent family history.  Review of Systems:  Constitutional  Feels well,   ENT Normal appearing ears and nares bilaterally Skin/Breast  No rash, sores, jaundice, itching, dryness Cardiovascular  No chest pain, shortness of breath, or edema  Pulmonary  No cough or wheeze.  Gastro Intestinal  No nausea, vomitting, or diarrhoea. No bright red blood per rectum, no abdominal pain, change in bowel movement, or constipation.  Genito Urinary  No frequency, urgency,  dysuria, no bleeding or pelvic pain Musculo Skeletal  No myalgia, arthralgia, joint swelling or pain  Neurologic  No weakness, numbness, change in gait,  Psychology  No depression, anxiety, insomnia.   Physical Exam: Deferred due to telephone encounter. I discussed the assessment and treatment plan with the patient. The patient was provided with an opportunity to ask questions and all were answered. The patient agreed with the plan and demonstrated an understanding of the instructions.   The patient was advised to call back or see an in-person evaluation if the symptoms worsen or if the condition fails to improve as anticipated.   I provided 30 minutes of non face-to-face telephone visit time (because the webex visit failed due to technical issues) during this encounter, and > 50% was spent counseling as documented under my assessment & plan.   Thereasa Solo, MD  07/22/2019, 10:34 AM

## 2019-07-24 ENCOUNTER — Telehealth: Payer: Self-pay | Admitting: Oncology

## 2019-07-24 NOTE — Telephone Encounter (Signed)
Dr. Maxie Barb office called back and said they do not have a CEA on Pamela Price.

## 2019-07-24 NOTE — Telephone Encounter (Signed)
Called Pamela Price and advised her of ultrasound appointment at [facility] on 01/14/19 with arrival at 12:30 and instructions to drink 32 ounces of fluid 1 hour before the appointment.  She verbalized understanding and agreement.

## 2019-07-24 NOTE — Telephone Encounter (Signed)
Requested op note and pathology from [facility].  [facility] in [place] and left a message for the nurse to see if CEA has been drawn.  Requested a return call.

## 2019-08-14 IMAGING — DX DG PORTABLE PELVIS
1 series · 1 of 1 positions shown · non-contrast
Comparison: None.

CLINICAL DATA: Status post hip arthroplasty

EXAM:
PORTABLE PELVIS 1-2 VIEWS

[pelvis ap]
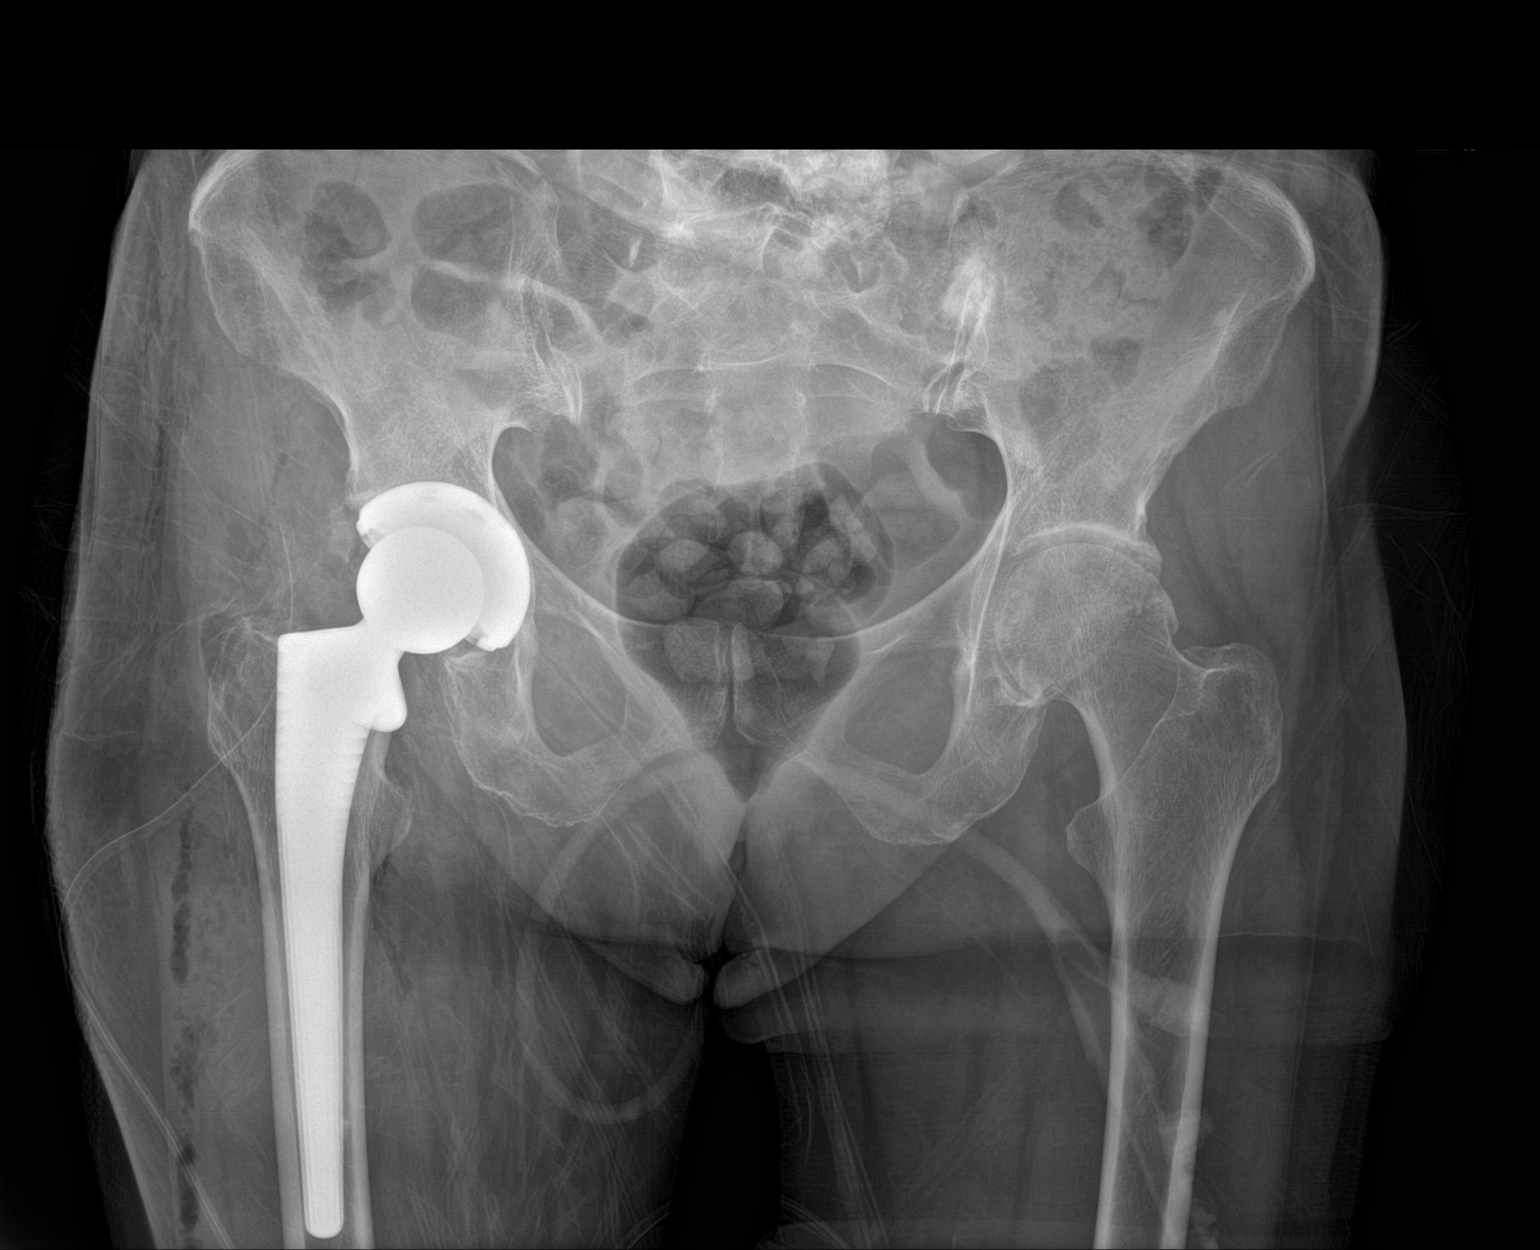

[1 of 1 positions shown; findings below may reference images not displayed]

FINDINGS: Status post right total hip arthroplasty, in satisfactory position.

Associated subcutaneous gas.

Mild degenerative changes of the left hip.

Visualized bony pelvis appears intact.
IMPRESSION: Status post right total hip arthroplasty, in satisfactory position.

## 2019-08-22 DIAGNOSIS — H353231 Exudative age-related macular degeneration, bilateral, with active choroidal neovascularization: Secondary | ICD-10-CM | POA: Diagnosis not present

## 2019-09-26 DIAGNOSIS — Z23 Encounter for immunization: Secondary | ICD-10-CM | POA: Diagnosis not present

## 2019-10-17 DIAGNOSIS — H353231 Exudative age-related macular degeneration, bilateral, with active choroidal neovascularization: Secondary | ICD-10-CM | POA: Diagnosis not present

## 2019-10-30 DIAGNOSIS — M19041 Primary osteoarthritis, right hand: Secondary | ICD-10-CM | POA: Diagnosis not present

## 2019-12-19 DIAGNOSIS — H353231 Exudative age-related macular degeneration, bilateral, with active choroidal neovascularization: Secondary | ICD-10-CM | POA: Diagnosis not present

## 2020-01-15 ENCOUNTER — Encounter: Payer: Self-pay | Admitting: Gynecologic Oncology

## 2020-01-15 DIAGNOSIS — N83202 Unspecified ovarian cyst, left side: Secondary | ICD-10-CM | POA: Diagnosis not present

## 2020-01-16 ENCOUNTER — Telehealth: Payer: Self-pay | Admitting: *Deleted

## 2020-01-16 NOTE — Telephone Encounter (Signed)
Called and scheduled the patient for a phone visit on Monday 2/8

## 2020-01-17 ENCOUNTER — Telehealth: Payer: Self-pay | Admitting: Gynecologic Oncology

## 2020-01-17 ENCOUNTER — Ambulatory Visit
Admission: RE | Admit: 2020-01-17 | Discharge: 2020-01-17 | Disposition: A | Discharge status: Home / Self Care | Payer: Self-pay | Source: Ambulatory Visit | Attending: Gynecologic Oncology | Admitting: Gynecologic Oncology

## 2020-01-17 ENCOUNTER — Other Ambulatory Visit: Payer: Self-pay

## 2020-01-17 DIAGNOSIS — N83202 Unspecified ovarian cyst, left side: Secondary | ICD-10-CM

## 2020-01-20 ENCOUNTER — Inpatient Hospital Stay: Payer: Medicare HMO | Attending: Gynecologic Oncology | Admitting: Gynecologic Oncology

## 2020-01-20 ENCOUNTER — Encounter: Payer: Self-pay | Admitting: Gynecologic Oncology

## 2020-01-20 DIAGNOSIS — N83202 Unspecified ovarian cyst, left side: Secondary | ICD-10-CM

## 2020-01-20 NOTE — Progress Notes (Signed)
Gynecologic Oncology Telehealth Follow-up Visit Note: Gyn-Onc  I connected with Pamela Price on 01/20/20 at 10:00 AM EST by telephone and verified that I am speaking with the correct person using two identifiers.  I discussed the limitations, risks, security and privacy concerns of performing an evaluation and management service by telemedicine and the availability of in-person appointments. I also discussed with the patient that there may be a patient responsible charge related to this service. The patient expressed understanding and agreed to proceed.  Other persons participating in the visit and their role in the encounter: none.  Patient's location: home Provider's location: [place]  Chief Complaint:  Chief Complaint  Patient presents with  . left ovarian cyst    Assessment/Plan:  Pamela Price  is a 86 y.o.  year old with a fairly stable, asymptomatic complex left adnexal cyst, and normal CA 125, incidentally identified on post-surgical CT scan in March, 2020. The cyst did not change significantly on repeat imaging in February, 2021.  I reviewed the images from the CT scan and feel that this has mostly benign features. However, given that it is technically complex in appearance, I am continuing to recommend either surgical excision versus close follow-up with repeat imaging in 6 months to document stability.  Given that [name] lives with her husband who is of poor health, and has a son who requires a lot of assistance for schizophrenia, she would still prefer to avoid a major surgery right now.  We will schedule her for a repeat US in [place] in August, 2021 and she will then arrange a visit with me (in person or by phone) after that is done to discuss results.   HPI: Pamela Price is a 86 year old P3 who is seen in consultation at the request of Dr. Modesta Messing for a complex left ovarian cyst.  The patient's history began when she was noted  to have a colonic mass identified on colonoscopy in the winter 2020.  This subsequently was followed with a laparoscopic hemicolectomy performed by Dr. Lilia Pro in [place] in February 12, 2019.  The patient reports the procedure was uncomplicated.  Subsequently malignancy, per patient stage I, was identified in the pathology specimen.  No further adjuvant therapy was recommended.  As part of work-up of her cancer diagnosis and subsequent staging a CT scan of the abdomen and pelvis was performed on February 22, 2019.  This revealed that she was status post right hemicolectomy.  The uterus appeared unremarkable.  A multiloculated cystic structure measuring 8 x 5 cm was noted in the left adnexal region.  There is no right adnexal abnormality noted.  No ascites no carcinomatosis, no lymphadenopathy was appreciated.  A transvaginal ultrasound scan was performed on March 29, 2019.  This confirmed a normal-appearing uterus measuring 6.5 x 4 x 2.6 cm with a 4 mm endometrial thickness.  The right ovary measured 1.4 cm and was grossly normal.  The left ovary measured 6.9 x 7.4 x 4.2 cm with a multicystic abnormality noted within.  The largest single cystic area measured 5.7 cm in diameter.  At least 1 septa appeared to be somewhat thickened.  There is no abnormal free fluid.  Ca1 25 was drawn on April 04, 2019 and was normal at 10.  Follow-up ultrasound scan was performed on May 30, 2019.  This showed a stable appearing left ovarian complex cyst measuring 7.4 x 4.4 x 4.9 cm that was multicystic and avascular.  No cul-de-sac free  fluid was identified.  The patient remains asymptomatic from this cyst.  She reports a medical history significant for macular degeneration for which she receives regular bevacizumab injections, she also has a history of hyperlipidemia hypertension and osteoporosis.  Additionally she has a history of colon cancer at age 71.  Her past surgical history is significant for 2 prior cesarean sections,  tubal ligation, hip replacement, and a hemicolectomy (right) in March 2020.  She is a non-smoker.  She lives with her husband who has severe COPD, is legally blind, has poor wound healing, and was admitted to hospital in August 2020 with a lower extremity fracture after a fall.  She is his primary caretaker.  The patient also has a schizophrenic son who is age 51.  He lives in [place], and requires significant assistance from her.  The patient has had one prior vaginal delivery and 2 prior cesarean sections.  Interval Hx:   Due to the condition of her husband who is ailing after hip fracture and her son who has schizophrenia and requires a great deal of attention from her, she elected for expectant management rather than surgical intervention.  Repeat ultrasound performed in [facility] on January 15, 2020 revealed a uterus measuring 5.6 x 3.1 x 3.8 cm.  A 4 mm endometrial stripe.  Right ovary not visualized.  Left ovary measured 8.2 x 4.1 x 6.1 cm containing a complex cystic mass containing multiple septations of varying thickness with some slight nodularity.  Question of a small mural nodule.  Malignancy could not be excluded.  No definite blood flow within the septations or areas of nodularity on color Doppler.  The patient reported that she continues to have no symptoms from her mass.  She reported that she is scheduled for a Covid vaccine in 1 week's time.  She continues to not desire surgical intervention unless absolutely necessary.  Current Meds:  Outpatient Encounter Medications as of 01/20/2020  Medication Sig  . alendronate (FOSAMAX) 35 MG tablet   . aspirin EC 81 MG tablet Take 81 mg by mouth daily.  . ciclopirox (PENLAC) 8 % solution Apply topically at bedtime. Apply over nail and surrounding skin. Apply daily over previous coat. Remove weekly with file or polish remover.  Marland Kitchen lisinopril (PRINIVIL,ZESTRIL) 20 MG tablet Take 20 mg by mouth daily after lunch.  . meloxicam (MOBIC)  7.5 MG tablet Take 7.5 mg by mouth daily.  . pravastatin (PRAVACHOL) 40 MG tablet Take 40 mg by mouth daily after lunch.    No facility-administered encounter medications on file as of 01/20/2020.    Allergy:  Allergies  Allergen Reactions  . Sulfa Antibiotics Rash    Broke out in a rash 40 years ago     Social Hx:   Social History   Socioeconomic History  . Marital status: Married    Spouse name: Not on file  . Number of children: Not on file  . Years of education: Not on file  . Highest education level: Not on file  Occupational History  . Not on file  Tobacco Use  . Smoking status: Former Smoker    Years: 5.00    Types: Cigarettes  . Smokeless tobacco: Never Used  . Tobacco comment: quit 40 years ago   Substance and Sexual Activity  . Alcohol use: Yes    Comment: occasionally   . Drug use: No  . Sexual activity: Not on file  Other Topics Concern  . Not on file  Social History Narrative  .  Not on file   Social Determinants of Health   Financial Resource Strain:   . Difficulty of Paying Living Expenses: Not on file  Food Insecurity:   . Worried About Charity fundraiser in the Last Year: Not on file  . Ran Out of Food in the Last Year: Not on file  Transportation Needs:   . Lack of Transportation (Medical): Not on file  . Lack of Transportation (Non-Medical): Not on file  Physical Activity:   . Days of Exercise per Week: Not on file  . Minutes of Exercise per Session: Not on file  Stress:   . Feeling of Stress : Not on file  Social Connections:   . Frequency of Communication with Friends and Family: Not on file  . Frequency of Social Gatherings with Friends and Family: Not on file  . Attends Religious Services: Not on file  . Active Member of Clubs or Organizations: Not on file  . Attends Archivist Meetings: Not on file  . Marital Status: Not on file  Intimate Partner Violence:   . Fear of Current or Ex-Partner: Not on file  . Emotionally  Abused: Not on file  . Physically Abused: Not on file  . Sexually Abused: Not on file    Past Surgical Hx:  Past Surgical History:  Procedure Laterality Date  . BREAST LUMPECTOMY W/ NEEDLE LOCALIZATION     axillary tail of the left  breast   . COLON SURGERY  02/2019   for stage I colon cancer  . TOTAL HIP ARTHROPLASTY Right 08/30/2017   Procedure: RIGHT TOTAL HIP ARTHROPLASTY ANTERIOR APPROACH;  Surgeon: Gaynelle Arabian, MD;  Location: [facility];  Service: Orthopedics;  Laterality: Right;    Past Medical Hx:  Past Medical History:  Diagnosis Date  . Arthritis   . HLD (hyperlipidemia)   . Hypertension   . Macular degeneration    bilateral   . Osteoporosis   . Varicose veins of both lower extremities     Past Gynecological History:  See HPI No LMP recorded. Patient is postmenopausal.  Family Hx: History reviewed. No pertinent family history.  Review of Systems:  Constitutional  Feels well,   ENT Normal appearing ears and nares bilaterally Skin/Breast  No rash, sores, jaundice, itching, dryness Cardiovascular  No chest pain, shortness of breath, or edema  Pulmonary  No cough or wheeze.  Gastro Intestinal  No nausea, vomitting, or diarrhoea. No bright red blood per rectum, no abdominal pain, change in bowel movement, or constipation.  Genito Urinary  No frequency, urgency, dysuria, no bleeding or pelvic pain Musculo Skeletal  No myalgia, arthralgia, joint swelling or pain  Neurologic  No weakness, numbness, change in gait,  Psychology  No depression, anxiety, insomnia.   Physical Exam: Deferred due to telephone encounter. I discussed the assessment and treatment plan with the patient. The patient was provided with an opportunity to ask questions and all were answered. The patient agreed with the plan and demonstrated an understanding of the instructions.   The patient was advised to call back or see an in-person evaluation if the symptoms worsen or if the condition  fails to improve as anticipated.   I provided 15 minutes of non face-to-face telephone visit time (because the webex visit failed due to technical issues) during this encounter, and > 50% was spent counseling as documented under my assessment & plan.   Thereasa Solo, MD  01/20/2020, 10:02 AM

## 2020-02-27 DIAGNOSIS — H353211 Exudative age-related macular degeneration, right eye, with active choroidal neovascularization: Secondary | ICD-10-CM | POA: Diagnosis not present

## 2020-02-27 DIAGNOSIS — H353221 Exudative age-related macular degeneration, left eye, with active choroidal neovascularization: Secondary | ICD-10-CM | POA: Diagnosis not present

## 2020-03-12 ENCOUNTER — Other Ambulatory Visit: Payer: Self-pay | Admitting: Family Medicine

## 2020-03-31 ENCOUNTER — Other Ambulatory Visit: Payer: Self-pay | Admitting: Family Medicine

## 2020-04-22 DIAGNOSIS — M25561 Pain in right knee: Secondary | ICD-10-CM | POA: Diagnosis not present

## 2020-04-22 DIAGNOSIS — M1711 Unilateral primary osteoarthritis, right knee: Secondary | ICD-10-CM | POA: Diagnosis not present

## 2020-05-21 DIAGNOSIS — H353231 Exudative age-related macular degeneration, bilateral, with active choroidal neovascularization: Secondary | ICD-10-CM | POA: Diagnosis not present

## 2020-05-28 ENCOUNTER — Other Ambulatory Visit: Payer: Self-pay | Admitting: Physician Assistant

## 2020-06-09 DIAGNOSIS — I739 Peripheral vascular disease, unspecified: Secondary | ICD-10-CM | POA: Diagnosis not present

## 2020-06-09 DIAGNOSIS — Z803 Family history of malignant neoplasm of breast: Secondary | ICD-10-CM | POA: Diagnosis not present

## 2020-06-09 DIAGNOSIS — M810 Age-related osteoporosis without current pathological fracture: Secondary | ICD-10-CM | POA: Diagnosis not present

## 2020-06-09 DIAGNOSIS — Z8249 Family history of ischemic heart disease and other diseases of the circulatory system: Secondary | ICD-10-CM | POA: Diagnosis not present

## 2020-06-09 DIAGNOSIS — E785 Hyperlipidemia, unspecified: Secondary | ICD-10-CM | POA: Diagnosis not present

## 2020-06-09 DIAGNOSIS — H3530 Unspecified macular degeneration: Secondary | ICD-10-CM | POA: Diagnosis not present

## 2020-06-09 DIAGNOSIS — I10 Essential (primary) hypertension: Secondary | ICD-10-CM | POA: Diagnosis not present

## 2020-06-09 DIAGNOSIS — Z7983 Long term (current) use of bisphosphonates: Secondary | ICD-10-CM | POA: Diagnosis not present

## 2020-06-09 DIAGNOSIS — Z008 Encounter for other general examination: Secondary | ICD-10-CM | POA: Diagnosis not present

## 2020-06-09 DIAGNOSIS — Z823 Family history of stroke: Secondary | ICD-10-CM | POA: Diagnosis not present

## 2020-06-09 DIAGNOSIS — Z8612 Personal history of poliomyelitis: Secondary | ICD-10-CM | POA: Diagnosis not present

## 2020-06-30 ENCOUNTER — Other Ambulatory Visit: Payer: Self-pay | Admitting: Family Medicine

## 2020-06-30 ENCOUNTER — Other Ambulatory Visit: Payer: Self-pay | Admitting: Physician Assistant

## 2020-06-30 NOTE — Telephone Encounter (Signed)
Please call and schedule fasting follow up appt at next available with me. Thanks. Dr. Tobie Poet

## 2020-07-16 DIAGNOSIS — R69 Illness, unspecified: Secondary | ICD-10-CM | POA: Diagnosis not present

## 2020-07-30 DIAGNOSIS — H353221 Exudative age-related macular degeneration, left eye, with active choroidal neovascularization: Secondary | ICD-10-CM | POA: Diagnosis not present

## 2020-07-30 DIAGNOSIS — H353211 Exudative age-related macular degeneration, right eye, with active choroidal neovascularization: Secondary | ICD-10-CM | POA: Diagnosis not present

## 2020-08-04 DIAGNOSIS — M25561 Pain in right knee: Secondary | ICD-10-CM | POA: Diagnosis not present

## 2020-08-04 DIAGNOSIS — M1711 Unilateral primary osteoarthritis, right knee: Secondary | ICD-10-CM | POA: Diagnosis not present

## 2020-08-16 ENCOUNTER — Other Ambulatory Visit: Payer: Self-pay | Admitting: Family Medicine

## 2020-09-20 ENCOUNTER — Other Ambulatory Visit: Payer: Self-pay | Admitting: Physician Assistant

## 2020-09-20 ENCOUNTER — Other Ambulatory Visit: Payer: Self-pay | Admitting: Family Medicine

## 2020-10-05 ENCOUNTER — Ambulatory Visit (INDEPENDENT_AMBULATORY_CARE_PROVIDER_SITE_OTHER): Payer: Medicare HMO | Admitting: Family Medicine

## 2020-10-05 ENCOUNTER — Other Ambulatory Visit: Payer: Self-pay

## 2020-10-05 ENCOUNTER — Encounter: Payer: Self-pay | Admitting: Infectious Diseases

## 2020-10-05 VITALS — BP 134/56 | HR 78 | Temp 97.5°F | Resp 14 | Ht 61.0 in | Wt 114.6 lb

## 2020-10-05 DIAGNOSIS — M25561 Pain in right knee: Secondary | ICD-10-CM

## 2020-10-05 DIAGNOSIS — I10 Essential (primary) hypertension: Secondary | ICD-10-CM | POA: Diagnosis not present

## 2020-10-05 DIAGNOSIS — Z85038 Personal history of other malignant neoplasm of large intestine: Secondary | ICD-10-CM

## 2020-10-05 DIAGNOSIS — G8929 Other chronic pain: Secondary | ICD-10-CM

## 2020-10-05 DIAGNOSIS — E782 Mixed hyperlipidemia: Secondary | ICD-10-CM | POA: Diagnosis not present

## 2020-10-05 DIAGNOSIS — Z23 Encounter for immunization: Secondary | ICD-10-CM | POA: Diagnosis not present

## 2020-10-05 DIAGNOSIS — N838 Other noninflammatory disorders of ovary, fallopian tube and broad ligament: Secondary | ICD-10-CM

## 2020-10-05 DIAGNOSIS — M816 Localized osteoporosis [Lequesne]: Secondary | ICD-10-CM | POA: Diagnosis not present

## 2020-10-05 MED ORDER — PRAVASTATIN SODIUM 40 MG PO TABS: 40.0000 mg | ORAL_TABLET | Freq: Every day | ORAL | 0 refills | Status: DC

## 2020-10-05 MED ORDER — MELOXICAM 7.5 MG PO TABS: 7.5000 mg | ORAL_TABLET | Freq: Every day | ORAL | 2 refills | Status: DC

## 2020-10-05 MED ORDER — LISINOPRIL 40 MG PO TABS: 40.0000 mg | ORAL_TABLET | Freq: Two times a day (BID) | ORAL | 0 refills | Status: DC

## 2020-10-05 NOTE — Progress Notes (Signed)
Subjective:  Patient ID: Pamela Price, female    DOB: [birth year]  Age: 86 y.o. MRN: [id number]  Chief Complaint  Patient presents with  . Hyperlipidemia  . Hypertension  . Osteoporosis    HPI  Pt presents for follow up of hypertension. The patient is tolerating the lisinopril well without side effects. Compliance with treatment has been good; including taking medication as directed , maintains a healthy diet and regular exercise regimen , and following up as directed.  Mixed hyperlipidemia  Pt presents with hyperlipidemia.  Compliance with medicine (pravastatin) has been good; The patient maintains a low cholesterol diet and maintains is very active. The patient denies experiencing any hypercholesterolemia related symptoms.   Patient has a history of colon cancer and underwent partial colectomy in 2017.  She has had no issues since.  It is unclear to me how often she has followed up however my understanding from her and from the notes is that it was full resection of her colon cancer with good margins.  She was found to have a ovarian mass which was investigated by gynecology.  This was felt to be a large but benign cystic mass.  Her CA-125 when checked last year was normal.  I do not see that her CEA has been checked since her diagnosis of colon cancer.  The patient is under a lot of stress.  Her husband is currently in hospice care following to strokes leaving him legally blind.  He also has significant COPD.  He had a fall in August 2021 which is also caused significant  Patient has a history of osteoporosis.  She has but been on alendronate for over 5 years.  She does take calcium with vitamin D.  She is very active.  No current outpatient medications on file prior to visit.   No current facility-administered medications on file prior to visit.   Past Medical History:  Diagnosis Date  . Arthritis   . HLD (hyperlipidemia)   . Hypertension   . Macular degeneration     bilateral   . Osteoporosis   . Varicose veins of both lower extremities    Past Surgical History:  Procedure Laterality Date  . BREAST LUMPECTOMY W/ NEEDLE LOCALIZATION     axillary tail of the left  breast   . COLON SURGERY  02/2019   for stage I colon cancer  . TOTAL HIP ARTHROPLASTY Right 08/30/2017   Procedure: RIGHT TOTAL HIP ARTHROPLASTY ANTERIOR APPROACH;  Surgeon: Gaynelle Arabian, MD;  Location: [facility];  Service: Orthopedics;  Laterality: Right;    No family history on file. Social History   Socioeconomic History  . Marital status: Married    Spouse name: Not on file  . Number of children: Not on file  . Years of education: Not on file  . Highest education level: Not on file  Occupational History  . Not on file  Tobacco Use  . Smoking status: Former Smoker    Years: 5.00    Types: Cigarettes  . Smokeless tobacco: Never Used  . Tobacco comment: quit 40 years ago   Substance and Sexual Activity  . Alcohol use: Yes    Comment: occasionally   . Drug use: No  . Sexual activity: Not on file  Other Topics Concern  . Not on file  Social History Narrative  . Not on file   Social Determinants of Health   Financial Resource Strain:   . Difficulty of Paying Living Expenses: Not on file  Food Insecurity:   . Worried About Charity fundraiser in the Last Year: Not on file  . Ran Out of Food in the Last Year: Not on file  Transportation Needs:   . Lack of Transportation (Medical): Not on file  . Lack of Transportation (Non-Medical): Not on file  Physical Activity:   . Days of Exercise per Week: Not on file  . Minutes of Exercise per Session: Not on file  Stress:   . Feeling of Stress : Not on file  Social Connections:   . Frequency of Communication with Friends and Family: Not on file  . Frequency of Social Gatherings with Friends and Family: Not on file  . Attends Religious Services: Not on file  . Active Member of Clubs or Organizations: Not on file  . Attends  Archivist Meetings: Not on file  . Marital Status: Not on file    Review of Systems  Constitutional: Negative for chills, fatigue and fever.  HENT: Negative for congestion, ear pain and sore throat.   Respiratory: Negative for cough and shortness of breath.   Cardiovascular: Negative for chest pain.  Gastrointestinal: Negative for abdominal pain, constipation, diarrhea, nausea and vomiting.  Genitourinary: Negative for dysuria and urgency.  Musculoskeletal: Positive for arthralgias (rt knee pain. meloxicam helps ). Negative for myalgias.  Skin: Negative for rash.  Neurological: Negative for dizziness and headaches.  Psychiatric/Behavioral: Negative for dysphoric mood. The patient is not nervous/anxious.      Objective:  BP (!) 134/56   Pulse 78   Temp (!) 97.5 F (36.4 C)   Resp 14   Ht 5\' 1"  (1.549 m)   Wt 114 lb 9.6 oz (52 kg)   BMI 21.65 kg/m   BP/Weight 10/05/2020 07/30/2018 9/93/7169  Systolic BP 678 938 101  Diastolic BP 56 61 56  Wt. (Lbs) 114.6 110 -  BMI 21.65 20.12 -    Physical Exam Vitals reviewed.  Constitutional:      Appearance: Normal appearance. She is normal weight.  Cardiovascular:     Rate and Rhythm: Normal rate and regular rhythm.     Pulses: Normal pulses.     Heart sounds: Normal heart sounds.  Pulmonary:     Effort: Pulmonary effort is normal. No respiratory distress.     Breath sounds: Normal breath sounds.  Abdominal:     General: Abdomen is flat. Bowel sounds are normal.     Palpations: Abdomen is soft.     Tenderness: There is no abdominal tenderness.  Neurological:     Mental Status: She is alert and oriented to person, place, and time.  Psychiatric:        Mood and Affect: Mood normal.        Behavior: Behavior normal.     Diabetic Foot Exam - Simple   No data filed       Lab Results  Component Value Date   WBC 5.5 10/05/2020   HGB 13.0 10/05/2020   HCT 40.3 10/05/2020   PLT 228 10/05/2020   GLUCOSE 82  10/05/2020   ALT 15 10/05/2020   AST 19 10/05/2020   NA 141 10/05/2020   K 4.5 10/05/2020   CL 103 10/05/2020   CREATININE 0.62 10/05/2020   BUN 17 10/05/2020   CO2 27 10/05/2020   TSH 1.720 10/05/2020   INR 1.01 08/24/2017      Assessment & Plan:   1. Mixed hyperlipidemia Patient is not fasting today.  At her last cholesterol  check within the last year her cholesterol was well controlled on the pravastatin.  We will continue this current dose. We will schedule patient to return for annual wellness visit in mid November and have her fast so we can check her cholesterol. - pravastatin (PRAVACHOL) 40 MG tablet; Take 1 tablet (40 mg total) by mouth at bedtime.  Dispense: 30 tablet; Refill: 0  2. Localized osteoporosis without current pathological fracture Recommend discontinue alendronate.  Patient is due for a drug holiday. Recommend bone density test however will have Dr. Holly Bodily discuss at her next visit for her annual wellness visit.  Patient is very overwhelmed and busy with her husband's medical care. - VITAMIN D 25 Hydroxy (Vit-D Deficiency, Fractures); Future  3. Primary hypertension Well controlled.  No changes to medicines.  Continue to work on eating a healthy diet and exercise.  Labs drawn today.  - CBC with Differential/Platelet - Comprehensive metabolic panel - TSH - lisinopril (ZESTRIL) 40 MG tablet; Take 1 tablet (40 mg total) by mouth 2 (two) times daily.  Dispense: 180 tablet; Refill: 0  4. Need for immunization against influenza - Flu Vaccine QUAD High Dose(Fluad)  5. History of colon cancer - CEA  6. Ovarian mass, left - CA 125  7. Chronic pain of right knee - meloxicam (MOBIC) 7.5 MG tablet; Take 1 tablet (7.5 mg total) by mouth daily.  Dispense: 30 tablet; Refill: 2    Meds ordered this encounter  Medications  . pravastatin (PRAVACHOL) 40 MG tablet    Sig: Take 1 tablet (40 mg total) by mouth at bedtime.    Dispense:  30 tablet    Refill:  0  .  lisinopril (ZESTRIL) 40 MG tablet    Sig: Take 1 tablet (40 mg total) by mouth 2 (two) times daily.    Dispense:  180 tablet    Refill:  0  . meloxicam (MOBIC) 7.5 MG tablet    Sig: Take 1 tablet (7.5 mg total) by mouth daily.    Dispense:  30 tablet    Refill:  2    Orders Placed This Encounter  Procedures  . Flu Vaccine QUAD High Dose(Fluad)  . CBC with Differential/Platelet  . Comprehensive metabolic panel  . TSH  . VITAMIN D 25 Hydroxy (Vit-D Deficiency, Fractures)  . CEA  . CA 125     Follow-up: No follow-ups on file.  An After Visit Summary was printed and given to the patient.  [name] [facility] [phone]

## 2020-10-06 ENCOUNTER — Encounter: Payer: Self-pay | Admitting: Family Medicine

## 2020-10-06 LAB — COMPREHENSIVE METABOLIC PANEL WITH GFR
ALT: 15 [IU]/L (ref 0–32)
AST: 19 [IU]/L (ref 0–40)
Albumin/Globulin Ratio: 2 (ref 1.2–2.2)
Albumin: 4.5 g/dL (ref 3.6–4.6)
Alkaline Phosphatase: 85 [IU]/L (ref 44–121)
BUN/Creatinine Ratio: 27 (ref 12–28)
BUN: 17 mg/dL (ref 8–27)
Bilirubin Total: 0.3 mg/dL (ref 0.0–1.2)
CO2: 27 mmol/L (ref 20–29)
Calcium: 9.7 mg/dL (ref 8.7–10.3)
Chloride: 103 mmol/L (ref 96–106)
Creatinine, Ser: 0.62 mg/dL (ref 0.57–1.00)
GFR calc Af Amer: 96 mL/min/{1.73_m2}
GFR calc non Af Amer: 83 mL/min/{1.73_m2}
Globulin, Total: 2.3 g/dL (ref 1.5–4.5)
Glucose: 82 mg/dL (ref 65–99)
Potassium: 4.5 mmol/L (ref 3.5–5.2)
Sodium: 141 mmol/L (ref 134–144)
Total Protein: 6.8 g/dL (ref 6.0–8.5)

## 2020-10-06 LAB — CBC WITH DIFFERENTIAL/PLATELET
Basophils Absolute: 0.1 10*3/uL (ref 0.0–0.2)
Basos: 1 %
EOS (ABSOLUTE): 0.2 10*3/uL (ref 0.0–0.4)
Eos: 4 %
Hematocrit: 40.3 % (ref 34.0–46.6)
Hemoglobin: 13 g/dL (ref 11.1–15.9)
Immature Grans (Abs): 0 10*3/uL (ref 0.0–0.1)
Immature Granulocytes: 0 %
Lymphocytes Absolute: 1.4 10*3/uL (ref 0.7–3.1)
Lymphs: 26 %
MCH: 28.7 pg (ref 26.6–33.0)
MCHC: 32.3 g/dL (ref 31.5–35.7)
MCV: 89 fL (ref 79–97)
Monocytes Absolute: 0.5 10*3/uL (ref 0.1–0.9)
Monocytes: 9 %
Neutrophils Absolute: 3.3 10*3/uL (ref 1.4–7.0)
Neutrophils: 60 %
Platelets: 228 10*3/uL (ref 150–450)
RBC: 4.53 x10E6/uL (ref 3.77–5.28)
RDW: 13.2 % (ref 11.7–15.4)
WBC: 5.5 10*3/uL (ref 3.4–10.8)

## 2020-10-06 LAB — TSH: TSH: 1.72 u[IU]/mL (ref 0.450–4.500)

## 2020-10-06 LAB — CA 125: Cancer Antigen (CA) 125: 7.5 U/mL (ref 0.0–38.1)

## 2020-10-06 LAB — CEA: CEA: 0.6 ng/mL (ref 0.0–4.7)

## 2020-10-08 ENCOUNTER — Other Ambulatory Visit: Payer: Self-pay | Admitting: Family Medicine

## 2020-10-08 DIAGNOSIS — E782 Mixed hyperlipidemia: Secondary | ICD-10-CM

## 2020-10-27 ENCOUNTER — Other Ambulatory Visit: Payer: Self-pay

## 2020-10-27 ENCOUNTER — Ambulatory Visit (INDEPENDENT_AMBULATORY_CARE_PROVIDER_SITE_OTHER): Payer: Medicare HMO | Admitting: Family Medicine

## 2020-10-27 ENCOUNTER — Other Ambulatory Visit: Payer: Self-pay | Admitting: Gynecologic Oncology

## 2020-10-27 ENCOUNTER — Encounter: Payer: Self-pay | Admitting: Family Medicine

## 2020-10-27 VITALS — BP 128/62 | HR 62 | Temp 97.4°F | Ht 61.0 in | Wt 113.8 lb

## 2020-10-27 DIAGNOSIS — Z1239 Encounter for other screening for malignant neoplasm of breast: Secondary | ICD-10-CM

## 2020-10-27 DIAGNOSIS — M810 Age-related osteoporosis without current pathological fracture: Secondary | ICD-10-CM | POA: Diagnosis not present

## 2020-10-27 DIAGNOSIS — E782 Mixed hyperlipidemia: Secondary | ICD-10-CM

## 2020-10-27 DIAGNOSIS — N83202 Unspecified ovarian cyst, left side: Secondary | ICD-10-CM

## 2020-10-27 NOTE — Patient Instructions (Signed)
  Ms. Summerson , Thank you for taking time to come for your Medicare Wellness Visit. I appreciate your ongoing commitment to your health goals. Please review the following plan we discussed and let me know if I can assist you in the future.   These are the goals we discussed: Mammogram /bone density COVID booster-moderna  This is a list of the screening recommended for you and due dates:  Health Maintenance  Topic Date Due  . Tetanus Vaccine  Never done  . DEXA scan (bone density measurement)  Never done  . Pneumonia vaccines (1 of 2 - PCV13) Never done  . Flu Shot  Completed  . COVID-19 Vaccine  Completed

## 2020-10-27 NOTE — Progress Notes (Unsigned)
[place] ordered for Randpolph per pt request

## 2020-10-27 NOTE — Progress Notes (Signed)
Subjective:  Patient ID: Pamela Price, female    DOB: [birth year]  Age: 86 y.o. MRN: [id number]  Chief Complaint  Patient presents with  . Annual Exam    AWV    HPI Encounter for general adult medical examination without abnormal findings  Physical ("At Risk" items are starred): Patient's last physical exam was 1 year ago .  Smoking: Life-long non-smoker ;  Physical Activity: Exercises at least 3 times per week ;  Alcohol/Drug Use: Is a non-drinker ; No illicit drug use ;  Patient is not afflicted from Stress Incontinence and Urge Incontinence  Safety: reviewed ; Patient wears a seat belt, has smoke detectors, has carbon monoxide detectors, practices appropriate gun safety, and wears sunscreen with extended sun exposure. Dental Care: biannual cleanings, brushes and flosses daily. Ophthalmology/Optometry: Annual visit. Macular degeneration Hearing loss: none Vision impairments: none  Needs DEXA-osteoporosis-off medication-11/19-osteoporosis on DEXA  Needs mammogram-mammogram 11/19-negative  Left ovarian cyst -ultrasound pelvis/transvaginal-complex cystic left ovarian mass 8.2 cm -no significant change in overall volume of lesions since prior study. RECOMMENDATION: surgical assessment or MR imagining Virtual visit f/u post testing-Surgical removal-declined by patient, repeat imaging and appointment to follow-pt has not had follow up CA125 normal  colon screen-01/18/19-diverticulosis of sigmoid colon. Large circumferential multilobular polyp at the IC valve-02/11/19-right hemicolectomy-invasive adenocarcinoma-Grade 1-lumph nodes negative for metastatic carcinoma 0/19.    hip replacement Knee pain-episodically  Functional Status Survey:   pt with concerns about seeing due to macular degeneration-receives shots-otherwise pf has difficulty with work finding in conversation. No additional functional concerns. Pt continues to drive  Social Hx  pts husband requires a care-pt keeps  pt in the home -no assistance. Pts son schizophrenic-stablized, pt bought him a home. Social History   Socioeconomic History  . Marital status: Married    Spouse name: Not on file  . Number of children: Not on file  . Years of education: Not on file  . Highest education level: Not on file  Occupational History  . Not on file  Tobacco Use  . Smoking status: Former Smoker    Years: 5.00    Types: Cigarettes  . Smokeless tobacco: Never Used  . Tobacco comment: quit 40 years ago   Substance and Sexual Activity  . Alcohol use: Yes    Comment: occasionally   . Drug use: No  . Sexual activity: Not on file  Other Topics Concern  . Not on file  Social History Narrative  . Not on file   Social Determinants of Health   Financial Resource Strain:   . Difficulty of Paying Living Expenses: Not on file  Food Insecurity:   . Worried About Charity fundraiser in the Last Year: Not on file  . Ran Out of Food in the Last Year: Not on file  Transportation Needs:   . Lack of Transportation (Medical): Not on file  . Lack of Transportation (Non-Medical): Not on file  Physical Activity:   . Days of Exercise per Week: Not on file  . Minutes of Exercise per Session: Not on file  Stress:   . Feeling of Stress : Not on file  Social Connections:   . Frequency of Communication with Friends and Family: Not on file  . Frequency of Social Gatherings with Friends and Family: Not on file  . Attends Religious Services: Not on file  . Active Member of Clubs or Organizations: Not on file  . Attends Archivist Meetings: Not on file  . Marital  Status: Not on file   Past Medical History:  Diagnosis Date  . Arthritis   . HLD (hyperlipidemia)   . Hypertension   . Macular degeneration    bilateral   . Osteoporosis   . Varicose veins of both lower extremities     Objective:   Today's Vitals   10/27/20 0910  BP: 128/62  Pulse: 62  Temp: (!) 97.4 F (36.3 C)  TempSrc: Temporal  SpO2:  98%  Weight: 113 lb 12.8 oz (51.6 kg)  Height: 5\' 1"  (1.549 m)   Body mass index is 21.5 kg/m.  BP/Weight 10/05/2020 07/30/2018 5/00/9381  Systolic BP 829 937 169  Diastolic BP 56 61 56  Wt. (Lbs) 114.6 110 -  BMI 21.65 20.12 -     Lab Results  Component Value Date   WBC 5.5 10/05/2020   HGB 13.0 10/05/2020   HCT 40.3 10/05/2020   PLT 228 10/05/2020   GLUCOSE 82 10/05/2020   ALT 15 10/05/2020   AST 19 10/05/2020   NA 141 10/05/2020   K 4.5 10/05/2020   CL 103 10/05/2020   CREATININE 0.62 10/05/2020   BUN 17 10/05/2020   CO2 27 10/05/2020   TSH 1.720 10/05/2020   INR 1.01 08/24/2017    Assessment & Plan:   These are the goals we discussed: complete follow up for complex ovarian cyst. Continue follow up for macular degeneration with eye specialist  This is a list of the screening recommended for you and due dates:  Health Maintenance  Topic Date Due  . Tetanus Vaccine  Never done  . DEXA scan (bone density measurement)  Never done  . Pneumonia vaccines (1 of 2 - PCV13) Never done  . Flu Shot  Completed  . COVID-19 Vaccine  Completed     AN INDIVIDUALIZED CARE PLAN: was established or reinforced today.   SELF MANAGEMENT: The patient and I together assessed ways to personally work towards obtaining the recommended goals  Support needs The patient and/or family needs were assessed and services were offered -suggestions of placement.for husband rejected by patient.   My nursing staff have aided in the documentation of this note on the behalf of [place], MD,as directed by  Mertha Baars, MD and thoroughly reviewed by Mertha Baars, MD.  Follow-up:  Mertha Baars, MD [place] [phone]

## 2020-10-28 LAB — LIPID PANEL
Chol/HDL Ratio: 3.2 ratio (ref 0.0–4.4)
Cholesterol, Total: 180 mg/dL (ref 100–199)
HDL: 56 mg/dL
LDL Chol Calc (NIH): 101 mg/dL — ABNORMAL HIGH (ref 0–99)
Triglycerides: 130 mg/dL (ref 0–149)
VLDL Cholesterol Cal: 23 mg/dL (ref 5–40)

## 2020-10-28 LAB — CARDIOVASCULAR RISK ASSESSMENT

## 2020-10-28 LAB — VITAMIN D 25 HYDROXY (VIT D DEFICIENCY, FRACTURES): Vit D, 25-Hydroxy: 39.5 ng/mL (ref 30.0–100.0)

## 2020-10-29 ENCOUNTER — Telehealth: Payer: Self-pay | Admitting: *Deleted

## 2020-10-29 DIAGNOSIS — H353221 Exudative age-related macular degeneration, left eye, with active choroidal neovascularization: Secondary | ICD-10-CM | POA: Diagnosis not present

## 2020-10-29 DIAGNOSIS — H353211 Exudative age-related macular degeneration, right eye, with active choroidal neovascularization: Secondary | ICD-10-CM | POA: Diagnosis not present

## 2020-10-29 NOTE — Telephone Encounter (Signed)
Faxed order and authorization to [facility] and gave the patient the appt for 11/23 at 3 pm. Patient will call back if she needs to reschedule appt

## 2020-11-02 ENCOUNTER — Other Ambulatory Visit: Payer: Self-pay

## 2020-11-02 ENCOUNTER — Encounter: Payer: Self-pay | Admitting: Family Medicine

## 2020-11-02 DIAGNOSIS — N83202 Unspecified ovarian cyst, left side: Secondary | ICD-10-CM

## 2020-11-06 ENCOUNTER — Telehealth: Payer: Self-pay | Admitting: *Deleted

## 2020-11-06 NOTE — Telephone Encounter (Signed)
[facility] hospital for the results of the [place]. Patient didn't have the [place] on 11/23. Called the patient and left a message to call the office back.

## 2020-11-06 NOTE — Telephone Encounter (Signed)
Patient called back and rescheduled her [place] to 11/29 at 10:30 am

## 2020-11-09 DIAGNOSIS — N838 Other noninflammatory disorders of ovary, fallopian tube and broad ligament: Secondary | ICD-10-CM | POA: Diagnosis not present

## 2020-11-09 DIAGNOSIS — N83202 Unspecified ovarian cyst, left side: Secondary | ICD-10-CM | POA: Diagnosis not present

## 2020-11-10 ENCOUNTER — Ambulatory Visit (INDEPENDENT_AMBULATORY_CARE_PROVIDER_SITE_OTHER): Payer: Medicare HMO

## 2020-11-10 ENCOUNTER — Other Ambulatory Visit: Payer: Self-pay

## 2020-11-10 DIAGNOSIS — Z23 Encounter for immunization: Secondary | ICD-10-CM

## 2020-11-10 NOTE — Progress Notes (Signed)
   Covid-19 Vaccination Clinic  Name:  Pamela Price    MRN: [id number] DOB: 08-20-1933  11/10/2020  Pamela Price was observed post Covid-19 immunization for 15 minutes without incident. She was provided with Vaccine Information Sheet and instruction to access the V-Safe system.   Pamela Price was instructed to call 911 with any severe reactions post vaccine: Marland Kitchen Difficulty breathing  . Swelling of face and throat  . A fast heartbeat  . A bad rash all over body  . Dizziness and weakness

## 2020-11-16 ENCOUNTER — Telehealth: Payer: Self-pay | Admitting: *Deleted

## 2020-11-16 NOTE — Telephone Encounter (Signed)
Called and left the patient a message to call the office back. Patient needs to be scheduled for a phone visit with Dr Denman George

## 2020-11-17 ENCOUNTER — Telehealth: Payer: Self-pay | Admitting: *Deleted

## 2020-11-17 NOTE — Telephone Encounter (Signed)
Called and scheduled the patient for a phone visit appt on 12/9

## 2020-11-19 ENCOUNTER — Other Ambulatory Visit: Payer: Self-pay | Admitting: Family Medicine

## 2020-11-19 ENCOUNTER — Encounter: Payer: Self-pay | Admitting: Gynecologic Oncology

## 2020-11-19 ENCOUNTER — Inpatient Hospital Stay: Payer: Medicare HMO | Attending: Gynecologic Oncology | Admitting: Gynecologic Oncology

## 2020-11-19 DIAGNOSIS — N83202 Unspecified ovarian cyst, left side: Secondary | ICD-10-CM | POA: Diagnosis not present

## 2020-11-19 NOTE — Progress Notes (Signed)
Gynecologic Oncology Telehealth Follow-up Visit Note: Gyn-Onc  I connected with Pamela Price on 11/19/20 at  2:30 PM EST by telephone and verified that I am speaking with the correct person using two identifiers.  I discussed the limitations, risks, security and privacy concerns of performing an evaluation and management service by telemedicine and the availability of in-person appointments. I also discussed with the patient that there may be a patient responsible charge related to this service. The patient expressed understanding and agreed to proceed.  Other persons participating in the visit and their role in the encounter: none.  Patient's location: home Provider's location: [place]  Chief Complaint:  Chief Complaint  Patient presents with  . left ovarian cyst    Assessment/Plan:  [name]  is a 86 y.o.  year old with a slowly growing, asymptomatic complex left adnexal cyst, and normal CA 125, incidentally identified on post-surgical CT scan in March, 2020. The cyst increased slightly on repeat imaging in November, 2021 (compared to February, 2021). She continues to strongly desire to avoid surgery due to her responsibilities caring for her 84 year old legally blind husband and son with schizophrenia. Therefore I offered surgery but also offered close follow-up with repeat imaging in 6 months to document stability or slow growth. She elected for the latter. We will schedule her for a follow-up US in [place] in June, 2022 and a follow-up phone visit with me after this scan to discuss the results.   We reviewed concerning symptoms such as sharp abdominal pains, distention, bloating, early satiety, for which she should notify me sooner than this if they develop.  She is in agreement.  HPI: [name] is a 86 year old P3 who is seen in consultation at the request of Dr. Modesta Messing for a complex left ovarian cyst.  The patient's history began  when she was noted to have a colonic mass identified on colonoscopy in the winter 2020.  This subsequently was followed with a laparoscopic hemicolectomy performed by Dr. Lilia Pro in [place] in February 12, 2019.  The patient reports the procedure was uncomplicated.  Subsequently malignancy, per patient stage I, was identified in the pathology specimen.  No further adjuvant therapy was recommended.  As part of work-up of her cancer diagnosis and subsequent staging a CT scan of the abdomen and pelvis was performed on February 22, 2019.  This revealed that she was status post right hemicolectomy.  The uterus appeared unremarkable.  A multiloculated cystic structure measuring 8 x 5 cm was noted in the left adnexal region.  There is no right adnexal abnormality noted.  No ascites no carcinomatosis, no lymphadenopathy was appreciated.  A transvaginal ultrasound scan was performed on March 29, 2019.  This confirmed a normal-appearing uterus measuring 6.5 x 4 x 2.6 cm with a 4 mm endometrial thickness.  The right ovary measured 1.4 cm and was grossly normal.  The left ovary measured 6.9 x 7.4 x 4.2 cm with a multicystic abnormality noted within.  The largest single cystic area measured 5.7 cm in diameter.  At least 1 septa appeared to be somewhat thickened.  There is no abnormal free fluid.  Ca1 25 was drawn on April 04, 2019 and was normal at 10.  Follow-up ultrasound scan was performed on May 30, 2019.  This showed a stable appearing left ovarian complex cyst measuring 7.4 x 4.4 x 4.9 cm that was multicystic and avascular.  No cul-de-sac free fluid was identified.  The patient  remains asymptomatic from this cyst.  She reports a medical history significant for macular degeneration for which she receives regular bevacizumab injections, she also has a history of hyperlipidemia hypertension and osteoporosis.  Additionally she has a history of colon cancer at age 86.  Her past surgical history is significant for 2 prior  cesarean sections, tubal ligation, hip replacement, and a hemicolectomy (right) in March 2020.  She is a non-smoker.  She lives with her husband who has severe COPD, is legally blind, has poor wound healing, and was admitted to hospital in August 2020 with a lower extremity fracture after a fall.  She is his primary caretaker.  The patient also has a schizophrenic son who is age 76.  He lives in [place], and requires significant assistance from her.  The patient has had one prior vaginal delivery and 2 prior cesarean sections.  Due to the condition of her husband who is ailing after hip fracture and her son who has schizophrenia and requires a great deal of attention from her, she elected for expectant management rather than surgical intervention.  Repeat ultrasound performed in [facility] on January 15, 2020 revealed a uterus measuring 5.6 x 3.1 x 3.8 cm.  A 4 mm endometrial stripe.  Right ovary not visualized.  Left ovary measured 8.2 x 4.1 x 6.1 cm containing a complex cystic mass containing multiple septations of varying thickness with some slight nodularity.  Question of a small mural nodule.  Malignancy could not be excluded.  No definite blood flow within the septations or areas of nodularity on color Doppler. She continued to not desire surgical intervention unless absolutely necessary at that time.   Interval Hx:  Follow-up ultrasound scan on 11/09/2020 showed right ovary not visualized, uterus measuring 5.3 x 2 x 4 cm with a 3 mm endometrium.  The left ovary was replaced by a complicated cystic mass measuring 8.3 x 4.1 x 8.8 cm with multiple septations, which are thin and additional areas which are thicker and irregular.  There was several questionable areas of mural nodularity.  There was no internal blood flow on color Doppler imaging within the septations or within the areas of mural nodularity.  No other pelvic findings.  This represented a small growth in the cyst since the  previous exam in February 2021.  She continued to be symptom free.   Current Meds:  Outpatient Encounter Medications as of 11/19/2020  Medication Sig  . lisinopril (ZESTRIL) 40 MG tablet Take 1 tablet (40 mg total) by mouth 2 (two) times daily.  . meloxicam (MOBIC) 7.5 MG tablet Take 1 tablet (7.5 mg total) by mouth daily.  . pravastatin (PRAVACHOL) 40 MG tablet TAKE 1 TABLET AT BEDTIME   No facility-administered encounter medications on file as of 11/19/2020.    Allergy:  Allergies  Allergen Reactions  . Sulfa Antibiotics Rash    Broke out in a rash 40 years ago  Broke out in a rash 40 years ago     Social Hx:   Social History   Socioeconomic History  . Marital status: Married    Spouse name: Not on file  . Number of children: Not on file  . Years of education: Not on file  . Highest education level: Not on file  Occupational History  . Not on file  Tobacco Use  . Smoking status: Former Smoker    Years: 5.00    Types: Cigarettes  . Smokeless tobacco: Never Used  . Tobacco comment: quit 40  years ago   Substance and Sexual Activity  . Alcohol use: Yes    Comment: occasionally   . Drug use: No  . Sexual activity: Not on file  Other Topics Concern  . Not on file  Social History Narrative  . Not on file   Social Determinants of Health   Financial Resource Strain: Not on file  Food Insecurity: Not on file  Transportation Needs: Not on file  Physical Activity: Not on file  Stress: Not on file  Social Connections: Not on file  Intimate Partner Violence: Not on file    Past Surgical Hx:  Past Surgical History:  Procedure Laterality Date  . BREAST LUMPECTOMY W/ NEEDLE LOCALIZATION     axillary tail of the left  breast   . COLON SURGERY  02/2019   for stage I colon cancer  . TOTAL HIP ARTHROPLASTY Right 08/30/2017   Procedure: RIGHT TOTAL HIP ARTHROPLASTY ANTERIOR APPROACH;  Surgeon: Gaynelle Arabian, MD;  Location: [facility];  Service: Orthopedics;  Laterality:  Right;    Past Medical Hx:  Past Medical History:  Diagnosis Date  . Arthritis   . History of right hemicolectomy   . HLD (hyperlipidemia)   . Hypertension   . Macular degeneration    bilateral   . Osteoporosis   . Varicose veins of both lower extremities     Past Gynecological History:  See HPI No LMP recorded. Patient is postmenopausal.  Family Hx: History reviewed. No pertinent family history.  Review of Systems:  Constitutional  Feels well,   ENT Normal appearing ears and nares bilaterally Skin/Breast  No rash, sores, jaundice, itching, dryness Cardiovascular  No chest pain, shortness of breath, or edema  Pulmonary  No cough or wheeze.  Gastro Intestinal  No nausea, vomitting, or diarrhoea. No bright red blood per rectum, no abdominal pain, change in bowel movement, or constipation.  Genito Urinary  No frequency, urgency, dysuria, no bleeding or pelvic pain Musculo Skeletal  No myalgia, arthralgia, joint swelling or pain  Neurologic  No weakness, numbness, change in gait,  Psychology  No depression, anxiety, insomnia.   Physical Exam: Deferred due to telephone encounter. I discussed the assessment and treatment plan with the patient. The patient was provided with an opportunity to ask questions and all were answered. The patient agreed with the plan and demonstrated an understanding of the instructions.   The patient was advised to call back or see an in-person evaluation if the symptoms worsen or if the condition fails to improve as anticipated.   I provided 15 minutes of non face-to-face telephone visit time (because the webex visit failed due to technical issues) during this encounter, and > 50% was spent counseling as documented under my assessment & plan.   Thereasa Solo, MD  11/19/2020, 2:40 PM

## 2020-12-12 ENCOUNTER — Other Ambulatory Visit: Payer: Self-pay | Admitting: Physician Assistant

## 2020-12-12 DIAGNOSIS — I10 Essential (primary) hypertension: Secondary | ICD-10-CM

## 2021-01-06 ENCOUNTER — Other Ambulatory Visit: Payer: Self-pay

## 2021-01-06 DIAGNOSIS — E782 Mixed hyperlipidemia: Secondary | ICD-10-CM

## 2021-01-06 MED ORDER — PRAVASTATIN SODIUM 40 MG PO TABS: 40.0000 mg | ORAL_TABLET | Freq: Every day | ORAL | 0 refills | Status: DC

## 2021-01-21 DIAGNOSIS — H353231 Exudative age-related macular degeneration, bilateral, with active choroidal neovascularization: Secondary | ICD-10-CM | POA: Diagnosis not present

## 2021-01-26 ENCOUNTER — Other Ambulatory Visit: Payer: Self-pay

## 2021-01-26 DIAGNOSIS — E782 Mixed hyperlipidemia: Secondary | ICD-10-CM

## 2021-01-26 MED ORDER — PRAVASTATIN SODIUM 40 MG PO TABS: 40.0000 mg | ORAL_TABLET | Freq: Every day | ORAL | 1 refills | Status: DC

## 2021-04-15 DIAGNOSIS — H353231 Exudative age-related macular degeneration, bilateral, with active choroidal neovascularization: Secondary | ICD-10-CM | POA: Diagnosis not present

## 2021-06-01 NOTE — Progress Notes (Signed)
Subjective:  Patient ID: Pamela Price, female    DOB: [birth year]  Age: 87 y.o. MRN: [id number]  Chief Complaint  Patient presents with   Hyperlipidemia   Hypertension     HPI  Pamela Price is an 87 year old Caucasian female that presents for follow-up of hypertension and hyperlipidemia. She states her spouse died last week, funeral was 4-days ago. She tells me that she had helped care for him for two years. She is scheduled to attend grief counseling with [facility]. She added that she helps care for her 5 year old son that has schizophrenia. States she has a son in [place] and [place] that are supportive.  Pamela Price has a past history of osteoporosis. She was treated previously with Fosamax. She is currently on drug holiday. She has not had a recent DEXA scan.   Hypertension She was last seen for hypertension 7 months ago.  BP at that visit was 134/56. Management since that visit includes Zestril 40 mg BID.  She reports excellent compliance with treatment. She is not having side effects.  She is following a  Vegetarian  diet. She is not exercising. She does not smoke.  Use of agents associated with hypertension: none.   Outside blood pressures are not being checked. Symptoms: No chest pain No chest pressure  No palpitations No syncope  No dyspnea No orthopnea  No paroxysmal nocturnal dyspnea No lower extremity edema   Pertinent labs: Lab Results  Component Value Date   CHOL 180 10/27/2020   HDL 56 10/27/2020   LDLCALC 101 (H) 10/27/2020   TRIG 130 10/27/2020   CHOLHDL 3.2 10/27/2020   Lab Results  Component Value Date   NA 141 10/05/2020   K 4.5 10/05/2020   CREATININE 0.62 10/05/2020   GFRNONAA 83 10/05/2020   GFRAA 96 10/05/2020   GLUCOSE 82 10/05/2020     The ASCVD Risk score (Goff DC Jr., et al., 2013) failed to calculate for the following reasons:   The 2013 ASCVD risk score is only valid for ages 43 to 83   Lipid/Cholesterol,  Follow-up  Last lipid panel Other pertinent labs  Lab Results  Component Value Date   CHOL 180 10/27/2020   HDL 56 10/27/2020   LDLCALC 101 (H) 10/27/2020   TRIG 130 10/27/2020   CHOLHDL 3.2 10/27/2020   Lab Results  Component Value Date   ALT 15 10/05/2020   AST 19 10/05/2020   PLT 228 10/05/2020   TSH 1.720 10/05/2020     She was last seen for this 7 months ago.  Management since that visit includes Pravastatin 40 mg daily.  She reports excellent compliance with treatment. She is not having side effects.   Symptoms: No chest pain No chest pressure/discomfort  No dyspnea No lower extremity edema  No numbness or tingling of extremity No orthopnea  No palpitations No paroxysmal nocturnal dyspnea  No speech difficulty No syncope   Current diet: well balanced, vegetarian Current exercise: none  The ASCVD Risk score Mikey Bussing DC Jr., et al., 2013) failed to calculate for the following reasons:   The 2013 ASCVD risk score is only valid for ages 18 to 85   Hypertension-Taking lisinopril daily  Cholesterol-Takes pravastatin daily. Has a decreased appetite and concerned with her weight loss usually weighs around 113 lbs.  Question rather or not she should be taking something for her bones. States she has not taken anything for at least the past 2 years. Formerly was on alendronate.   Husband of  64 years d recently passed away. Had funeral this past weekend.  Current Outpatient Medications on File Prior to Visit  Medication Sig Dispense Refill   lisinopril (ZESTRIL) 40 MG tablet TAKE 1 TABLET TWICE A DAY 180 tablet 0   meloxicam (MOBIC) 7.5 MG tablet Take 1 tablet (7.5 mg total) by mouth daily. 30 tablet 2   pravastatin (PRAVACHOL) 40 MG tablet Take 1 tablet (40 mg total) by mouth at bedtime. 90 tablet 1   No current facility-administered medications on file prior to visit.   Past Medical History:  Diagnosis Date   Arthritis    History of right hemicolectomy    HLD  (hyperlipidemia)    Hypertension    Macular degeneration    bilateral    Osteoporosis    Varicose veins of both lower extremities    Past Surgical History:  Procedure Laterality Date   BREAST LUMPECTOMY W/ NEEDLE LOCALIZATION     axillary tail of the left  breast    COLON SURGERY  02/2019   for stage I colon cancer   TOTAL HIP ARTHROPLASTY Right 08/30/2017   Procedure: RIGHT TOTAL HIP ARTHROPLASTY ANTERIOR APPROACH;  Surgeon: Gaynelle Arabian, MD;  Location: [facility];  Service: Orthopedics;  Laterality: Right;    No family history on file. Social History   Socioeconomic History   Marital status: Married    Spouse name: Not on file   Number of children: Not on file   Years of education: Not on file   Highest education level: Not on file  Occupational History   Not on file  Tobacco Use   Smoking status: Former    Years: 5.00    Pack years: 0.00    Types: Cigarettes   Smokeless tobacco: Never   Tobacco comments:    quit 40 years ago   Substance and Sexual Activity   Alcohol use: Yes    Comment: occasionally    Drug use: No   Sexual activity: Not on file  Other Topics Concern   Not on file  Social History Narrative   Not on file   Social Determinants of Health   Financial Resource Strain: Not on file  Food Insecurity: Not on file  Transportation Needs: Not on file  Physical Activity: Not on file  Stress: Not on file  Social Connections: Not on file    Review of Systems  Constitutional:  Negative for chills, fatigue and fever.  HENT:  Negative for congestion, ear pain, rhinorrhea and sore throat.   Respiratory:  Negative for cough and shortness of breath.   Cardiovascular:  Negative for chest pain.  Gastrointestinal:  Negative for abdominal pain, constipation, diarrhea, nausea and vomiting.  Genitourinary:  Negative for dysuria and urgency.  Musculoskeletal:  Negative for back pain and myalgias.  Neurological:  Negative for dizziness, weakness, light-headedness  and headaches.  Psychiatric/Behavioral:  Negative for dysphoric mood. The patient is not nervous/anxious.     Objective:  BP 130/70   Pulse 68   Temp (!) 96.9 F (36.1 C)   Ht 5\' 2"  (1.575 m)   Wt 106 lb (48.1 kg)   SpO2 95%   BMI 19.39 kg/m    BP/Weight 10/27/2020 10/05/2020 3/50/0938  Systolic BP 182 993 716  Diastolic BP 62 56 61  Wt. (Lbs) 113.8 114.6 110  BMI 21.5 21.65 20.12    Physical Exam Vitals reviewed.  Constitutional:      Appearance: Normal appearance. She is normal weight.  Neck:  Vascular: No carotid bruit.  Cardiovascular:     Rate and Rhythm: Normal rate and regular rhythm.     Pulses: Normal pulses.     Heart sounds: Normal heart sounds.  Pulmonary:     Effort: Pulmonary effort is normal. No respiratory distress.     Breath sounds: Normal breath sounds.  Abdominal:     General: Abdomen is flat. Bowel sounds are normal.     Palpations: Abdomen is soft.     Tenderness: There is no abdominal tenderness.  Neurological:     Mental Status: She is alert and oriented to person, place, and time.  Psychiatric:        Mood and Affect: Mood normal.        Behavior: Behavior normal.     Lab Results  Component Value Date   WBC 5.5 10/05/2020   HGB 13.0 10/05/2020   HCT 40.3 10/05/2020   PLT 228 10/05/2020   GLUCOSE 82 10/05/2020   CHOL 180 10/27/2020   TRIG 130 10/27/2020   HDL 56 10/27/2020   LDLCALC 101 (H) 10/27/2020   ALT 15 10/05/2020   AST 19 10/05/2020   NA 141 10/05/2020   K 4.5 10/05/2020   CL 103 10/05/2020   CREATININE 0.62 10/05/2020   BUN 17 10/05/2020   CO2 27 10/05/2020   TSH 1.720 10/05/2020   INR 1.01 08/24/2017      Assessment & Plan:   1. Mixed hyperlipidemia - Lipid panel  2. Primary hypertension - CBC with Differential/Platelet - Comprehensive metabolic panel  3. Grief -attend grief counseling at [facility] as scheduled  4. Localized osteoporosis without current pathological fracture - DG Bone Density -  VITAMIN D 25 Hydroxy (Vit-D Deficiency, Fractures)    Attend grief counseling with [facility] as scheduled Continue medications We will call you with lab results and appointment for DEXA scan to check for osteoporosis Follow-up 43-months, fasting with Dr Tobie Poet  Follow-up: 90-month  An After Visit Summary was printed and given to the patient.  Signed, Rip Harbour, NP [place] [phone]

## 2021-06-02 ENCOUNTER — Encounter: Payer: Self-pay | Admitting: Nurse Practitioner

## 2021-06-02 ENCOUNTER — Ambulatory Visit (INDEPENDENT_AMBULATORY_CARE_PROVIDER_SITE_OTHER): Payer: Medicare HMO | Admitting: Nurse Practitioner

## 2021-06-02 ENCOUNTER — Other Ambulatory Visit: Payer: Self-pay

## 2021-06-02 ENCOUNTER — Other Ambulatory Visit: Payer: Self-pay | Admitting: Nurse Practitioner

## 2021-06-02 VITALS — BP 130/70 | HR 68 | Temp 96.9°F | Ht 62.0 in | Wt 106.0 lb

## 2021-06-02 DIAGNOSIS — I10 Essential (primary) hypertension: Secondary | ICD-10-CM | POA: Diagnosis not present

## 2021-06-02 DIAGNOSIS — F4321 Adjustment disorder with depressed mood: Secondary | ICD-10-CM

## 2021-06-02 DIAGNOSIS — M816 Localized osteoporosis [Lequesne]: Secondary | ICD-10-CM | POA: Diagnosis not present

## 2021-06-02 DIAGNOSIS — R69 Illness, unspecified: Secondary | ICD-10-CM | POA: Diagnosis not present

## 2021-06-02 DIAGNOSIS — E782 Mixed hyperlipidemia: Secondary | ICD-10-CM | POA: Diagnosis not present

## 2021-06-02 MED ORDER — LISINOPRIL 40 MG PO TABS: 40.0000 mg | ORAL_TABLET | Freq: Two times a day (BID) | ORAL | 0 refills | Status: DC

## 2021-06-02 NOTE — Patient Instructions (Signed)
Attend grief counseling with Hospice as scheduled Continue medications We will call you with lab results and appointment for DEXA scan to check for osteoporosis Follow-up 26-months, fasting with Dr Tobie Poet   Managing Loss, Adult People experience loss in many different ways throughout their lives. Events such as moving, changing jobs, and losing friends can create a sense of loss. The loss may be as serious as a major health change, divorce, death of a pet, or death of a loved one. All of these types of loss are likely to create a physical and emotional reaction known as grief. Grief is the result of a major change or an absence of something or someone that you count on. Grief is anormal reaction to loss. A variety of factors can affect your grieving experience, including: The nature of your loss. Your relationship to what or whom you lost. Your understanding of grief and how to manage it. Your support system. How to manage lifestyle changes Keep to your normal routine as much as possible. If you have trouble focusing or doing normal activities, it is acceptable to take some time away from your normal routine. Spend time with friends and loved ones. Eat a healthy diet, get plenty of sleep, and rest when you feel tired. How to recognize changes  The way that you deal with your grief will affect your ability to function as you normally do. When grieving, you may experience these changes: Numbness, shock, sadness, anxiety, anger, denial, and guilt. Thoughts about death. Unexpected crying. A physical sensation of emptiness in your stomach. Problems sleeping and eating. Tiredness (fatigue). Loss of interest in normal activities. Dreaming about or imagining seeing the person who died. A need to remember what or whom you lost. Difficulty thinking about anything other than your loss for a period of time. Relief. If you have been expecting the loss for a while, you may feel a sense of relief when it  happens. Follow these instructions at home: Activity Express your feelings in healthy ways, such as: Talking with others about your loss. It may be helpful to find others who have had a similar loss, such as a support group. Writing down your feelings in a journal. Doing physical activities to release stress and emotional energy. Doing creative activities like painting, sculpting, or playing or listening to music. Practicing resilience. This is the ability to recover and adjust after facing challenges. Reading some resources that encourage resilience may help you to learn ways to practice those behaviors.  General instructions Be patient with yourself and others. Allow the grieving process to happen, and remember that grieving takes time. It is likely that you may never feel completely done with some grief. You may find a way to move on while still cherishing memories and feelings about your loss. Accepting your loss is a process. It can take months or longer to adjust. Keep all follow-up visits as told by your health care provider. This is important. Where to find support To get support for managing loss: Ask your health care provider for help and recommendations, such as grief counseling or therapy. Think about joining a support group for people who are managing a loss. Where to find more information You can find more information about managing loss from: American Society of Clinical Oncology: www.cancer.net American Psychological Association: TVStereos.ch Contact a health care provider if: Your grief is extreme and keeps getting worse. You have ongoing grief that does not improve. Your body shows symptoms of grief, such as illness.  You feel depressed, anxious, or lonely. Get help right away if: You have thoughts about hurting yourself or others. If you ever feel like you may hurt yourself or others, or have thoughts about taking your own life, get help right away. You can go to your  nearest emergency department or call: Your local emergency services (911 in the [place] A suicide crisis helpline, such as the [place] at 5792800693. This is open 24 hours a day. Summary Grief is the result of a major change or an absence of someone or something that you count on. Grief is a normal reaction to loss. The depth of grief and the period of recovery depend on the type of loss and your ability to adjust to the change and process your feelings. Processing grief requires patience and a willingness to accept your feelings and talk about your loss with people who are supportive. It is important to find resources that work for you and to realize that people experience grief differently. There is not one grieving process that works for everyone in the same way. Be aware that when grief becomes extreme, it can lead to more severe issues like isolation, depression, anxiety, or suicidal thoughts. Talk with your health care provider if you have any of these issues. This information is not intended to replace advice given to you by your health care provider. Make sure you discuss any questions you have with your healthcare provider. Document Revised: 05/21/2020 Document Reviewed: 05/21/2020 Elsevier Patient Education  2022 [place] for Osteoporosis Osteoporosis causes your bones to become weak and brittle. This puts you at greater risk for bone breaks (fractures) from small bumps or falls. Making changes to your diet and increasing your physical activity can help strengthen your bones and improve your overallhealth. Calcium and vitamin D are nutrients that play an important role in bone health. Vitamin D helps your body use calcium and strengthen bones. It is important toget enough calcium and vitamin D as part of your eating plan for osteoporosis. What are tips for following this plan? Reading food labels Try to get at least 1,000 milligrams (mg) of  calcium each day. Look for foods that have at least 50 mg of calcium per serving. Talk with your health care provider about taking a calcium supplement if you do not get enough calcium from food. Do not have more than 2,500 mg of calcium each day. This is the upper limit for food and nutritional supplements combined. Too much calcium may cause constipation and prevent you from absorbing other important nutrients. Choose foods that contain vitamin D. Take a daily vitamin supplement that contains 800-1,000 international units (IU) of vitamin D. The amount may be different depending on your age, body weight, and where you live. Talk with your dietitian or health care provider about how much vitamin D is right for you. Avoid foods that have more than 300 mg of sodium per serving. Too much sodium can cause your body to lose calcium. Talk with your dietitian or health care provider about how much sodium you are allowed each day. Shopping Do not buy foods with added salt, including: Salted snacks. Angie Fava. Canned soups. Canned meats. Processed meats, such as bacon or precooked or cured meat like sausages or meat loaves. Smoked fish. Meal planning Eat balanced meals that contain protein foods, fruits and vegetables, and foods rich in calcium and vitamin D. Eat at least 5 servings of fruits and vegetables each day. Eat 5-6  oz (142-170 g) of lean meat, poultry, fish, eggs, or beans each day. Lifestyle Do not use any products that contain nicotine or tobacco, such as cigarettes, e-cigarettes, and chewing tobacco. If you need help quitting, ask your health care provider. If your health care provider recommends that you lose weight: Work with a dietitian to develop an eating plan that will help you reach your desired weight goal. Exercise for at least 30 minutes a day, 5 or more days a week, or as told by your health care provider. Work with a physical therapist to develop an exercise plan that includes  flexibility, balance, and strength exercises. Do not focus only on aerobic exercise. Do not drink alcohol if: Your health care provider tells you not to drink. You are pregnant, may be pregnant, or are planning to become pregnant. If you drink alcohol: Limit how much you use to: 0-1 drink a day for women. 0-2 drinks a day for men. Be aware of how much alcohol is in your drink. In the [place] one drink equals one 12 oz bottle of beer (355 mL), one 5 oz glass of wine (148 mL), or one 1 oz glass of hard liquor (44 mL). What foods should I eat? Foods high in calcium  Yogurt. Yogurt with fruit. Milk. Evaporated skim milk. Dry milk powder. Calcium-fortified orange juice. Parmesan cheese. Part-skim ricotta cheese. Natural hard cheese. Cream cheese. Cottage cheese. Canned sardines. Canned salmon. Calcium-treated tofu. Calcium-fortified cereal bar. Calcium-fortified cereal. Calcium-fortified graham crackers. Cooked collard greens. Turnip greens. Broccoli. Kale. Almonds. White beans. Corn tortilla.  Foods high in vitamin D Cod liver oil. Fatty fish, such as tuna, mackerel, and salmon. Milk. Fortified soy milk. Fortified fruit juice. Yogurt. Margarine. Egg yolks. Foods high in protein Beef. Lamb. Pork tenderloin. Chicken breast. Tuna (canned). Fish fillet. Tofu. Cooked soy beans. Soy patty. Beans (canned or cooked). Cottage cheese. Yogurt. Peanut butter. Pumpkin seeds. Nuts. Sunflower seeds. Hard cheese. Milk or other milk products, such as soy milk. The items listed above may not be a complete list of foods and beverages you can eat. Contact a dietitian for more options. Summary Calcium and vitamin D are nutrients that play an important role in bone health and are an important part of your eating plan for osteoporosis. Eat balanced meals that contain protein foods, fruits and vegetables, and foods rich in calcium and vitamin D. Avoid foods that have more than 300 mg of sodium per  serving. Too much sodium can cause your body to lose calcium. Exercise is an important part of prevention and treatment of osteoporosis. Aim for at least 30 minutes a day, 5 days a week. This information is not intended to replace advice given to you by your health care provider. Make sure you discuss any questions you have with your healthcare provider. Document Revised: 05/14/2020 Document Reviewed: 05/14/2020 Elsevier Patient Education  2022 [place]. Bone Density Test A bone density test uses a type of X-ray to measure the amount of calcium and other minerals in a person's bones. It can measure bone density in the hip and the spine. The test is similar to having a regular X-ray. This test may also be called: Bone densitometry. Bone mineral density test. Dual-energy X-ray absorptiometry (DEXA). You may have this test to: Diagnose a condition that causes weak or thin bones (osteoporosis). Screen you for osteoporosis. Predict your risk for a broken bone (fracture). Determine how well your osteoporosis treatment is working. Tell a health care provider about: Any allergies  you have. All medicines you are taking, including vitamins, herbs, eye drops, creams, and over-the-counter medicines. Any problems you or family members have had with anesthetic medicines. Any blood disorders you have. Any surgeries you have had. Any medical conditions you have. Whether you are pregnant or may be pregnant. Any medical tests you have had within the past 14 days that used contrast material. What are the risks? Generally, this is a safe test. However, it does expose you to a small amountof radiation, which can slightly increase your cancer risk. What happens before the test? Do not take any calcium supplements within the 24 hours before your test. You will need to remove all metal jewelry, eyeglasses, removable dental appliances, and any other metal objects on your body. What happens during the  test?  You will lie down on an exam table. There will be an X-ray generator below you and an imaging device above you. Other devices, such as boxes or braces, may be used to position your body properly for the scan. The machine will slowly scan your body. You will need to keep very still while the machine does the scan. The images will show up on a screen in the room. Images will be examined by a specialist after your test is finished. The procedure may vary among health care providers and hospitals. What can I expect after the test? It is up to you to get the results of your test. Ask your health care provider,or the department that is doing the test, when your results will be ready. Summary A bone density test is an imaging test that uses a type of X-ray to measure the amount of calcium and other minerals in your bones. The test may be used to diagnose or screen you for a condition that causes weak or thin bones (osteoporosis), predict your risk for a broken bone (fracture), or determine how well your osteoporosis treatment is working. Do not take any calcium supplements within 24 hours before your test. Ask your health care provider, or the department that is doing the test, when your results will be ready. This information is not intended to replace advice given to you by your health care provider. Make sure you discuss any questions you have with your healthcare provider. Document Revised: 05/14/2020 Document Reviewed: 05/14/2020 Elsevier Patient Education  2022 [place] Prevention in the Home, Adult Falls can cause injuries and can happen to people of all ages. There are many things you can do to make your home safe and to help prevent falls. Ask forhelp when making these changes. What actions can I take to prevent falls? General Instructions Use good lighting in all rooms. Replace any light bulbs that burn out. Turn on the lights in dark areas. Use night-lights. Keep items  that you use often in easy-to-reach places. Lower the shelves around your home if needed. Set up your furniture so you have a clear path. Avoid moving your furniture around. Do not have throw rugs or other things on the floor that can make you trip. Avoid walking on wet floors. If any of your floors are uneven, fix them. Add color or contrast paint or tape to clearly mark and help you see: Grab bars or handrails. First and last steps of staircases. Where the edge of each step is. If you use a stepladder: Make sure that it is fully opened. Do not climb a closed stepladder. Make sure the sides of the stepladder are locked in place. Ask  someone to hold the stepladder while you use it. Know where your pets are when moving through your home. What can I do in the bathroom?     Keep the floor dry. Clean up any water on the floor right away. Remove soap buildup in the tub or shower. Use nonskid mats or decals on the floor of the tub or shower. Attach bath mats securely with double-sided, nonslip rug tape. If you need to sit down in the shower, use a plastic, nonslip stool. Install grab bars by the toilet and in the tub and shower. Do not use towel bars as grab bars. What can I do in the bedroom? Make sure that you have a light by your bed that is easy to reach. Do not use any sheets or blankets for your bed that hang to the floor. Have a firm chair with side arms that you can use for support when you get dressed. What can I do in the kitchen? Clean up any spills right away. If you need to reach something above you, use a step stool with a grab bar. Keep electrical cords out of the way. Do not use floor polish or wax that makes floors slippery. What can I do with my stairs? Do not leave any items on the stairs. Make sure that you have a light switch at the top and the bottom of the stairs. Make sure that there are handrails on both sides of the stairs. Fix handrails that are broken or  loose. Install nonslip stair treads on all your stairs. Avoid having throw rugs at the top or bottom of the stairs. Choose a carpet that does not hide the edge of the steps on the stairs. Check carpeting to make sure that it is firmly attached to the stairs. Fix carpet that is loose or worn. What can I do on the outside of my home? Use bright outdoor lighting. Fix the edges of walkways and driveways and fix any cracks. Remove anything that might make you trip as you walk through a door, such as a raised step or threshold. Trim any bushes or trees on paths to your home. Check to see if handrails are loose or broken and that both sides of all steps have handrails. Install guardrails along the edges of any raised decks and porches. Clear paths of anything that can make you trip, such as tools or rocks. Have leaves, snow, or ice cleared regularly. Use sand or salt on paths during winter. Clean up any spills in your garage right away. This includes grease or oil spills. What other actions can I take? Wear shoes that: Have a low heel. Do not wear high heels. Have rubber bottoms. Feel good on your feet and fit well. Are closed at the toe. Do not wear open-toe sandals. Use tools that help you move around if needed. These include: Canes. Walkers. Scooters. Crutches. Review your medicines with your doctor. Some medicines can make you feel dizzy. This can increase your chance of falling. Ask your doctor what else you can do to help prevent falls. Where to find more information Centers for Disease Control and Prevention, [place]: http://www.wolf.info/ National Institute on Aging: http://kim-miller.com/ Contact a doctor if: You are afraid of falling at home. You feel weak, drowsy, or dizzy at home. You fall at home. Summary There are many simple things that you can do to make your home safe and to help prevent falls. Ways to make your home safe include removing things that  can make you trip and installing  grab bars in the bathroom. Ask for help when making these changes in your home. This information is not intended to replace advice given to you by your health care provider. Make sure you discuss any questions you have with your healthcare provider. Document Revised: 07/01/2020 Document Reviewed: 07/01/2020 Elsevier Patient Education  [place].

## 2021-06-03 DIAGNOSIS — N83202 Unspecified ovarian cyst, left side: Secondary | ICD-10-CM | POA: Diagnosis not present

## 2021-06-03 LAB — COMPREHENSIVE METABOLIC PANEL WITH GFR
ALT: 15 [IU]/L (ref 0–32)
AST: 16 [IU]/L (ref 0–40)
Albumin/Globulin Ratio: 2 (ref 1.2–2.2)
Albumin: 4.5 g/dL (ref 3.6–4.6)
Alkaline Phosphatase: 76 [IU]/L (ref 44–121)
BUN/Creatinine Ratio: 25 (ref 12–28)
BUN: 18 mg/dL (ref 8–27)
Bilirubin Total: 0.4 mg/dL (ref 0.0–1.2)
CO2: 24 mmol/L (ref 20–29)
Calcium: 9.8 mg/dL (ref 8.7–10.3)
Chloride: 100 mmol/L (ref 96–106)
Creatinine, Ser: 0.73 mg/dL (ref 0.57–1.00)
Globulin, Total: 2.3 g/dL (ref 1.5–4.5)
Glucose: 95 mg/dL (ref 65–99)
Potassium: 4.7 mmol/L (ref 3.5–5.2)
Sodium: 138 mmol/L (ref 134–144)
Total Protein: 6.8 g/dL (ref 6.0–8.5)
eGFR: 81 mL/min/{1.73_m2}

## 2021-06-03 LAB — CBC WITH DIFFERENTIAL/PLATELET
Basophils Absolute: 0 10*3/uL (ref 0.0–0.2)
Basos: 1 %
EOS (ABSOLUTE): 0.1 10*3/uL (ref 0.0–0.4)
Eos: 1 %
Hematocrit: 40.7 % (ref 34.0–46.6)
Hemoglobin: 13.6 g/dL (ref 11.1–15.9)
Immature Grans (Abs): 0 10*3/uL (ref 0.0–0.1)
Immature Granulocytes: 1 %
Lymphocytes Absolute: 1.3 10*3/uL (ref 0.7–3.1)
Lymphs: 21 %
MCH: 29.2 pg (ref 26.6–33.0)
MCHC: 33.4 g/dL (ref 31.5–35.7)
MCV: 87 fL (ref 79–97)
Monocytes Absolute: 0.5 10*3/uL (ref 0.1–0.9)
Monocytes: 8 %
Neutrophils Absolute: 4.4 10*3/uL (ref 1.4–7.0)
Neutrophils: 68 %
Platelets: 254 10*3/uL (ref 150–450)
RBC: 4.66 x10E6/uL (ref 3.77–5.28)
RDW: 13.1 % (ref 11.7–15.4)
WBC: 6.4 10*3/uL (ref 3.4–10.8)

## 2021-06-03 LAB — CARDIOVASCULAR RISK ASSESSMENT

## 2021-06-03 LAB — LIPID PANEL
Chol/HDL Ratio: 2.9 ratio (ref 0.0–4.4)
Cholesterol, Total: 170 mg/dL (ref 100–199)
HDL: 58 mg/dL
LDL Chol Calc (NIH): 88 mg/dL (ref 0–99)
Triglycerides: 140 mg/dL (ref 0–149)
VLDL Cholesterol Cal: 24 mg/dL (ref 5–40)

## 2021-06-03 LAB — VITAMIN D 25 HYDROXY (VIT D DEFICIENCY, FRACTURES): Vit D, 25-Hydroxy: 38.6 ng/mL (ref 30.0–100.0)

## 2021-06-04 ENCOUNTER — Telehealth: Payer: Self-pay

## 2021-06-04 NOTE — Telephone Encounter (Signed)
Spoke with Peter Congo to arrange phone visit with Dr. Denman George. She is scheduled for 06/08/21 at 1030. Patient is in agreement of date and time.

## 2021-06-08 ENCOUNTER — Encounter: Payer: Self-pay | Admitting: Gynecologic Oncology

## 2021-06-08 ENCOUNTER — Telehealth: Payer: Self-pay

## 2021-06-08 ENCOUNTER — Inpatient Hospital Stay: Payer: Medicare HMO | Attending: Gynecologic Oncology | Admitting: Gynecologic Oncology

## 2021-06-08 DIAGNOSIS — N83202 Unspecified ovarian cyst, left side: Secondary | ICD-10-CM | POA: Diagnosis not present

## 2021-06-08 NOTE — Progress Notes (Signed)
Gynecologic Oncology Telehealth Follow-up Visit Note: Gyn-Onc  I connected with Pamela Price on 06/08/21 at 10:30 AM EDT by telephone and verified that I am speaking with the correct person using two identifiers.  I discussed the limitations, risks, security and privacy concerns of performing an evaluation and management service by telemedicine and the availability of in-person appointments. I also discussed with the patient that there may be a patient responsible charge related to this service. The patient expressed understanding and agreed to proceed.  Other persons participating in the visit and their role in the encounter: none.  Patient's location: home Provider's location: [facility]  Chief Complaint:  Chief Complaint  Patient presents with   Ovarian Cyst    Assessment/Plan:  [name]  is a 87 y.o.  year old with a slowly growing, asymptomatic complex left adnexal cyst, and normal CA 125, incidentally identified on post-surgical CT scan in March, 2020. The cyst increased again slightly on repeat imaging in June, 2022 (compared to November, 2021). She had previously strongly desired to avoid surgery due to her responsibilities caring for her 85 year old legally blind husband and son with schizophrenia. However her husband died last month and she now has improved independence. Therefore I again offered surgery. This time she is in agreement with proceeding but would like to wait approximately 1 month.  I am recommending a robotic assisted hysterectomy, BSO, possible staging.  I explained this will be an outpatient surgery and discussed the anticipated risk and convalescence.  She will return to the clinic in approximately 1 month for a preoperative visit with [name], NP and then a surgery scheduled with me soon thereafter.   HPI: Pamela Price is a 87 year old P3 who was seen in consultation at the request of Dr. Modesta Messing for a complex left ovarian  cyst.  The patient's history began when she was noted to have a colonic mass identified on colonoscopy in the winter 2020.  This subsequently was followed with a laparoscopic hemicolectomy performed by Dr. Lilia Pro in [place] in February 12, 2019.  The patient reports the procedure was uncomplicated.  Subsequently malignancy, per patient stage I, was identified in the pathology specimen.  No further adjuvant therapy was recommended.  As part of work-up of her cancer diagnosis and subsequent staging a CT scan of the abdomen and pelvis was performed on February 22, 2019.  This revealed that she was status post right hemicolectomy.  The uterus appeared unremarkable.  A multiloculated cystic structure measuring 8 x 5 cm was noted in the left adnexal region.  There is no right adnexal abnormality noted.  No ascites no carcinomatosis, no lymphadenopathy was appreciated.  A transvaginal ultrasound scan was performed on March 29, 2019.  This confirmed a normal-appearing uterus measuring 6.5 x 4 x 2.6 cm with a 4 mm endometrial thickness.  The right ovary measured 1.4 cm and was grossly normal.  The left ovary measured 6.9 x 7.4 x 4.2 cm with a multicystic abnormality noted within.  The largest single cystic area measured 5.7 cm in diameter.  At least 1 septa appeared to be somewhat thickened.  There is no abnormal free fluid.  Ca1 25 was drawn on April 04, 2019 and was normal at 10.  Follow-up ultrasound scan was performed on May 30, 2019.  This showed a stable appearing left ovarian complex cyst measuring 7.4 x 4.4 x 4.9 cm that was multicystic and avascular.  No cul-de-sac free fluid was identified.  The  patient remained asymptomatic from this cyst and strongly desired not proceeding with surgery because her husband was legally blind and required her care as did her schizophrenic son. She did not feel that she could take time to recover from a surgery.  She reported a medical history significant for macular  degeneration for which she receives regular bevacizumab injections, She has a history of colon cancer at age 38.  Her past surgical history is significant for 2 prior cesarean sections, tubal ligation, hip replacement, and a hemicolectomy (right) in March 2020. The patient has had one prior vaginal delivery and 2 prior cesarean sections.  Repeat ultrasound performed in [facility] on January 15, 2020 revealed a uterus measuring 5.6 x 3.1 x 3.8 cm.  A 4 mm endometrial stripe.  Right ovary not visualized.  Left ovary measured 8.2 x 4.1 x 6.1 cm containing a complex cystic mass containing multiple septations of varying thickness with some slight nodularity.  Question of a small mural nodule.  Malignancy could not be excluded.  No definite blood flow within the septations or areas of nodularity on color Doppler. She continued to not desire surgical intervention unless absolutely necessary at that time.   Follow-up ultrasound scan on 11/09/2020 showed right ovary not visualized, uterus measuring 5.3 x 2 x 4 cm with a 3 mm endometrium.  The left ovary was replaced by a complicated cystic mass measuring 8.3 x 4.1 x 8.8 cm with multiple septations, which are thin and additional areas which are thicker and irregular.  There was several questionable areas of mural nodularity.  There was no internal blood flow on color Doppler imaging within the septations or within the areas of mural nodularity.  No other pelvic findings.  This represented a small growth in the cyst since the previous exam in February 2021.  Interval Hx:  Repeat US at [facility] on 06/03/21 showed a left adnexal mass now measuring 9 x 4 x 8.5cm containing multiple areas of septations and nodularity. Blood flow was detected in some of the septations and nodules. This represented a 7 mm growth  over months.  Her husband died in 05/01/21.    She continued to be symptom free.   Current Meds:  Outpatient Encounter Medications as of 06/08/2021   Medication Sig   calcium-vitamin D (OSCAL WITH D) 250-125 MG-UNIT tablet Take 1 tablet by mouth daily. Patient is unsure of mg's   lisinopril (ZESTRIL) 40 MG tablet Take 1 tablet (40 mg total) by mouth 2 (two) times daily.   pravastatin (PRAVACHOL) 40 MG tablet Take 1 tablet (40 mg total) by mouth at bedtime.   No facility-administered encounter medications on file as of 06/08/2021.    Allergy:  Allergies  Allergen Reactions   Sulfa Antibiotics Rash    Broke out in a rash 40 years ago  Broke out in a rash 40 years ago     Social Hx:   Social History   Socioeconomic History   Marital status: Married    Spouse name: Not on file   Number of children: Not on file   Years of education: Not on file   Highest education level: Not on file  Occupational History   Not on file  Tobacco Use   Smoking status: Former    Years: 5.00    Pack years: 0.00    Types: Cigarettes   Smokeless tobacco: Never   Tobacco comments:    quit 40 years ago   Substance and Sexual Activity  Alcohol use: Yes    Comment: occasionally    Drug use: No   Sexual activity: Not on file  Other Topics Concern   Not on file  Social History Narrative   Not on file   Social Determinants of Health   Financial Resource Strain: Not on file  Food Insecurity: Not on file  Transportation Needs: Not on file  Physical Activity: Not on file  Stress: Not on file  Social Connections: Not on file  Intimate Partner Violence: Not on file    Past Surgical Hx:  Past Surgical History:  Procedure Laterality Date   BREAST LUMPECTOMY W/ NEEDLE LOCALIZATION     axillary tail of the left  breast    COLON SURGERY  02/2019   for stage I colon cancer   TOTAL HIP ARTHROPLASTY Right 08/30/2017   Procedure: RIGHT TOTAL HIP ARTHROPLASTY ANTERIOR APPROACH;  Surgeon: Gaynelle Arabian, MD;  Location: [facility];  Service: Orthopedics;  Laterality: Right;    Past Medical Hx:  Past Medical History:  Diagnosis Date   Arthritis     History of right hemicolectomy    HLD (hyperlipidemia)    Hypertension    Macular degeneration    bilateral    Osteoporosis    Varicose veins of both lower extremities     Past Gynecological History:  See HPI No LMP recorded. Patient is postmenopausal.  Family Hx: No family history on file.  Review of Systems:  Constitutional  Feels well,   ENT Normal appearing ears and nares bilaterally Skin/Breast  No rash, sores, jaundice, itching, dryness Cardiovascular  No chest pain, shortness of breath, or edema  Pulmonary  No cough or wheeze.  Gastro Intestinal  No nausea, vomitting, or diarrhoea. No bright red blood per rectum, no abdominal pain, change in bowel movement, or constipation.  Genito Urinary  No frequency, urgency, dysuria, no bleeding or pelvic pain Musculo Skeletal  No myalgia, arthralgia, joint swelling or pain  Neurologic  No weakness, numbness, change in gait,  Psychology  No depression, anxiety, insomnia.   Physical Exam: Deferred due to telephone encounter. I discussed the assessment and treatment plan with the patient. The patient was provided with an opportunity to ask questions and all were answered. The patient agreed with the plan and demonstrated an understanding of the instructions.   The patient was advised to call back or see an in-person evaluation if the symptoms worsen or if the condition fails to improve as anticipated.   I provided 30 minutes of non face-to-face telephone visit time (because the webex visit failed due to technical issues) during this encounter, and > 50% was spent counseling as documented under my assessment & plan.   Thereasa Solo, MD  06/08/2021, 10:33 AM

## 2021-06-08 NOTE — Telephone Encounter (Signed)
Pamela Price was offered first available surgery date with Dr. Denman George of July 26-22.  She will take this day. Told her that she will hear from pre-surgical testing ~10 days prior to surgery for a pre-op appointment. Notified Melissa Cross,NP of the surgery chosen surgery date of 07-06-21.

## 2021-06-09 ENCOUNTER — Telehealth: Payer: Self-pay | Admitting: Family Medicine

## 2021-06-09 NOTE — Telephone Encounter (Signed)
   Pamela Price has been scheduled for the following appointment:  WHAT: [place] DATE: 07/14/21 TIME: 12:30 pm arrival time  Patient has been made aware.

## 2021-06-10 ENCOUNTER — Other Ambulatory Visit: Payer: Self-pay

## 2021-06-10 ENCOUNTER — Telehealth: Payer: Self-pay | Admitting: *Deleted

## 2021-06-10 NOTE — Telephone Encounter (Addendum)
Per Dr Denman George called and left the patient a message to call the office back. Patient needs to be scheduled for a pre op appt with Melissa APP before surgery

## 2021-06-16 ENCOUNTER — Telehealth: Payer: Self-pay

## 2021-06-16 NOTE — Telephone Encounter (Signed)
Spoke with Peter Congo about scheduling a pre-op appointment with [name], NP. She has been scheduled for Tuesday 06/29/21 at 1:30pm. Patient agreement of date and time.

## 2021-06-18 ENCOUNTER — Telehealth: Payer: Self-pay | Admitting: *Deleted

## 2021-06-18 NOTE — Telephone Encounter (Signed)
Returned the patient's call and patient  stated "I want to move forward with surgery on 8/3. I alao want to keep my pre op appt with Melissa APP." Explained that the pre op appt for pre admissions  May be moved

## 2021-06-29 ENCOUNTER — Inpatient Hospital Stay: Payer: Medicare HMO | Attending: Gynecologic Oncology | Admitting: Gynecologic Oncology

## 2021-06-29 ENCOUNTER — Other Ambulatory Visit: Payer: Self-pay

## 2021-06-29 VITALS — BP 164/66 | HR 70 | Temp 97.4°F | Resp 16 | Ht 62.0 in | Wt 106.0 lb

## 2021-06-29 DIAGNOSIS — N83202 Unspecified ovarian cyst, left side: Secondary | ICD-10-CM

## 2021-06-29 MED ORDER — SENNOSIDES-DOCUSATE SODIUM 8.6-50 MG PO TABS: 2.0000 | ORAL_TABLET | Freq: Every day | ORAL | 0 refills | Status: DC

## 2021-06-29 MED ORDER — TRAMADOL HCL 50 MG PO TABS: 50.0000 mg | ORAL_TABLET | Freq: Four times a day (QID) | ORAL | 0 refills | Status: DC | PRN

## 2021-06-29 NOTE — Patient Instructions (Signed)
Preparing for your Surgery  Plan for surgery on July 14, 2021 with Dr. [name] at [facility]. You will be scheduled for robotic assisted laparoscopic hysterectomy (removal of the uterus and cervix), bilateral salpingo-oophorectomy (removal of the ovaries and fallopian tubes), possible staging (if a cancer is identified).   Pre-operative Testing -You will receive a phone call from presurgical testing at [facility] to arrange for a pre-operative appointment and lab work.  -Bring your insurance card, copy of an advanced directive if applicable, medication list  -At that visit, you will be asked to sign a consent for a possible blood transfusion in case a transfusion becomes necessary during surgery.  The need for a blood transfusion is rare but having consent is a necessary part of your care.     -You should not be taking blood thinners or aspirin at least ten days prior to surgery unless instructed by your surgeon.  -Do not take supplements such as fish oil (omega 3), red yeast rice, turmeric before your surgery. You want to avoid medications with aspirin in them including headache powders such as BC or Goody's), Excedrin migraine.  Day Before Surgery at [place] will be asked to take in a light diet the day before surgery. You will be advised you can have clear liquids up until 3 hours before your surgery.    Eat a light diet the day before surgery.  Examples including soups, broths, toast, yogurt, mashed potatoes.  AVOID GAS PRODUCING FOODS. Things to avoid include carbonated beverages (fizzy beverages, sodas), raw fruits and raw vegetables (uncooked), or beans.   If your bowels are filled with gas, your surgeon will have difficulty visualizing your pelvic organs which increases your surgical risks.  Your role in recovery Your role is to become active as soon as directed by your doctor, while still giving yourself time to heal.  Rest  when you feel tired. You will be asked to do the following in order to speed your recovery:  - Cough and breathe deeply. This helps to clear and expand your lungs and can prevent pneumonia after surgery.  - [place]. Do mild physical activity. Walking or moving your legs help your circulation and body functions return to normal. Do not try to get up or walk alone the first time after surgery.   -If you develop swelling on one leg or the other, pain in the back of your leg, redness/warmth in one of your legs, please call the office or go to the Emergency Room to have a doppler to rule out a blood clot. For shortness of breath, chest pain-seek care in the Emergency Room as soon as possible. - Actively manage your pain. Managing your pain lets you move in comfort. We will ask you to rate your pain on a scale of zero to 10. It is your responsibility to tell your doctor or nurse where and how much you hurt so your pain can be treated.  Special Considerations -If you are diabetic, you may be placed on insulin after surgery to have closer control over your blood sugars to promote healing and recovery.  This does not mean that you will be discharged on insulin.  If applicable, your oral antidiabetics will be resumed when you are tolerating a solid diet.  -Your final pathology results from surgery should be available around one week after surgery and the results will be relayed to you when available.  -  FMLA forms can be faxed to [phone] and please allow 5-7 business days for completion.  Pain Management After Surgery -You have been prescribed your pain medication (tramadol also called ultram) and bowel regimen medications before surgery so that you can have these available when you are discharged from the hospital. The pain medication is for use ONLY AFTER surgery and a new prescription will not be given.   -Make sure that you have Tylenol and Ibuprofen (if you were cleared to take  this) at home to use on a regular basis after surgery for pain control. We recommend alternating the medications every hour to six hours since they work differently and are processed in the body differently for pain relief.  -Review the attached handout on narcotic use and their risks and side effects.   Bowel Regimen -You have been prescribed Sennakot-S to take nightly to prevent constipation especially if you are taking the narcotic pain medication intermittently.  It is important to prevent constipation and drink adequate amounts of liquids. You can stop taking this medication when you are not taking pain medication and you are back on your normal bowel routine.  Risks of Surgery Risks of surgery are low but include bleeding, infection, damage to surrounding structures, re-operation, blood clots, and very rarely death.   Blood Transfusion Information (For the consent to be signed before surgery)  We will be checking your blood type before surgery so in case of emergencies, we will know what type of blood you would need.                                            WHAT IS A BLOOD TRANSFUSION?  A transfusion is the replacement of blood or some of its parts. Blood is made up of multiple cells which provide different functions. Red blood cells carry oxygen and are used for blood loss replacement. White blood cells fight against infection. Platelets control bleeding. Plasma helps clot blood. Other blood products are available for specialized needs, such as hemophilia or other clotting disorders. BEFORE THE TRANSFUSION  Who gives blood for transfusions?  You may be able to donate blood to be used at a later date on yourself (autologous donation). Relatives can be asked to donate blood. This is generally not any safer than if you have received blood from a stranger. The same precautions are taken to ensure safety when a relative's blood is donated. Healthy volunteers who are fully evaluated to  make sure their blood is safe. This is blood bank blood. Transfusion therapy is the safest it has ever been in the practice of medicine. Before blood is taken from a donor, a complete history is taken to make sure that person has no history of diseases nor engages in risky social behavior (examples are intravenous drug use or sexual activity with multiple partners). The donor's travel history is screened to minimize risk of transmitting infections, such as malaria. The donated blood is tested for signs of infectious diseases, such as HIV and hepatitis. The blood is then tested to be sure it is compatible with you in order to minimize the chance of a transfusion reaction. If you or a relative donates blood, this is often done in anticipation of surgery and is not appropriate for emergency situations. It takes many days to process the donated blood. RISKS AND COMPLICATIONS Although transfusion therapy is very safe  and saves many lives, the main dangers of transfusion include:  Getting an infectious disease. Developing a transfusion reaction. This is an allergic reaction to something in the blood you were given. Every precaution is taken to prevent this. The decision to have a blood transfusion has been considered carefully by your caregiver before blood is given. Blood is not given unless the benefits outweigh the risks.  AFTER SURGERY INSTRUCTIONS  Return to work: 4 weeks if applicable  Activity: 1. Be up and out of the bed during the day.  Take a nap if needed.  You may walk up steps but be careful and use the hand rail.  Stair climbing will tire you more than you think, you may need to stop part way and rest.   2. No lifting or straining for 6 weeks over 10 pounds. No pushing, pulling, straining for 6 weeks.  3. No driving for around 1 week(s) if you were cleared to drive before surgery.  Do not drive if you are taking narcotic pain medicine and make sure that your reaction time has returned.    4. You can shower as soon as the next day after surgery. Shower daily.  Use your regular soap and water (not directly on the incision) and pat your incision(s) dry afterwards; don't rub.  No tub baths or submerging your body in water until cleared by your surgeon. If you have the soap that was given to you by pre-surgical testing that was used before surgery, you do not need to use it afterwards because this can irritate your incisions.   5. No sexual activity and nothing in the vagina for 8 weeks.  6. You may experience a small amount of clear drainage from your incisions, which is normal.  If the drainage persists, increases, or changes color please call the office.  7. Do not use creams, lotions, or ointments such as neosporin on your incisions after surgery until advised by your surgeon because they can cause removal of the dermabond glue on your incisions.    8. You may experience vaginal spotting after surgery or around the 6-8 week mark from surgery when the stitches at the top of the vagina begin to dissolve.  The spotting is normal but if you experience heavy bleeding, call our office.  9. Take Tylenol or ibuprofen first for pain and only use narcotic pain medication for severe pain not relieved by the Tylenol or Ibuprofen.  Monitor your Tylenol intake to a max of 4,000 mg in a 24 hour period. You can alternate these medications after surgery.  Diet: 1. Low sodium Heart Healthy Diet is recommended but you are cleared to resume your normal (before surgery) diet after your procedure.  2. It is safe to use a laxative, such as Miralax or Colace, if you have difficulty moving your bowels. You have been prescribed Sennakot at bedtime every evening to keep bowel movements regular and to prevent constipation.    Wound Care: 1. Keep clean and dry.  Shower daily.  Reasons to call the Doctor: Fever - Oral temperature greater than 100.4 degrees Fahrenheit Foul-smelling vaginal  discharge Difficulty urinating Nausea and vomiting Increased pain at the site of the incision that is unrelieved with pain medicine. Difficulty breathing with or without chest pain New calf pain especially if only on one side Sudden, continuing increased vaginal bleeding with or without clots.   Contacts: For questions or concerns you should contact:  Dr. [name] at [place],  NP at [phone]  After Hours: call [phone] and have the GYN Oncologist paged/contacted (after 5 pm or on the weekends).  Messages sent via mychart are for non-urgent matters and are not responded to after hours so for urgent needs, please call the after hours number.

## 2021-06-29 NOTE — Addendum Note (Signed)
Addended by: [name] D on: 06/29/2021 02:37 PM   Modules accepted: Orders

## 2021-06-29 NOTE — Addendum Note (Signed)
Addended by: [name] D on: 06/29/2021 03:37 PM   Modules accepted: Orders

## 2021-06-29 NOTE — Progress Notes (Signed)
Patient here for a pre-operative appointment prior to her scheduled surgery on July 14, 2021. She is scheduled for robotic assisted hysterectomy, BSO, possible staging. The surgery was discussed in detail.  See after visit summary for additional details. Visual aids used to discuss items related to surgery including sequential compression stockings, foley catheter, IV pump, multi-modal pain regimen including tylenol, photo of the surgical robot, female reproductive system to discuss surgery in detail.      Discussed post-op pain management in detail including the aspects of the enhanced recovery pathway.  Advised her that a new prescription would be sent in for tramadol and it is only to be used for after her upcoming surgery.  We discussed the use of tylenol post-op and to monitor for a maximum of 4,000 mg in a 24 hour period.  Also prescribed sennakot to be used after surgery and to hold if having loose stools.  Discussed bowel regimen in detail.  She states she takes Aleve at home if needed and will continue to use this after surgery.   Discussed the use of SCDs and measures to take at home to prevent DVT including frequent mobility.  Reportable signs and symptoms of DVT discussed. Post-operative instructions discussed and expectations for after surgery. Incisional care discussed as well including reportable signs and symptoms including erythema, drainage, wound separation.     10 minutes spent with the patient.  Verbalizing understanding of material discussed. No needs or concerns voiced at the end of the visit.   Advised patient to call for any needs.  Advised that her post-operative medications had been prescribed and could be picked up at any time. Repeat BP at the end of the visit is 164/66. She states her BP was normal at her PCP visit last week. Her husband passed away last month and she had to have her son involuntary committed so she has had high levels of stress at this time. On exam, her heart  rate is regular in rate and rhythm and her lungs are clear.  This appointment is included in the global surgical bundle as pre-operative teaching and has no charge.

## 2021-06-30 ENCOUNTER — Telehealth: Payer: Self-pay

## 2021-06-30 ENCOUNTER — Other Ambulatory Visit: Payer: Self-pay

## 2021-06-30 ENCOUNTER — Encounter (HOSPITAL_BASED_OUTPATIENT_CLINIC_OR_DEPARTMENT_OTHER): Payer: Self-pay | Admitting: Gynecologic Oncology

## 2021-06-30 ENCOUNTER — Encounter (HOSPITAL_COMMUNITY): Payer: Medicare HMO

## 2021-06-30 DIAGNOSIS — I10 Essential (primary) hypertension: Secondary | ICD-10-CM

## 2021-06-30 DIAGNOSIS — I8393 Asymptomatic varicose veins of bilateral lower extremities: Secondary | ICD-10-CM

## 2021-06-30 DIAGNOSIS — E785 Hyperlipidemia, unspecified: Secondary | ICD-10-CM

## 2021-06-30 DIAGNOSIS — H3530 Unspecified macular degeneration: Secondary | ICD-10-CM

## 2021-06-30 DIAGNOSIS — M810 Age-related osteoporosis without current pathological fracture: Secondary | ICD-10-CM

## 2021-06-30 DIAGNOSIS — N9489 Other specified conditions associated with female genital organs and menstrual cycle: Secondary | ICD-10-CM

## 2021-06-30 HISTORY — DX: Other specified conditions associated with female genital organs and menstrual cycle: N94.89

## 2021-06-30 HISTORY — DX: Age-related osteoporosis without current pathological fracture: M81.0

## 2021-06-30 HISTORY — DX: Unspecified macular degeneration: H35.30

## 2021-06-30 HISTORY — DX: Essential (primary) hypertension: I10

## 2021-06-30 HISTORY — DX: Asymptomatic varicose veins of bilateral lower extremities: I83.93

## 2021-06-30 HISTORY — DX: Hyperlipidemia, unspecified: E78.5

## 2021-06-30 NOTE — Progress Notes (Signed)
Spoke w/ via phone for pre-op interview---pt Lab needs dos----     none          Lab results------has lab appt 07-14-2021  COVID test -----patient states asymptomatic no test needed Arrive at -------1240 pm  Light diet day before surgery, NPO after MN NO Solid Food.  Clear liquids from MN until--- Med rec completed Medications to take morning of surgery -----none Diabetic medication -----n/a Patient instructed no nail polish to be worn day of surgery Patient instructed to bring photo id and insurance card day of surgery Patient aware to have Driver (ride ) / caregiver   son mike will drop pt off  for 24 hours after surgery  Patient Special Instructions -----none Pre-Op special Istructions -----none Patient verbalized understanding of instructions that were given at this phone interview. Patient denies shortness of breath, chest pain, fever, cough at this phone interview.

## 2021-06-30 NOTE — Progress Notes (Addendum)
Your procedure is scheduled on 07-14-2021  Report to [facility] pm   Call this number if you have problems the morning of surgery  :[phone].   OUR ADDRESS IS [address].  WE ARE LOCATED IN [facility]  PLEASE BRING YOUR INSURANCE CARD AND PHOTO ID DAY OF SURGERY.  ONLY ONE PERSON ALLOWED IN FACILITY WAITING AREA.                                     REMEMBER: Eat a light diet the day before surgery.  Examples including soups, broths, toast, yogurt, mashed potatoes.  Things to avoid include carbonated beverages (fizzy beverages), raw fruits and raw vegetables, or beans.   If your bowels are filled with gas, your surgeon will have difficulty visualizing your pelvic organs which increases your surgical risks.    DO NOT EAT FOOD, CANDY GUM OR MINTS  AFTER MIDNIGHT . YOU MAY HAVE CLEAR LIQUIDS FROM MIDNIGHT UNTIL 1140 AM.. NO CLEAR LIQUIDS AFTER  1140 AM DAY OF SURGERY.   YOU MAY  BRUSH YOUR TEETH MORNING OF SURGERY AND RINSE YOUR MOUTH OUT, NO CHEWING GUM CANDY OR MINTS.    CLEAR LIQUID DIET   Foods Allowed                                                                     Foods Excluded  Coffee and tea, regular and decaf                             liquids that you cannot  Plain Jell-O any favor except red or purple                                           see through such as: Fruit ices (not with fruit pulp)                                     milk, soups, orange juice  Iced Popsicles                       Cranberry, grape and apple juices Sports drinks like Gatorade Lightly seasoned clear broth or consume(fat free) Sugar, honey syrup  Sample Menu Breakfast                                Lunch                                     Supper Cranberry juice                    Beef broth  Chicken broth Jell-O                                     Grape juice                           Apple  juice Coffee or tea                        Jell-O                                      Popsicle                                                Coffee or tea                        Coffee or tea  _____________________________________________________________________     TAKE THESE MEDICATIONS MORNING OF SURGERY WITH A SIP OF WATER: NONE ( DO NO TAKE YOUR LISINOPRIL BLOOD PRESSURE MEDICATION MORNING OF SURGERY).  ONE VISITOR IS ALLOWED IN WAITING ROOM ONLY DAY OF SURGERY.  NO VISITOR MAY SPEND THE NIGHT.  VISITOR ARE ALLOWED TO STAY UNTIL 800 PM.                                    DO NOT WEAR JEWERLY, MAKE UP. DO NOT WEAR LOTIONS, POWDERS, PERFUMES OR NAIL POLISH. DO NOT SHAVE FOR 48 HOURS PRIOR TO DAY OF SURGERY. MEN MAY SHAVE FACE AND NECK. CONTACTS, GLASSES, OR DENTURES MAY NOT BE WORN TO SURGERY.                                    [facility] IS NOT RESPONSIBLE  FOR ANY BELONGINGS.                                                                    Marland Kitchen           [facility] - Preparing for Surgery Before surgery, you can play an important role.  Because skin is not sterile, your skin needs to be as free of germs as possible.  You can reduce the number of germs on your skin by washing with CHG (chlorahexidine gluconate) soap before surgery.  CHG is an antiseptic cleaner which kills germs and bonds with the skin to continue killing germs even after washing. Please DO NOT use if you have an allergy to CHG or antibacterial soaps.  If your skin becomes reddened/irritated stop using the CHG and inform your nurse when you arrive at Short Stay. Do not shave (including legs and underarms) for at least 48 hours prior to the first CHG shower.  You may shave your face/neck. Please follow  these instructions carefully:  1.  Shower with CHG Soap the night before surgery and the  morning of Surgery.  2.  If you choose to wash your hair, wash your hair first as usual with your  normal  shampoo.  3.  After you  shampoo, rinse your hair and body thoroughly to remove the  shampoo.                            4.  Use CHG as you would any other liquid soap.  You can apply chg directly  to the skin and wash                      Gently with a scrungie or clean washcloth.  5.  Apply the CHG Soap to your body ONLY FROM THE NECK DOWN.   Do not use on face/ open                           Wound or open sores. Avoid contact with eyes, ears mouth and genitals (private parts).                       Wash face,  Genitals (private parts) with your normal soap.             6.  Wash thoroughly, paying special attention to the area where your surgery  will be performed.  7.  Thoroughly rinse your body with warm water from the neck down.  8.  DO NOT shower/wash with your normal soap after using and rinsing off  the CHG Soap.                9.  Pat yourself dry with a clean towel.            10.  Wear clean pajamas.            11.  Place clean sheets on your bed the night of your first shower and do not  sleep with pets. Day of Surgery : Do not apply any lotions/deodorants the morning of surgery.  Please wear clean clothes to the hospital/surgery center.  FAILURE TO FOLLOW THESE INSTRUCTIONS MAY RESULT IN THE CANCELLATION OF YOUR SURGERY PATIENT SIGNATURE_________________________________  NURSE SIGNATURE__________________________________  ________________________________________________________________________                                                        QUESTIONS Pamela Price PRE OP NURSE PHONE [phone].

## 2021-06-30 NOTE — Telephone Encounter (Signed)
Received phone call from [place] regarding her upcoming surgery on 07/14/21. She was under the impression that she was having something removed off her ovary and not a hysterectomy. She is not opposed to the hysterectomy but would like some clarification prior to the day of her surgery. Informed her that our office will be in touch with her. Patient verbalized understanding.

## 2021-07-01 DIAGNOSIS — H353221 Exudative age-related macular degeneration, left eye, with active choroidal neovascularization: Secondary | ICD-10-CM | POA: Diagnosis not present

## 2021-07-01 DIAGNOSIS — H353211 Exudative age-related macular degeneration, right eye, with active choroidal neovascularization: Secondary | ICD-10-CM | POA: Diagnosis not present

## 2021-07-02 ENCOUNTER — Telehealth: Payer: Self-pay | Admitting: Gynecologic Oncology

## 2021-07-02 NOTE — Telephone Encounter (Signed)
Returned call to patient. She would like to have both ovaries including the ovary with the complex cyst removed during surgery. She does not want to proceed with a hysterectomy unless a cancer is identified. All questions answered. Advised patient that her consent and surgery information would be updated. Advised to call for any needs or concerns.

## 2021-07-12 ENCOUNTER — Encounter (HOSPITAL_COMMUNITY)
Admission: RE | Admit: 2021-07-12 | Discharge: 2021-07-12 | Disposition: A | Discharge status: Home / Self Care | Payer: Medicare HMO | Source: Ambulatory Visit | Attending: Gynecologic Oncology | Admitting: Gynecologic Oncology

## 2021-07-12 ENCOUNTER — Other Ambulatory Visit: Payer: Self-pay

## 2021-07-12 DIAGNOSIS — Z01818 Encounter for other preprocedural examination: Secondary | ICD-10-CM | POA: Insufficient documentation

## 2021-07-12 LAB — URINALYSIS, ROUTINE W REFLEX MICROSCOPIC
Bacteria, UA: NONE SEEN
Bilirubin Urine: NEGATIVE
Glucose, UA: NEGATIVE mg/dL
Hgb urine dipstick: NEGATIVE
Ketones, ur: NEGATIVE mg/dL
Leukocytes,Ua: NEGATIVE
Nitrite: NEGATIVE
Protein, ur: NEGATIVE mg/dL
Specific Gravity, Urine: 1.01 (ref 1.005–1.030)
pH: 7 (ref 5.0–8.0)

## 2021-07-12 LAB — COMPREHENSIVE METABOLIC PANEL WITH GFR
ALT: 20 U/L (ref 0–44)
AST: 20 U/L (ref 15–41)
Albumin: 4.3 g/dL (ref 3.5–5.0)
Alkaline Phosphatase: 72 U/L (ref 38–126)
Anion gap: 8 (ref 5–15)
BUN: 19 mg/dL (ref 8–23)
CO2: 28 mmol/L (ref 22–32)
Calcium: 9.6 mg/dL (ref 8.9–10.3)
Chloride: 103 mmol/L (ref 98–111)
Creatinine, Ser: 0.63 mg/dL (ref 0.44–1.00)
GFR, Estimated: >60 mL/min
Glucose, Bld: 90 mg/dL (ref 70–99)
Potassium: 4.5 mmol/L (ref 3.5–5.1)
Sodium: 139 mmol/L (ref 135–145)
Total Bilirubin: 0.7 mg/dL (ref 0.3–1.2)
Total Protein: 7.4 g/dL (ref 6.5–8.1)

## 2021-07-12 LAB — CBC
HCT: 41.9 % (ref 36.0–46.0)
Hemoglobin: 13.6 g/dL (ref 12.0–15.0)
MCH: 29.4 pg (ref 26.0–34.0)
MCHC: 32.5 g/dL (ref 30.0–36.0)
MCV: 90.7 fL (ref 80.0–100.0)
Platelets: 218 10*3/uL (ref 150–400)
RBC: 4.62 MIL/uL (ref 3.87–5.11)
RDW: 13.6 % (ref 11.5–15.5)
WBC: 6.3 10*3/uL (ref 4.0–10.5)
nRBC: 0 % (ref 0.0–0.2)

## 2021-07-13 ENCOUNTER — Telehealth: Payer: Self-pay

## 2021-07-13 NOTE — Telephone Encounter (Signed)
Reviewed written pre-op instructions from 06-30-21 with Ms Kondo. Pt verbalizes understanding.

## 2021-07-14 ENCOUNTER — Other Ambulatory Visit: Payer: Self-pay

## 2021-07-14 ENCOUNTER — Encounter (HOSPITAL_BASED_OUTPATIENT_CLINIC_OR_DEPARTMENT_OTHER): Payer: Self-pay | Admitting: Gynecologic Oncology

## 2021-07-14 ENCOUNTER — Encounter (HOSPITAL_BASED_OUTPATIENT_CLINIC_OR_DEPARTMENT_OTHER)
Admission: RE | Disposition: A | Discharge status: Home / Self Care | Payer: Self-pay | Source: Home / Self Care | Attending: Gynecologic Oncology

## 2021-07-14 ENCOUNTER — Ambulatory Visit (HOSPITAL_BASED_OUTPATIENT_CLINIC_OR_DEPARTMENT_OTHER): Payer: Medicare HMO | Admitting: Anesthesiology

## 2021-07-14 ENCOUNTER — Ambulatory Visit (HOSPITAL_COMMUNITY)
Admission: RE | Admit: 2021-07-14 | Discharge: 2021-07-15 | Disposition: A | Discharge status: Home / Self Care | Payer: Medicare HMO | Attending: Gynecologic Oncology | Admitting: Gynecologic Oncology

## 2021-07-14 DIAGNOSIS — D259 Leiomyoma of uterus, unspecified: Secondary | ICD-10-CM | POA: Diagnosis not present

## 2021-07-14 DIAGNOSIS — N838 Other noninflammatory disorders of ovary, fallopian tube and broad ligament: Secondary | ICD-10-CM | POA: Diagnosis not present

## 2021-07-14 DIAGNOSIS — N888 Other specified noninflammatory disorders of cervix uteri: Secondary | ICD-10-CM | POA: Insufficient documentation

## 2021-07-14 DIAGNOSIS — E785 Hyperlipidemia, unspecified: Secondary | ICD-10-CM | POA: Diagnosis not present

## 2021-07-14 DIAGNOSIS — Z79899 Other long term (current) drug therapy: Secondary | ICD-10-CM | POA: Diagnosis not present

## 2021-07-14 DIAGNOSIS — Z85038 Personal history of other malignant neoplasm of large intestine: Secondary | ICD-10-CM | POA: Diagnosis not present

## 2021-07-14 DIAGNOSIS — Z9049 Acquired absence of other specified parts of digestive tract: Secondary | ICD-10-CM | POA: Insufficient documentation

## 2021-07-14 DIAGNOSIS — N9489 Other specified conditions associated with female genital organs and menstrual cycle: Secondary | ICD-10-CM | POA: Diagnosis not present

## 2021-07-14 DIAGNOSIS — N809 Endometriosis, unspecified: Secondary | ICD-10-CM | POA: Diagnosis not present

## 2021-07-14 DIAGNOSIS — D271 Benign neoplasm of left ovary: Secondary | ICD-10-CM | POA: Diagnosis present

## 2021-07-14 DIAGNOSIS — Z882 Allergy status to sulfonamides status: Secondary | ICD-10-CM | POA: Diagnosis not present

## 2021-07-14 DIAGNOSIS — N736 Female pelvic peritoneal adhesions (postinfective): Secondary | ICD-10-CM | POA: Diagnosis not present

## 2021-07-14 DIAGNOSIS — N800 Endometriosis of uterus: Secondary | ICD-10-CM | POA: Insufficient documentation

## 2021-07-14 DIAGNOSIS — I10 Essential (primary) hypertension: Secondary | ICD-10-CM | POA: Diagnosis not present

## 2021-07-14 DIAGNOSIS — Z96641 Presence of right artificial hip joint: Secondary | ICD-10-CM | POA: Diagnosis not present

## 2021-07-14 DIAGNOSIS — Z87891 Personal history of nicotine dependence: Secondary | ICD-10-CM | POA: Diagnosis not present

## 2021-07-14 DIAGNOSIS — N83202 Unspecified ovarian cyst, left side: Secondary | ICD-10-CM | POA: Diagnosis present

## 2021-07-14 HISTORY — PX: ROBOTIC ASSISTED BILATERAL SALPINGO OOPHERECTOMY: SHX6078

## 2021-07-14 HISTORY — PX: ROBOTIC ASSISTED TOTAL HYSTERECTOMY WITH BILATERAL SALPINGO OOPHERECTOMY: SHX6086

## 2021-07-14 LAB — TYPE AND SCREEN
ABO/RH(D): A POS
Antibody Screen: NEGATIVE

## 2021-07-14 SURGERY — XI ROBOTIC ASSISTED BILATERAL SALPINGO OOPHORECTOMY
Anesthesia: General | Site: Abdomen

## 2021-07-14 MED ORDER — DEXAMETHASONE SODIUM PHOSPHATE 10 MG/ML IJ SOLN: 4.0000 mg | INTRAMUSCULAR | Status: DC

## 2021-07-14 MED ORDER — ONDANSETRON HCL 4 MG/2ML IJ SOLN
INTRAMUSCULAR | Status: DC | PRN
  Administered 2021-07-14: 4 mg via INTRAVENOUS

## 2021-07-14 MED ORDER — SODIUM CHLORIDE 0.9 % IR SOLN
Status: DC | PRN
  Administered 2021-07-14: 300 mL

## 2021-07-14 MED ORDER — OXYCODONE HCL 5 MG PO TABS
ORAL_TABLET | ORAL | Status: AC
  Filled 2021-07-14: qty 2

## 2021-07-14 MED ORDER — DEXAMETHASONE SODIUM PHOSPHATE 10 MG/ML IJ SOLN
INTRAMUSCULAR | Status: DC | PRN
  Administered 2021-07-14: 4 mg via INTRAVENOUS

## 2021-07-14 MED ORDER — DEXAMETHASONE SODIUM PHOSPHATE 10 MG/ML IJ SOLN
INTRAMUSCULAR | Status: AC
  Filled 2021-07-14: qty 1

## 2021-07-14 MED ORDER — FENTANYL CITRATE (PF) 100 MCG/2ML IJ SOLN
25.0000 ug | INTRAMUSCULAR | Status: AC | PRN
  Administered 2021-07-14 (×6): 25 ug via INTRAVENOUS

## 2021-07-14 MED ORDER — BUPIVACAINE HCL 0.25 % IJ SOLN
INTRAMUSCULAR | Status: DC | PRN
  Administered 2021-07-14: 10 mL

## 2021-07-14 MED ORDER — ACETAMINOPHEN 500 MG PO TABS
ORAL_TABLET | ORAL | Status: AC
  Filled 2021-07-14: qty 2

## 2021-07-14 MED ORDER — LIDOCAINE HCL (PF) 2 % IJ SOLN
INTRAMUSCULAR | Status: AC
  Filled 2021-07-14: qty 5

## 2021-07-14 MED ORDER — LIDOCAINE 2% (20 MG/ML) 5 ML SYRINGE
INTRAMUSCULAR | Status: DC | PRN
  Administered 2021-07-14: 50 mg via INTRAVENOUS

## 2021-07-14 MED ORDER — OXYCODONE HCL 5 MG/5ML PO SOLN: 5.0000 mg | Freq: Once | ORAL | Status: DC | PRN

## 2021-07-14 MED ORDER — FENTANYL CITRATE (PF) 100 MCG/2ML IJ SOLN
INTRAMUSCULAR | Status: AC
  Filled 2021-07-14: qty 2

## 2021-07-14 MED ORDER — ONDANSETRON HCL 4 MG/2ML IJ SOLN
INTRAMUSCULAR | Status: AC
  Filled 2021-07-14: qty 2

## 2021-07-14 MED ORDER — ROCURONIUM BROMIDE 10 MG/ML (PF) SYRINGE
PREFILLED_SYRINGE | INTRAVENOUS | Status: DC | PRN
  Administered 2021-07-14: 15 mg via INTRAVENOUS
  Administered 2021-07-14: 40 mg via INTRAVENOUS

## 2021-07-14 MED ORDER — SODIUM CHLORIDE 0.9 % IV SOLN
2.0000 g | INTRAVENOUS | Status: AC
  Administered 2021-07-14: 2 g via INTRAVENOUS

## 2021-07-14 MED ORDER — KETOROLAC TROMETHAMINE 15 MG/ML IJ SOLN
15.0000 mg | Freq: Four times a day (QID) | INTRAMUSCULAR | Status: DC | PRN
  Administered 2021-07-14 – 2021-07-15 (×2): 15 mg via INTRAVENOUS

## 2021-07-14 MED ORDER — AMISULPRIDE (ANTIEMETIC) 5 MG/2ML IV SOLN
INTRAVENOUS | Status: AC
  Filled 2021-07-14: qty 4

## 2021-07-14 MED ORDER — ACETAMINOPHEN 500 MG PO TABS
1000.0000 mg | ORAL_TABLET | Freq: Once | ORAL | Status: AC
  Administered 2021-07-14: 1000 mg via ORAL

## 2021-07-14 MED ORDER — ROCURONIUM BROMIDE 10 MG/ML (PF) SYRINGE: PREFILLED_SYRINGE | INTRAVENOUS | Status: DC | PRN

## 2021-07-14 MED ORDER — ENOXAPARIN SODIUM 40 MG/0.4ML IJ SOSY
PREFILLED_SYRINGE | INTRAMUSCULAR | Status: AC
  Filled 2021-07-14: qty 0.4

## 2021-07-14 MED ORDER — FENTANYL CITRATE (PF) 100 MCG/2ML IJ SOLN
INTRAMUSCULAR | Status: DC | PRN
  Administered 2021-07-14: 25 ug via INTRAVENOUS
  Administered 2021-07-14: 50 ug via INTRAVENOUS
  Administered 2021-07-14: 25 ug via INTRAVENOUS

## 2021-07-14 MED ORDER — ONDANSETRON HCL 4 MG PO TABS: 4.0000 mg | ORAL_TABLET | Freq: Four times a day (QID) | ORAL | Status: DC | PRN

## 2021-07-14 MED ORDER — LACTATED RINGERS IV SOLN: INTRAVENOUS | Status: DC

## 2021-07-14 MED ORDER — PHENYLEPHRINE 40 MCG/ML (10ML) SYRINGE FOR IV PUSH (FOR BLOOD PRESSURE SUPPORT)
PREFILLED_SYRINGE | INTRAVENOUS | Status: AC
  Filled 2021-07-14: qty 10

## 2021-07-14 MED ORDER — KETOROLAC TROMETHAMINE 15 MG/ML IJ SOLN
INTRAMUSCULAR | Status: AC
  Filled 2021-07-14: qty 1

## 2021-07-14 MED ORDER — ACETAMINOPHEN 500 MG PO TABS: 1000.0000 mg | ORAL_TABLET | ORAL | Status: DC

## 2021-07-14 MED ORDER — ONDANSETRON HCL 4 MG/2ML IJ SOLN: 4.0000 mg | Freq: Once | INTRAMUSCULAR | Status: DC | PRN

## 2021-07-14 MED ORDER — SUGAMMADEX SODIUM 200 MG/2ML IV SOLN
INTRAVENOUS | Status: DC | PRN
  Administered 2021-07-14: 100 mg via INTRAVENOUS

## 2021-07-14 MED ORDER — ROCURONIUM BROMIDE 10 MG/ML (PF) SYRINGE
PREFILLED_SYRINGE | INTRAVENOUS | Status: AC
  Filled 2021-07-14: qty 10

## 2021-07-14 MED ORDER — TRAMADOL HCL 50 MG PO TABS
50.0000 mg | ORAL_TABLET | Freq: Four times a day (QID) | ORAL | Status: DC | PRN
  Administered 2021-07-15: 50 mg via ORAL

## 2021-07-14 MED ORDER — PHENYLEPHRINE 40 MCG/ML (10ML) SYRINGE FOR IV PUSH (FOR BLOOD PRESSURE SUPPORT)
PREFILLED_SYRINGE | INTRAVENOUS | Status: DC | PRN
  Administered 2021-07-14 (×2): 120 ug via INTRAVENOUS

## 2021-07-14 MED ORDER — SODIUM CHLORIDE 0.9 % IV SOLN
INTRAVENOUS | Status: AC
  Filled 2021-07-14: qty 2

## 2021-07-14 MED ORDER — ENOXAPARIN SODIUM 40 MG/0.4ML IJ SOSY
40.0000 mg | PREFILLED_SYRINGE | INTRAMUSCULAR | Status: AC
  Administered 2021-07-14: 40 mg via SUBCUTANEOUS

## 2021-07-14 MED ORDER — AMISULPRIDE (ANTIEMETIC) 5 MG/2ML IV SOLN
10.0000 mg | Freq: Once | INTRAVENOUS | Status: AC | PRN
  Administered 2021-07-14: 10 mg via INTRAVENOUS

## 2021-07-14 MED ORDER — PROPOFOL 10 MG/ML IV BOLUS
INTRAVENOUS | Status: DC | PRN
  Administered 2021-07-14: 30 mg via INTRAVENOUS
  Administered 2021-07-14: 80 mg via INTRAVENOUS

## 2021-07-14 MED ORDER — 0.9 % SODIUM CHLORIDE (POUR BTL) OPTIME
TOPICAL | Status: DC | PRN
  Administered 2021-07-14: 500 mL

## 2021-07-14 MED ORDER — PROPOFOL 10 MG/ML IV BOLUS
INTRAVENOUS | Status: AC
  Filled 2021-07-14: qty 20

## 2021-07-14 MED ORDER — ONDANSETRON HCL 4 MG/2ML IJ SOLN: 4.0000 mg | Freq: Four times a day (QID) | INTRAMUSCULAR | Status: DC | PRN

## 2021-07-14 MED ORDER — OXYCODONE HCL 5 MG PO TABS: 5.0000 mg | ORAL_TABLET | Freq: Once | ORAL | Status: DC | PRN

## 2021-07-14 MED ORDER — OXYCODONE HCL 5 MG PO TABS
5.0000 mg | ORAL_TABLET | ORAL | Status: DC | PRN
  Administered 2021-07-14 (×2): 5 mg via ORAL

## 2021-07-14 SURGICAL SUPPLY — 67 items
ADH SKN CLS APL DERMABOND .7 (GAUZE/BANDAGES/DRESSINGS) ×2
AGENT HMST KT MTR STRL THRMB (HEMOSTASIS)
APL ESCP 34 STRL LF DISP (HEMOSTASIS)
APPLICATOR SURGIFLO ENDO (HEMOSTASIS)
BAG RETRIEVAL 12/15 (BASKET) ×3
BAG SPEC RTRVL LRG 6X4 10 (ENDOMECHANICALS)
BLADE SURG 10 STRL SS (BLADE)
COVER BACK TABLE 60X90IN (DRAPES) ×3
COVER TIP SHEARS 8 DVNC (MISCELLANEOUS) ×2
COVER TIP SHEARS 8MM DA VINCI (MISCELLANEOUS) ×1
DECANTER SPIKE VIAL GLASS SM (MISCELLANEOUS)
DERMABOND ADVANCED (GAUZE/BANDAGES/DRESSINGS) ×1
DERMABOND ADVANCED .7 DNX12 (GAUZE/BANDAGES/DRESSINGS) ×2
DRAPE ARM DVNC X/XI (DISPOSABLE) ×8
DRAPE COLUMN DVNC XI (DISPOSABLE) ×2
DRAPE DA VINCI XI ARM (DISPOSABLE) ×4
DRAPE DA VINCI XI COLUMN (DISPOSABLE) ×1
DRAPE SHEET LG 3/4 BI-LAMINATE (DRAPES) ×3
DRAPE SURG IRRIG POUCH 19X23 (DRAPES) ×3
ELECT REM PT RETURN 9FT ADLT (ELECTROSURGICAL) ×3
GAUZE 4X4 16PLY ~~LOC~~+RFID DBL (SPONGE) ×9
GLOVE SURG ENC MOIS LTX SZ6 (GLOVE) ×12
GLOVE SURG ENC MOIS LTX SZ6.5 (GLOVE) ×6
HEMOSTAT SURGICEL 2X14 (HEMOSTASIS)
HEMOSTAT SURGICEL 4X8 (HEMOSTASIS)
HOLDER FOLEY CATH W/STRAP (MISCELLANEOUS) ×3
IRRIG SUCT STRYKERFLOW 2 WTIP (MISCELLANEOUS) ×3
KIT PROCEDURE DA VINCI SI (MISCELLANEOUS)
KIT PROCEDURE DVNC SI (MISCELLANEOUS)
KIT TURNOVER CYSTO (KITS) ×3
LEGGING LITHOTOMY PAIR STRL (DRAPES) ×3
MANIPULATOR UTERINE 4.5 ZUMI (MISCELLANEOUS) ×3
NEEDLE HYPO 22GX1.5 SAFETY (NEEDLE) ×3
NEEDLE SPNL 18GX3.5 QUINCKE PK (NEEDLE)
OBTURATOR OPTICAL STANDARD 8MM (TROCAR) ×1
OBTURATOR OPTICAL STND 8 DVNC (TROCAR) ×2
OCCLUDER COLPOPNEUMO (BALLOONS) ×3
PACK ROBOT GYN CUSTOM WL (TRAY / TRAY PROCEDURE) ×3
PACK ROBOTIC GOWN (GOWN DISPOSABLE) ×3
PAD POSITIONING PINK XL (MISCELLANEOUS) ×3
PENCIL SMOKE EVACUATOR (MISCELLANEOUS)
PORT ACCESS TROCAR AIRSEAL 12 (TROCAR) ×2
PORT ACCESS TROCAR AIRSEAL 5M (TROCAR) ×1
POUCH SPECIMEN RETRIEVAL 10MM (ENDOMECHANICALS)
SEAL CANN UNIV 5-8 DVNC XI (MISCELLANEOUS) ×8
SEAL XI 5MM-8MM UNIVERSAL (MISCELLANEOUS) ×4
SET TRI-LUMEN FLTR TB AIRSEAL (TUBING) ×3
SURGIFLO W/THROMBIN 8M KIT (HEMOSTASIS)
SUT MNCRL 0 1X36 CT-1 (SUTURE)
SUT MNCRL AB 4-0 PS2 18 (SUTURE)
SUT MON AB-0 CT1 36 (SUTURE)
SUT MONOCRYL 0 (SUTURE)
SUT VIC AB 0 CT1 36 (SUTURE)
SUT VIC AB 3-0 SH 27 (SUTURE) ×3
SUT VIC AB 3-0 SH 27X BRD (SUTURE) ×2
SUT VIC AB 4-0 PS2 18 (SUTURE) ×6
SUT VLOC 180 0 9IN  GS21 (SUTURE) ×1
SUT VLOC 180 0 9IN GS21 (SUTURE) ×2
SYR 10ML LL (SYRINGE)
TRAP SPECIMEN MUCUS 40CC (MISCELLANEOUS) ×3
TRAY FOL W/BAG SLVR 16FR STRL (SET/KITS/TRAYS/PACK) ×2
TRAY FOLEY W/BAG SLVR 16FR LF (SET/KITS/TRAYS/PACK) ×3
TROCAR BLADELESS OPT 12M 100M (ENDOMECHANICALS)
TUBE CONNECTING 12X1/4 (SUCTIONS) ×3
UNDERPAD 30X36 HEAVY ABSORB (UNDERPADS AND DIAPERS) ×3
WATER STERILE IRR 1000ML POUR (IV SOLUTION) ×3
YANKAUER SUCT BULB TIP NO VENT (SUCTIONS)

## 2021-07-14 NOTE — Progress Notes (Signed)
Call to Dr. Denman George to update her on patient request to stay overnight in [place]. Dr. Denman George updated and full admission orders requested at this time. Dr. Denman George states "I'm at home and will get to it later." No new orders at this time.

## 2021-07-14 NOTE — Progress Notes (Signed)
Dr Ermalene Postin at bedside to evaluate patient.  Crepitus is seen and felt all over her body.  Her eyes are swollen.  Dr Denman George was notified by CRNA.

## 2021-07-14 NOTE — Progress Notes (Signed)
Called Dr Denman George about patient and concerns of Dr Ermalene Postin.  Dr Denman George feels comfortable with patient going home with crepitus in her neck and shoulder but will leave it up to the patient.  Patient requests to stay overnight in [place].  Dr Denman George will be contacted to place admission orders.  Patient is stable at this time and sitting up in a recliner having a snack.

## 2021-07-14 NOTE — Op Note (Signed)
OPERATIVE NOTE 07/14/21  Surgeon: Donaciano Eva   Assistants: [name] (an NP assistant was necessary for tissue manipulation, management of robotic instrumentation, retraction and positioning due to the complexity of the case and hospital policies).   Anesthesia: General endotracheal anesthesia  ASA Class: 3   Pre-operative Diagnosis:  left ovarian cyst  Post-operative Diagnosis:  left ovarian benign cyst  Operation: Robotic-assisted laparoscopic total hysterectomy with bilateral salpingoophorectomy   Surgeon: Donaciano Eva  Assistant Surgeon: [name], NP  Anesthesia: GET  Urine Output: 50  Operative Findings:  : 6cm normal appearing uterus, normal right tube and ovary, normal upper abdomen. 8cm complex left adnexal cystic mass adherent with the sigmoid colon mesentery. Benign on frozen section.  Estimated Blood Loss:   25cc       Total IV Fluids: 500 ml         Specimens:  uterus with cervix and right tube, left tube and ovary, washings         Complications:  None; patient tolerated the procedure well.         Disposition: PACU - hemodynamically stable.  Procedure Details  The patient was seen in the Holding Room. The risks, benefits, complications, treatment options, and expected outcomes were discussed with the patient.  The patient concurred with the proposed plan, giving informed consent.  The site of surgery properly noted/marked. The patient was identified as Fredirick Lathe and the procedure verified as a Robotic-assisted hysterectomy with bilateral salpingo oophorectomy. A Time Out was held and the above information confirmed.  After induction of anesthesia, the patient was draped and prepped in the usual sterile manner. Pt was placed in supine position after anesthesia and draped and prepped in the usual sterile manner. The abdominal drape was placed after the CholoraPrep had been allowed to dry for 3 minutes.  Her arms were tucked to her  side with all appropriate precautions.  The shoulders were stabilized with padded shoulder blocks applied to the acromium processes.  The patient was placed in the semi-lithotomy position in [place].  The perineum was prepped with Betadine. The patient was then prepped. Foley catheter was placed.  A sterile speculum was placed in the vagina.  The cervix was grasped with a single-tooth tenaculum but could not be dilated with Kennon Rounds dilators.  This was due to cervical atrophy. A EEA sizer was used and placed in the vagina to delineate the cervicovaginal junction.  A pneum occluder balloon was placed over the manipulator.  OG tube placement was confirmed and to suction.   Next, a 5 mm skin incision was made 1 cm below the subcostal margin in the midclavicular line.  The 5 mm Optiview port and scope was used for direct entry.  Opening pressure was under 10 mm CO2.  The abdomen was insufflated and the findings were noted as above.   At this point and all points during the procedure, the patient's intra-abdominal pressure did not exceed 15 mmHg. Next, a 8 mm skin incision was made in the umbilicus and a right and left port was placed about 10 cm lateral to the robot port on the right and left side.  A fourth arm was placed in the left lower quadrant 2 cm above and superior and medial to the anterior superior iliac spine.  All ports were placed under direct visualization.  The patient was placed in steep Trendelenburg.  Bowel was folded away into the upper abdomen.  The robot was docked in the normal  manner.  The hysterectomy was started after the round ligament on the right side was incised and the retroperitoneum was entered and the pararectal space was developed.  The ureter was noted to be on the medial leaf of the broad ligament.  The peritoneum above the ureter was incised and stretched and the infundibulopelvic ligament was skeletonized, cauterized and cut.  The posterior peritoneum was taken down to the  level of the EEA sizer and the rectovaginal septum was sharply developed to drop the rectum.  The anterior peritoneum was also taken down.  The bladder flap was created to the level of the EEA.  The uterine artery on the right side was skeletonized, cauterized and cut in the normal manner.  A similar procedure was performed on the left. However, extensive left retroperitoneal dissection was necessary to free the left ovary from the sigmoid colon mesentery and from its attachments to the posterior uterus. The left ureter was clearly visualized throughout the dissection. The left ovary was separated and placed in a bag. The colpotomy was made and the uterus, cervix, bilateral ovaries and tubes were amputated and delivered through the vagina.  Pedicles were inspected and excellent hemostasis was achieved.    Frozen section revealed benign pathology of the left ovary.  The colpotomy at the vaginal cuff was closed with Vicryl on a CT1 needle in interrupted figure of 8's and the vagina was closed with a v-loc (2-0) a running manner.  Irrigation was used and excellent hemostasis was achieved.  At this point in the procedure was completed.  Robotic instruments were removed under direct visulaization.  The robot was undocked. The 10 mm ports were closed with Vicryl on a UR-5 needle and the fascia was closed with 0 Vicryl on a UR-5 needle.  The skin was closed with 4-0 Vicryl in a subcuticular manner.  Dermabond was applied.  Sponge, lap and needle counts correct x 2.  The patient was taken to the recovery room in stable condition.  The vagina was swabbed with  minimal bleeding noted.   All instrument and needle counts were correct x  3.   The patient was transferred to the recovery room in a stable condition.  Donaciano Eva, MD

## 2021-07-14 NOTE — Transfer of Care (Signed)
Immediate Anesthesia Transfer of Care Note  Patient: Pamela Price  Procedure(s) Performed: XI ROBOTIC ASSISTED TOTAL HYSTERECTOMY WITH BILATERAL SALPINGO OOPHORECTOMY (Abdomen)  Patient Location: PACU  Anesthesia Type:General  Level of Consciousness: drowsy, responds to name but moaning.    Airway & Oxygen Therapy: Patient Spontanous Breathing and Patient connected to nasal cannula oxygen  Post-op Assessment: Report given to RN, pt has gross crepitus to all extremities. Dr Denman George aware  Post vital signs: Reviewed and stable  Last Vitals:  Vitals Value Taken Time  BP 198/82 07/14/21 1656  Temp    Pulse 79 07/14/21 1700  Resp 17 07/14/21 1700  SpO2 100 % 07/14/21 1700  Vitals shown include unvalidated device data.  Last Pain:  Vitals:   07/14/21 1313  TempSrc: Oral  PainSc: 0-No pain         Complications: No notable events documented.

## 2021-07-14 NOTE — Discharge Instructions (Addendum)
07/14/2021  Return to work: 4 weeks if applicable  Today Dr. Denman George removed your uterus and cervix and both ovaries and fallopian tubes. The left ovary was stuck to the back of the uterus which made a hysterectomy (removal of the uterus and cervix) necessary. On frozen section (where the pathologist looks at the cyst while you are in surgery), the pathologist did not feel the ovarian cyst was cancer. A final report will be issued as well and we will contact you with the results.   Activity: 1. Be up and out of the bed during the day.  Take a nap if needed.  You may walk up steps but be careful and use the hand rail.  Stair climbing will tire you more than you think, you may need to stop part way and rest.   2. No lifting or straining over 10 lbs, pushing, pulling, straining for 6 weeks.  3. No driving for 1 week(s).  Do not drive if you are taking narcotic pain medicine. You need to make sure your reaction time has returned and you can brake safely.  4. Shower daily.  Use your regular soap to bathe and when finished pat your incision dry; don't rub.  No tub baths until cleared by your surgeon.   5. No sexual activity and nothing in the vagina for 8 weeks.  6. You may experience a small amount of clear drainage from your incisions, which is normal.  If the drainage persists or increases, please call the office.  7. You may experience vaginal spotting after surgery or around the 6-8 week mark from surgery when the stitches at the top of the vagina begin to dissolve.  The spotting is normal but if you experience heavy bleeding, call our office.  8. Take Tylenol or ibuprofen first for pain and only use narcotic pain medication for severe pain not relieved by the Tylenol or Ibuprofen.  Monitor your Tylenol intake to a max of 4,000 mg.   Diet: 1. Low sodium Heart Healthy Diet is recommended.  2. It is safe to use a laxative, such as Miralax or Colace, if you have difficulty moving your bowels. You  can take Sennakot at bedtime every evening to keep bowel movements regular and to prevent constipation.    Wound Care: 1. Keep clean and dry.  Shower daily.  Reasons to call the Doctor: Fever - Oral temperature greater than 100.4 degrees Fahrenheit Foul-smelling vaginal discharge Difficulty urinating Nausea and vomiting Increased pain at the site of the incision that is unrelieved with pain medicine. Difficulty breathing with or without chest pain New calf pain especially if only on one side Sudden, continuing increased vaginal bleeding with or without clots.   Contacts: For questions or concerns you should contact:  Dr. [name] at [phone]  [name], NP at [phone]  After Hours: call [phone] and have the GYN Oncologist paged/contacted    Post Anesthesia Home Care Instructions  Activity: Get plenty of rest for the remainder of the day. A responsible individual must stay with you for 24 hours following the procedure.  For the next 24 hours, DO NOT: -Drive a car -Paediatric nurse -Drink alcoholic beverages -Take any medication unless instructed by your physician -Make any legal decisions or sign important papers.  Meals: Start with liquid foods such as gelatin or soup. Progress to regular foods as tolerated. Avoid greasy, spicy, heavy foods. If nausea and/or vomiting occur, drink only clear liquids until the nausea and/or vomiting subsides. Call  your physician if vomiting continues.  Special Instructions/Symptoms: Your throat may feel dry or sore from the anesthesia or the breathing tube placed in your throat during surgery. If this causes discomfort, gargle with warm salt water. The discomfort should disappear within 24 hours.  If you had a scopolamine patch placed behind your ear for the management of post- operative nausea and/or vomiting:  1. The medication in the patch is effective for 72 hours, after which it should be removed.  Wrap patch in a  tissue and discard in the trash. Wash hands thoroughly with soap and water. 2. You may remove the patch earlier than 72 hours if you experience unpleasant side effects which may include dry mouth, dizziness or visual disturbances. 3. Avoid touching the patch. Wash your hands with soap and water after contact with the patch.

## 2021-07-14 NOTE — Anesthesia Procedure Notes (Signed)
Procedure Name: Intubation Date/Time: 07/14/2021 3:00 PM Performed by: Bonney Aid, CRNA Pre-anesthesia Checklist: Patient identified, Emergency Drugs available, Suction available and Patient being monitored Patient Re-evaluated:Patient Re-evaluated prior to induction Oxygen Delivery Method: Circle system utilized Preoxygenation: Pre-oxygenation with 100% oxygen Induction Type: IV induction Ventilation: Mask ventilation without difficulty Laryngoscope Size: Mac and 3 Grade View: Grade I Tube type: Oral Tube size: 7.0 mm Number of attempts: 1 Airway Equipment and Method: Stylet and Oral airway Placement Confirmation: ETT inserted through vocal cords under direct vision, positive ETCO2 and breath sounds checked- equal and bilateral Secured at: 19 cm Tube secured with: Tape Dental Injury: Teeth and Oropharynx as per pre-operative assessment

## 2021-07-14 NOTE — H&P (Signed)
Gynecologic Oncology H&P  Chief Complaint:  Left ovarian cyst   Assessment/Plan:  Pamela Price  is a 87 y.o.  year old with a slowly growing, asymptomatic complex left adnexal cyst, and normal CA 125, incidentally identified on post-surgical CT scan in March, 2020. The cyst increased again slightly on repeat imaging in June, 2022 (compared to November, 2021). She had previously strongly desired to avoid surgery due to her responsibilities caring for her 85 year old legally blind husband and son with schizophrenia. However her husband died this year and she now has improved independence. Therefore I again offered surgery. This time she is in agreement with proceeding.  I am recommending a robotic assisted hysterectomy, BSO, possible staging.  I explained this will be an outpatient surgery and discussed the anticipated risk and convalescence.   HPI: [name] is a 87 year old P3 who was seen in consultation at the request of Dr. Modesta Messing for a complex left ovarian cyst.  The patient's history began when she was noted to have a colonic mass identified on colonoscopy in the winter 2020.  This subsequently was followed with a laparoscopic hemicolectomy performed by Dr. Lilia Pro in [place] in February 12, 2019.  The patient reports the procedure was uncomplicated.  Subsequently malignancy, per patient stage I, was identified in the pathology specimen.  No further adjuvant therapy was recommended.  As part of work-up of her cancer diagnosis and subsequent staging a CT scan of the abdomen and pelvis was performed on February 22, 2019.  This revealed that she was status post right hemicolectomy.  The uterus appeared unremarkable.  A multiloculated cystic structure measuring 8 x 5 cm was noted in the left adnexal region.  There is no right adnexal abnormality noted.  No ascites no carcinomatosis, no lymphadenopathy was appreciated.  A transvaginal ultrasound scan was performed on March 29, 2019.   This confirmed a normal-appearing uterus measuring 6.5 x 4 x 2.6 cm with a 4 mm endometrial thickness.  The right ovary measured 1.4 cm and was grossly normal.  The left ovary measured 6.9 x 7.4 x 4.2 cm with a multicystic abnormality noted within.  The largest single cystic area measured 5.7 cm in diameter.  At least 1 septa appeared to be somewhat thickened.  There is no abnormal free fluid.  Ca1 25 was drawn on April 04, 2019 and was normal at 10.  Follow-up ultrasound scan was performed on May 30, 2019.  This showed a stable appearing left ovarian complex cyst measuring 7.4 x 4.4 x 4.9 cm that was multicystic and avascular.  No cul-de-sac free fluid was identified.  The patient remained asymptomatic from this cyst and strongly desired not proceeding with surgery because her husband was legally blind and required her care as did her schizophrenic son. She did not feel that she could take time to recover from a surgery.  She reported a medical history significant for macular degeneration for which she receives regular bevacizumab injections, She has a history of colon cancer at age 58.  Her past surgical history is significant for 2 prior cesarean sections, tubal ligation, hip replacement, and a hemicolectomy (right) in March 2020. The patient has had one prior vaginal delivery and 2 prior cesarean sections.  Repeat ultrasound performed in [facility] on January 15, 2020 revealed a uterus measuring 5.6 x 3.1 x 3.8 cm.  A 4 mm endometrial stripe.  Right ovary not visualized.  Left ovary measured 8.2 x 4.1 x 6.1 cm containing a  complex cystic mass containing multiple septations of varying thickness with some slight nodularity.  Question of a small mural nodule.  Malignancy could not be excluded.  No definite blood flow within the septations or areas of nodularity on color Doppler. She continued to not desire surgical intervention unless absolutely necessary at that time.   Follow-up ultrasound  scan on 11/09/2020 showed right ovary not visualized, uterus measuring 5.3 x 2 x 4 cm with a 3 mm endometrium.  The left ovary was replaced by a complicated cystic mass measuring 8.3 x 4.1 x 8.8 cm with multiple septations, which are thin and additional areas which are thicker and irregular.  There was several questionable areas of mural nodularity.  There was no internal blood flow on color Doppler imaging within the septations or within the areas of mural nodularity.  No other pelvic findings.  This represented a small growth in the cyst since the previous exam in February 2021.  Interval Hx:  Repeat US at [facility] on 06/03/21 showed a left adnexal mass now measuring 9 x 4 x 8.5cm containing multiple areas of septations and nodularity. Blood flow was detected in some of the septations and nodules. This represented a 7 mm growth  over months.  Her husband died in 05/09/2021.    She continued to be symptom free.   A decision was made to proceed with surgery due to the slight increase in growth of the cystic mass over time.   Current Meds:  Outpatient Encounter Medications as of 06/08/2021  Medication Sig   calcium-vitamin D (OSCAL WITH D) 250-125 MG-UNIT tablet Take 1 tablet by mouth daily. Patient is unsure of mg's   lisinopril (ZESTRIL) 40 MG tablet Take 1 tablet (40 mg total) by mouth 2 (two) times daily.   pravastatin (PRAVACHOL) 40 MG tablet Take 1 tablet (40 mg total) by mouth at bedtime.   No facility-administered encounter medications on file as of 06/08/2021.    Allergy:  Allergies  Allergen Reactions   Sulfa Antibiotics Rash    Broke out in a rash 40 years ago  Broke out in a rash 40 years ago     Social Hx:   Social History   Socioeconomic History   Marital status: Married    Spouse name: Not on file   Number of children: Not on file   Years of education: Not on file   Highest education level: Not on file  Occupational History   Not on file  Tobacco Use   Smoking  status: Former    Types: Cigarettes   Smokeless tobacco: Never   Tobacco comments:    Quit 50 years ago   Vaping Use   Vaping Use: Never used  Substance and Sexual Activity   Alcohol use: Yes    Comment: rare wine   Drug use: No   Sexual activity: Not on file  Other Topics Concern   Not on file  Social History Narrative   Not on file   Social Determinants of Health   Financial Resource Strain: Not on file  Food Insecurity: Not on file  Transportation Needs: Not on file  Physical Activity: Not on file  Stress: Not on file  Social Connections: Not on file  Intimate Partner Violence: Not on file    Past Surgical Hx:  Past Surgical History:  Procedure Laterality Date   BREAST LUMPECTOMY W/ NEEDLE LOCALIZATION     axillary tail of the left  breast    COLON SURGERY  02/2019   for stage I colon cancer   colonscopy  2020   TOTAL HIP ARTHROPLASTY Right 08/30/2017   Procedure: RIGHT TOTAL HIP ARTHROPLASTY ANTERIOR APPROACH;  Surgeon: Gaynelle Arabian, MD;  Location: [facility];  Service: Orthopedics;  Laterality: Right;    Past Medical Hx:  Past Medical History:  Diagnosis Date   Adnexal mass 06/30/2021   Arthritis    History of right hemicolectomy    HLD (hyperlipidemia) 06/30/2021   Hypertension 06/30/2021   Macular degeneration 06/30/2021   bilateral  gets eye injections   Osteoporosis 06/30/2021   Varicose veins of both lower extremities 06/30/2021   right leg only    Past Gynecological History:  See HPI No LMP recorded. Patient is postmenopausal.  Family Hx: History reviewed. No pertinent family history.  Review of Systems:  Constitutional  Feels well,   ENT Normal appearing ears and nares bilaterally Skin/Breast  No rash, sores, jaundice, itching, dryness Cardiovascular  No chest pain, shortness of breath, or edema  Pulmonary  No cough or wheeze.  Gastro Intestinal  No nausea, vomitting, or diarrhoea. No bright red blood per rectum, no abdominal pain,  change in bowel movement, or constipation.  Genito Urinary  No frequency, urgency, dysuria, no bleeding or pelvic pain Musculo Skeletal  No myalgia, arthralgia, joint swelling or pain  Neurologic  No weakness, numbness, change in gait,  Psychology  No depression, anxiety, insomnia.   Physical Exam: WD in NAD Neck  Supple NROM, without any enlargements.  Lymph Node Survey No cervical supraclavicular or inguinal adenopathy Cardiovascular  Well perfused peripheries Lungs  No increased WOB Skin  No rash/lesions/breakdown  Psychiatry  Alert and oriented to person, place, and time  Abdomen  Normoactive bowel sounds, abdomen soft, non-tender and nonobese without evidence of hernia.  Back No CVA tenderness Genito Urinary  deferred Rectal  deferred Extremities  No bilateral cyanosis, clubbing or edema.    Thereasa Solo, MD  07/14/2021, 2:17 PM

## 2021-07-14 NOTE — Anesthesia Preprocedure Evaluation (Addendum)
Anesthesia Evaluation  Patient identified by MRN, date of birth, ID band Patient awake    Reviewed: Allergy & Precautions, NPO status , Patient's Chart, lab work & pertinent test results  Airway Mallampati: I  TM Distance: >3 FB Neck ROM: Full    Dental no notable dental hx.    Pulmonary neg pulmonary ROS, former smoker,    Pulmonary exam normal breath sounds clear to auscultation       Cardiovascular hypertension, Pt. on medications negative cardio ROS Normal cardiovascular exam Rhythm:Regular Rate:Normal     Neuro/Psych negative neurological ROS  negative psych ROS   GI/Hepatic negative GI ROS, Neg liver ROS,   Endo/Other  negative endocrine ROShyperlipidemia  Renal/GU negative Renal ROS  negative genitourinary   Musculoskeletal negative musculoskeletal ROS (+) Arthritis , Osteoarthritis,    Abdominal   Peds negative pediatric ROS (+)  Hematology negative hematology ROS (+)   Anesthesia Other Findings Adnexal mass  Reproductive/Obstetrics negative OB ROS                           Anesthesia Physical Anesthesia Plan  ASA: 2  Anesthesia Plan: General   Post-op Pain Management:    Induction: Intravenous  PONV Risk Score and Plan: Ondansetron, Dexamethasone and Treatment may vary due to age or medical condition  Airway Management Planned: Oral ETT  Additional Equipment:   Intra-op Plan:   Post-operative Plan: Extubation in OR  Informed Consent: I have reviewed the patients History and Physical, chart, labs and discussed the procedure including the risks, benefits and alternatives for the proposed anesthesia with the patient or authorized representative who has indicated his/her understanding and acceptance.     Dental advisory given  Plan Discussed with: CRNA, Anesthesiologist and Surgeon  Anesthesia Plan Comments: (Did not take lisinopril this AM.)       Anesthesia  Quick Evaluation

## 2021-07-15 ENCOUNTER — Encounter (HOSPITAL_BASED_OUTPATIENT_CLINIC_OR_DEPARTMENT_OTHER): Payer: Self-pay | Admitting: Gynecologic Oncology

## 2021-07-15 DIAGNOSIS — N736 Female pelvic peritoneal adhesions (postinfective): Secondary | ICD-10-CM | POA: Diagnosis not present

## 2021-07-15 DIAGNOSIS — Z882 Allergy status to sulfonamides status: Secondary | ICD-10-CM | POA: Diagnosis not present

## 2021-07-15 DIAGNOSIS — N9489 Other specified conditions associated with female genital organs and menstrual cycle: Secondary | ICD-10-CM | POA: Diagnosis not present

## 2021-07-15 DIAGNOSIS — Z85038 Personal history of other malignant neoplasm of large intestine: Secondary | ICD-10-CM | POA: Diagnosis not present

## 2021-07-15 DIAGNOSIS — N800 Endometriosis of uterus: Secondary | ICD-10-CM | POA: Diagnosis not present

## 2021-07-15 DIAGNOSIS — D259 Leiomyoma of uterus, unspecified: Secondary | ICD-10-CM | POA: Diagnosis not present

## 2021-07-15 DIAGNOSIS — N888 Other specified noninflammatory disorders of cervix uteri: Secondary | ICD-10-CM | POA: Diagnosis not present

## 2021-07-15 DIAGNOSIS — Z87891 Personal history of nicotine dependence: Secondary | ICD-10-CM | POA: Diagnosis not present

## 2021-07-15 DIAGNOSIS — N838 Other noninflammatory disorders of ovary, fallopian tube and broad ligament: Secondary | ICD-10-CM | POA: Diagnosis not present

## 2021-07-15 DIAGNOSIS — D271 Benign neoplasm of left ovary: Secondary | ICD-10-CM | POA: Diagnosis not present

## 2021-07-15 DIAGNOSIS — Z9049 Acquired absence of other specified parts of digestive tract: Secondary | ICD-10-CM | POA: Diagnosis not present

## 2021-07-15 DIAGNOSIS — Z79899 Other long term (current) drug therapy: Secondary | ICD-10-CM | POA: Diagnosis not present

## 2021-07-15 MED ORDER — SIMETHICONE 80 MG PO CHEW
80.0000 mg | CHEWABLE_TABLET | Freq: Four times a day (QID) | ORAL | Status: DC | PRN
  Administered 2021-07-15 (×2): 80 mg via ORAL

## 2021-07-15 MED ORDER — KETOROLAC TROMETHAMINE 15 MG/ML IJ SOLN
INTRAMUSCULAR | Status: AC
  Filled 2021-07-15: qty 1

## 2021-07-15 MED ORDER — IBUPROFEN 200 MG PO TABS
ORAL_TABLET | ORAL | Status: AC
  Filled 2021-07-15: qty 3

## 2021-07-15 MED ORDER — SIMETHICONE 80 MG PO CHEW
CHEWABLE_TABLET | ORAL | Status: AC
  Filled 2021-07-15: qty 1

## 2021-07-15 MED ORDER — TRAMADOL HCL 50 MG PO TABS
ORAL_TABLET | ORAL | Status: AC
  Filled 2021-07-15: qty 1

## 2021-07-15 MED ORDER — IBUPROFEN 200 MG PO TABS
600.0000 mg | ORAL_TABLET | Freq: Four times a day (QID) | ORAL | Status: DC | PRN
  Administered 2021-07-15: 600 mg via ORAL

## 2021-07-15 NOTE — Progress Notes (Signed)
While pt lying in bed, pt with minimal to no discomfort, O2 sat 100% on RA, VSS. Upon trying to assist pt to sitting position to get OOB for toileting, pt became SOB, stated she had chest pain/pressure with right shoulder pain. VSS, remain with O2 sat at 100%, O2 [place] applied for comfort. Crepitus noted to bilateral hands, knees, BLE. Emotional support given, pursed lip breathing techniques explained. NP and Dr. Denman George to room, MD explained pt is stable and swelling and crepitus will decrease with time and mobility. Son notified, pt and son verbalized understanding, orders given to D/C home. Pt resting comfortably in recliner. Will continue to monitor.

## 2021-07-15 NOTE — Progress Notes (Signed)
Spoke with Dr. Denman George.  Pt walked with two staff members approx 350 feet in hall.  C/O pain in chest and right shoulder when taking deep breaths.  Instructed pt to walk frequently once discharged and use rocking chair at home.  Pt in recliner in home and eating about 75% of her meal.  Son, Ronalee Belts, called and will be here shortly to pick her up.  Discharge instructions will be reviewed with pt and son.

## 2021-07-15 NOTE — Progress Notes (Addendum)
Dr. Denman George notified pt moaning in pain , cant catch her breath having chest pain right upper chest pain.  Os2 sat 99%on room air pulse in 90's, BP being taken.  Dr. Denman George ordered motrin and NP will come see pt  . Dr Denman George states this is normal to have gas pain under rib cage.

## 2021-07-15 NOTE — Progress Notes (Signed)
1 Day Post-Op Procedure(s) (LRB): XI ROBOTIC ASSISTED TOTAL HYSTERECTOMY WITH BILATERAL SALPINGO OOPHORECTOMY (N/A)  Subjective: Patient reports having difficulty taking a deep breath. Reporting moderate discomfort in her upper abdomen, chest, and back. The pain worsens with breathing and movement. Ate pancakes this am but states the pain interfered with eating. No nausea or emesis reported. Denies moderate incisional pain. Able to swallow without difficulty.   Objective: Vital signs in last 24 hours: Temp:  [94.3 F (34.6 C)-98.6 F (37 C)] 98.3 F (36.8 C) (08/04 0615) Pulse Rate:  [64-93] 93 (08/04 0804) Resp:  [11-21] 18 (08/04 0225) BP: (123-198)/(56-142) 179/81 (08/04 0804) SpO2:  [95 %-100 %] 100 % (08/04 0804) Weight:  [107 lb 8 oz (48.8 kg)] 107 lb 8 oz (48.8 kg) (08/03 1313)    Intake/Output from previous day: 08/03 0701 - 08/04 0700 In: 1200 [I.V.:1100; IV Piggyback:100] Out: 625 [Urine:600; Blood:25]  Physical Examination: General: alert, moderate distress, and appears uncomfortable  Resp: clear to auscultation bilaterally and slightly diminished in the bases with pt not able to take deep breaths Cardio: Regular heart rhythm with intermittent short pauses GI: soft, non-tender; bowel sounds normal; no masses,  no organomegaly and incision: lap sites intact with dermabond present. Mild ecchymosis noted around the LUQ incision and the umbilical incision. Extremities: extremities normal, atraumatic, no cyanosis or edema Mild facial and upper neck edema noted  Assessment: 87 y.o. s/p Procedure(s): XI ROBOTIC ASSISTED TOTAL HYSTERECTOMY WITH BILATERAL SALPINGO OOPHORECTOMY: stable. Patient was to be discharged the same day of surgery but anesthesia felt given moderate crepitus and facial swelling, the patient should stay overnight.  Pain:  Pain is well-controlled on PRN medications.  CV: BP and HR are stable. Continue to monitor [facility] in the [facility].   GI:   Tolerating po: Yes. Antiemetics ordered as needed.  GU: Voiding with some hesitation and slow to empty. No residual per RN on bladder scanner.   Prophylaxis: SCD.  Plan: Ibuprofen given. Kpad at the bedside Patient resting comfortably when leaving the room EKG performed-no acute findings Plan for discharge later in am. Dr. Denman George to see the patient later this am.   LOS: 0 days    Pamela Price 07/15/2021, 9:31 AM

## 2021-07-16 ENCOUNTER — Telehealth: Payer: Self-pay

## 2021-07-16 LAB — CYTOLOGY - NON PAP

## 2021-07-16 NOTE — Anesthesia Postprocedure Evaluation (Signed)
Anesthesia Post Note  Patient: [name]  Procedure(s) Performed: XI ROBOTIC ASSISTED TOTAL HYSTERECTOMY WITH BILATERAL SALPINGO OOPHORECTOMY (Abdomen)     Patient location during evaluation: PACU Anesthesia Type: General Level of consciousness: awake and alert Pain management: pain level controlled Vital Signs Assessment: post-procedure vital signs reviewed and stable Respiratory status: spontaneous breathing, nonlabored ventilation, respiratory function stable and patient connected to nasal cannula oxygen Cardiovascular status: blood pressure returned to baseline and stable Postop Assessment: no apparent nausea or vomiting Anesthetic complications: no Comments: Severe crepitus extending to right leg, right arm, neck, and face. Patient should be kept overnight for observation and resolution of crepitus.    No notable events documented.  Last Vitals:  Vitals:   07/15/21 0804 07/15/21 1020  BP: (!) 179/81 140/62  Pulse: 93 69  Resp:  14  Temp:  36.8 C  SpO2: 100% 100%    Last Pain:  Vitals:   07/16/21 1120  TempSrc:   PainSc: 0-No pain                 Pamela Price

## 2021-07-16 NOTE — Telephone Encounter (Signed)
Spoke with Pamela Price. She states she is eating, drinking and urinating well. She has not had a BM yet but is passing gas. She is taking senokot as prescribed and encouraged her to drink plenty of water. She will increase the senokot to 2 tabs bid and add a capful of Miralax bid. She denies fever or chills. Incisions are dry and intact. Her pain is controlled with Ibuprofen '400mg'$  Q 6 hours and Tramadol. She continues with the crepitous in face ,neck, and legs.  Continues to have pain in chest with deep inspiration.  Pain medication helping to decrease the chest discomfort. Suggested that she try a heating pad to her back to help with her discomfort.  Instructed to call office with any fever, chills, purulent drainage, uncontrolled pain or any other questions or concerns. Patient verbalizes understanding.  Pt aware of post op appointments as well as the office number [phone] and after hours number [phone] to call if she has any questions or concerns

## 2021-07-19 LAB — SURGICAL PATHOLOGY

## 2021-07-20 ENCOUNTER — Telehealth: Payer: Self-pay

## 2021-07-20 NOTE — Telephone Encounter (Signed)
Told ms Joost the the final surgical pathology showed no cancer. Everything was benign per [name], NP.

## 2021-07-26 ENCOUNTER — Telehealth: Payer: Self-pay

## 2021-07-26 NOTE — Telephone Encounter (Signed)
Returned Pamela Price's call regarding her surgery on 8/3. Patient inquiring what surgical procedure she had so she can tell her PCP. Informed her that it was a total hysterectomy. Her uterus, cervix, bilateral tubes and bilateral ovaries were removed. Patient verbalized understanding.

## 2021-08-11 ENCOUNTER — Other Ambulatory Visit: Payer: Self-pay

## 2021-08-11 ENCOUNTER — Inpatient Hospital Stay: Payer: Medicare HMO | Attending: Gynecologic Oncology | Admitting: Gynecologic Oncology

## 2021-08-11 VITALS — BP 163/67 | HR 78 | Temp 98.4°F | Resp 16 | Ht <= 58 in | Wt 107.9 lb

## 2021-08-11 DIAGNOSIS — Z90722 Acquired absence of ovaries, bilateral: Secondary | ICD-10-CM | POA: Diagnosis not present

## 2021-08-11 DIAGNOSIS — Z90710 Acquired absence of both cervix and uterus: Secondary | ICD-10-CM | POA: Insufficient documentation

## 2021-08-11 DIAGNOSIS — D271 Benign neoplasm of left ovary: Secondary | ICD-10-CM | POA: Insufficient documentation

## 2021-08-11 DIAGNOSIS — N83202 Unspecified ovarian cyst, left side: Secondary | ICD-10-CM

## 2021-08-11 NOTE — Progress Notes (Signed)
Gynecologic Oncology Follow-up  Chief Complaint:  Chief Complaint  Patient presents with   Left ovarian cyst    Assessment/Plan:  Pamela Price  is a 87 y.o.  year old with a slowly growing, left adenxal mass. S/p robotic hysterectomy and BSO on 07/14/2021 for benign pathology in the left ovary.  We spent extensive time discussing the event surrounding her surgery.  Pamela Price expressed that she was upset with preoperative communication regarding the need for hysterectomy, and lack of preparedness for the discomfort from retention of gas that she experienced postoperatively.  I apologized for these lapses in perioperative counseling and care and asked if there was anything I could do to make up for her disappointment in the procedure.  She vocalized understanding and acceptance and my apology for the shortcomings in the counseling and care we offered her.  She requires no ongoing scheduled visits for this condition but will return on a prn basis.    HPI: Pamela Price is a 87 year old P3 who was seen in consultation at the request of Dr. Modesta Messing for a complex left ovarian cyst.  The patient's history began when she was noted to have a colonic mass identified on colonoscopy in the winter 2020.  This subsequently was followed with a laparoscopic hemicolectomy performed by Dr. Lilia Pro in [place] in February 12, 2019.  The patient reports the procedure was uncomplicated.  Subsequently malignancy, per patient stage I, was identified in the pathology specimen.  No further adjuvant therapy was recommended.  As part of work-up of her cancer diagnosis and subsequent staging a CT scan of the abdomen and pelvis was performed on February 22, 2019.  This revealed that she was status post right hemicolectomy.  The uterus appeared unremarkable.  A multiloculated cystic structure measuring 8 x 5 cm was noted in the left adnexal region.  There is no right adnexal abnormality noted.  No ascites no  carcinomatosis, no lymphadenopathy was appreciated.  A transvaginal ultrasound scan was performed on March 29, 2019.  This confirmed a normal-appearing uterus measuring 6.5 x 4 x 2.6 cm with a 4 mm endometrial thickness.  The right ovary measured 1.4 cm and was grossly normal.  The left ovary measured 6.9 x 7.4 x 4.2 cm with a multicystic abnormality noted within.  The largest single cystic area measured 5.7 cm in diameter.  At least 1 septa appeared to be somewhat thickened.  There is no abnormal free fluid.  Ca1 25 was drawn on April 04, 2019 and was normal at 10.  Follow-up ultrasound scan was performed on May 30, 2019.  This showed a stable appearing left ovarian complex cyst measuring 7.4 x 4.4 x 4.9 cm that was multicystic and avascular.  No cul-de-sac free fluid was identified.  The patient remained asymptomatic from this cyst and strongly desired not proceeding with surgery because her husband was legally blind and required her care as did her schizophrenic son. She did not feel that she could take time to recover from a surgery.  She reported a medical history significant for macular degeneration for which she receives regular bevacizumab injections, She has a history of colon cancer at age 75.  Her past surgical history is significant for 2 prior cesarean sections, tubal ligation, hip replacement, and a hemicolectomy (right) in March 2020. The patient has had one prior vaginal delivery and 2 prior cesarean sections.  Repeat ultrasound performed in [facility] on January 15, 2020 revealed a uterus measuring 5.6 x 3.1 x  3.8 cm.  A 4 mm endometrial stripe.  Right ovary not visualized.  Left ovary measured 8.2 x 4.1 x 6.1 cm containing a complex cystic mass containing multiple septations of varying thickness with some slight nodularity.  Question of a small mural nodule.  Malignancy could not be excluded.  No definite blood flow within the septations or areas of nodularity on color  Doppler. She continued to not desire surgical intervention unless absolutely necessary at that time.   Follow-up ultrasound scan on 11/09/2020 showed right ovary not visualized, uterus measuring 5.3 x 2 x 4 cm with a 3 mm endometrium.  The left ovary was replaced by a complicated cystic mass measuring 8.3 x 4.1 x 8.8 cm with multiple septations, which are thin and additional areas which are thicker and irregular.  There was several questionable areas of mural nodularity.  There was no internal blood flow on color Doppler imaging within the septations or within the areas of mural nodularity.  No other pelvic findings.  This represented a small growth in the cyst since the previous exam in February 2021.  Repeat US at [facility] on 06/03/21 showed a left adnexal mass now measuring 9 x 4 x 8.5cm containing multiple areas of septations and nodularity. Blood flow was detected in some of the septations and nodules. This represented a 7 mm growth  over months.  Her husband died in May 08, 2021.    She continued to be symptom free but, at my recommendation, due to complexity of the mass, agreed to surgery in the Summer.  Interval Hx:  On 8/3/22she underwent robotic assisted total abdominal hysterectomy with BSO. Intraoperative findings were significant for a 6 cm normal-appearing uterus, normal right tube and ovary, normal upper abdomen.  8 cm complex left adnexal cyst adherent with the sigmoid colon mesentery.  Benign on frozen section. Surgery was complicated by development of subcutaneous emphysema which required an overnight stay to resolve.  Final pathology revealed a benign serous cystadenofibroma with calcifications but no malignancy in the left tube and ovary.  Since surgery she has done well with no new complaints after resolution of her gas.   Current Meds:  Outpatient Encounter Medications as of 08/11/2021  Medication Sig   calcium-vitamin D (OSCAL WITH D) 250-125 MG-UNIT tablet Take 1 tablet by  mouth daily. Patient is unsure of mg's   lisinopril (ZESTRIL) 40 MG tablet Take 1 tablet (40 mg total) by mouth 2 (two) times daily.   Multiple Vitamin (MULTIVITAMIN ADULT PO) Take 1 tablet by mouth daily.   Multiple Vitamins-Minerals (PRESERVISION AREDS 2) CAPS Take 1 capsule by mouth 2 (two) times daily.   pravastatin (PRAVACHOL) 40 MG tablet Take 1 tablet (40 mg total) by mouth at bedtime.   Ferrous Sulfate (IRON HIGH-POTENCY PO) Take 65 mg by mouth daily.   senna-docusate (SENOKOT-S) 8.6-50 MG tablet Take 2 tablets by mouth at bedtime. For AFTER surgery, do not take if having diarrhea (Patient not taking: Reported on 08/11/2021)   traMADol (ULTRAM) 50 MG tablet Take 1 tablet (50 mg total) by mouth every 6 (six) hours as needed for severe pain. For AFTER surgery only, do not take and drive. Supervising:Dr. [name], [id number] (Patient not taking: Reported on 08/11/2021)   vitamin B-12 (CYANOCOBALAMIN) 500 MCG tablet Take 500 mcg by mouth in the Price and at bedtime.   No facility-administered encounter medications on file as of 08/11/2021.    Allergy:  Allergies  Allergen Reactions   Sulfa Antibiotics Rash  Broke out in a rash 40 years ago  Broke out in a rash 40 years ago     Social Hx:   Social History   Socioeconomic History   Marital status: Married    Spouse name: Not on file   Number of children: Not on file   Years of education: Not on file   Highest education level: Not on file  Occupational History   Not on file  Tobacco Use   Smoking status: Former    Types: Cigarettes   Smokeless tobacco: Never   Tobacco comments:    Quit 50 years ago   Vaping Use   Vaping Use: Never used  Substance and Sexual Activity   Alcohol use: Yes    Comment: rare wine   Drug use: No   Sexual activity: Not on file  Other Topics Concern   Not on file  Social History Narrative   Not on file   Social Determinants of Health   Financial Resource Strain: Not on file  Food  Insecurity: Not on file  Transportation Needs: Not on file  Physical Activity: Not on file  Stress: Not on file  Social Connections: Not on file  Intimate Partner Violence: Not on file    Past Surgical Hx:  Past Surgical History:  Procedure Laterality Date   BREAST LUMPECTOMY W/ NEEDLE LOCALIZATION     axillary tail of the left  breast    COLON SURGERY  02/2019   for stage I colon cancer   colonscopy  2020   ROBOTIC ASSISTED BILATERAL SALPINGO OOPHERECTOMY N/A 07/14/2021   Procedure: XI ROBOTIC ASSISTED TOTAL HYSTERECTOMY WITH BILATERAL SALPINGO OOPHORECTOMY;  Surgeon: [name], MD;  Location: [facility];  Service: Gynecology;  Laterality: N/A;   TOTAL HIP ARTHROPLASTY Right 08/30/2017   Procedure: RIGHT TOTAL HIP ARTHROPLASTY ANTERIOR APPROACH;  Surgeon: Gaynelle Arabian, MD;  Location: [facility];  Service: Orthopedics;  Laterality: Right;    Past Medical Hx:  Past Medical History:  Diagnosis Date   Adnexal mass 06/30/2021   Arthritis    History of right hemicolectomy    HLD (hyperlipidemia) 06/30/2021   Hypertension 06/30/2021   Macular degeneration 06/30/2021   bilateral  gets eye injections   Osteoporosis 06/30/2021   Varicose veins of both lower extremities 06/30/2021   right leg only    Past Gynecological History:  See HPI No LMP recorded. Patient is postmenopausal.  Family Hx: No family history on file.  Review of Systems:  Constitutional  Feels well,   ENT Normal appearing ears and nares bilaterally Skin/Breast  No rash, sores, jaundice, itching, dryness Cardiovascular  No chest pain, shortness of breath, or edema  Pulmonary  No cough or wheeze.  Gastro Intestinal  No nausea, vomitting, or diarrhoea. No bright red blood per rectum, no abdominal pain, change in bowel movement, or constipation.  Genito Urinary  No frequency, urgency, dysuria, no bleeding or pelvic pain Musculo Skeletal  No myalgia, arthralgia, joint swelling or pain   Neurologic  No weakness, numbness, change in gait,  Psychology  No depression, anxiety, insomnia.   Physical Exam: Abdominal incisions well healed. Vaginal cuff, in tact, suture material present.     Thereasa Solo, MD  08/11/2021, 5:54 PM

## 2021-08-11 NOTE — Patient Instructions (Addendum)
Dr Denman George apologizes for the bad experience that you had with your surgery, and her poor explanation of the planned procedure (hysterectomy). You may now swim and begin getting back in to all activities, including heavy lifting.   Please call her at the cancer center [phone]) if you have any questions related to the surgery.

## 2021-09-03 ENCOUNTER — Ambulatory Visit (INDEPENDENT_AMBULATORY_CARE_PROVIDER_SITE_OTHER): Payer: Medicare HMO | Admitting: Family Medicine

## 2021-09-03 ENCOUNTER — Other Ambulatory Visit: Payer: Self-pay

## 2021-09-03 ENCOUNTER — Encounter: Payer: Self-pay | Admitting: Family Medicine

## 2021-09-03 VITALS — BP 142/70 | HR 84 | Temp 97.1°F | Resp 16 | Ht <= 58 in | Wt 108.6 lb

## 2021-09-03 DIAGNOSIS — I10 Essential (primary) hypertension: Secondary | ICD-10-CM | POA: Diagnosis not present

## 2021-09-03 DIAGNOSIS — E782 Mixed hyperlipidemia: Secondary | ICD-10-CM | POA: Diagnosis not present

## 2021-09-03 DIAGNOSIS — Z23 Encounter for immunization: Secondary | ICD-10-CM

## 2021-09-03 MED ORDER — LISINOPRIL 40 MG PO TABS: 40.0000 mg | ORAL_TABLET | Freq: Two times a day (BID) | ORAL | 2 refills | Status: DC

## 2021-09-03 MED ORDER — PRAVASTATIN SODIUM 40 MG PO TABS: 40.0000 mg | ORAL_TABLET | Freq: Every day | ORAL | 2 refills | Status: DC

## 2021-09-03 NOTE — Patient Instructions (Signed)
Call dermatology.

## 2021-09-03 NOTE — Assessment & Plan Note (Signed)
Well controlled. No changes to medicines.  Continue to work on eating a healthy diet and exercise.    

## 2021-09-03 NOTE — Assessment & Plan Note (Signed)
The current medical regimen is effective;  continue present plan and medications. Recommend continue to work on eating healthy diet and exercise.  

## 2021-09-03 NOTE — Progress Notes (Signed)
Subjective:  Patient ID: Pamela Price, female    DOB: 1933/01/13  Age: 87 y.o. MRN: [id number]  Chief Complaint  Patient presents with  . Hyperlipidemia  . Hypertension    HPI Hypertension: on zestril 40 mg one twice a day. BP at home has been 130s/70s. Hyperlipidemia: On pravastatin 40 mg once daily.  Pt is s/p TAHBSO in July 2022 for abnormal ovarian cyst. Fortunately biopsy came back benign. Pt had issues with swelling after surgery.  Pt's husband died in 2021-05-21 and she is still dealing with a lot of estate issues. It is stressful. She also has an adult schizophrenic son who is a huge stress in her life. She recently was able to let DSS take over guardianship, which is a huge weight lifted off her.  Patient seems to be forgetful today. She admits to forgetting more than she used to.  Also, she is concerned about a skin lesion on her left cheek and would like her back checked.    Current Outpatient Medications on File Prior to Visit  Medication Sig Dispense Refill  . calcium-vitamin D (OSCAL WITH D) 250-125 MG-UNIT tablet Take 1 tablet by mouth daily. Patient is unsure of mg's    . Ferrous Sulfate (IRON HIGH-POTENCY PO) Take 65 mg by mouth daily.    Marland Kitchen lisinopril (ZESTRIL) 40 MG tablet Take 1 tablet (40 mg total) by mouth 2 (two) times daily. 180 tablet 0  . Multiple Vitamin (MULTIVITAMIN ADULT PO) Take 1 tablet by mouth daily.    . Multiple Vitamins-Minerals (PRESERVISION AREDS 2) CAPS Take 1 capsule by mouth 2 (two) times daily.    . pravastatin (PRAVACHOL) 40 MG tablet Take 1 tablet (40 mg total) by mouth at bedtime. 90 tablet 1  . senna-docusate (SENOKOT-S) 8.6-50 MG tablet Take 2 tablets by mouth at bedtime. For AFTER surgery, do not take if having diarrhea (Patient not taking: Reported on 08/11/2021) 30 tablet 0  . traMADol (ULTRAM) 50 MG tablet Take 1 tablet (50 mg total) by mouth every 6 (six) hours as needed for severe pain. For AFTER surgery only, do not take and drive.  Supervising:Dr. [name], [id number] (Patient not taking: Reported on 08/11/2021) 10 tablet 0  . vitamin B-12 (CYANOCOBALAMIN) 500 MCG tablet Take 500 mcg by mouth in the morning and at bedtime.     No current facility-administered medications on file prior to visit.   Past Medical History:  Diagnosis Date  . Adnexal mass 06/30/2021  . Arthritis   . History of right hemicolectomy   . HLD (hyperlipidemia) 06/30/2021  . Hypertension 06/30/2021  . Macular degeneration 06/30/2021   bilateral  gets eye injections  . Osteoporosis 06/30/2021  . Varicose veins of both lower extremities 06/30/2021   right leg only   Past Surgical History:  Procedure Laterality Date  . BREAST LUMPECTOMY W/ NEEDLE LOCALIZATION     axillary tail of the left  breast   . COLON SURGERY  02/2019   for stage I colon cancer  . colonscopy  2020  . ROBOTIC ASSISTED BILATERAL SALPINGO OOPHERECTOMY N/A 07/14/2021   Procedure: XI ROBOTIC ASSISTED TOTAL HYSTERECTOMY WITH BILATERAL SALPINGO OOPHORECTOMY;  Surgeon: [name], MD;  Location: [facility];  Service: Gynecology;  Laterality: N/A;  . TOTAL HIP ARTHROPLASTY Right 08/30/2017   Procedure: RIGHT TOTAL HIP ARTHROPLASTY ANTERIOR APPROACH;  Surgeon: Gaynelle Arabian, MD;  Location: [facility];  Service: Orthopedics;  Laterality: Right;    No family history on file. Social History  Socioeconomic History  . Marital status: Married    Spouse name: Not on file  . Number of children: Not on file  . Years of education: Not on file  . Highest education level: Not on file  Occupational History  . Not on file  Tobacco Use  . Smoking status: Former    Types: Cigarettes  . Smokeless tobacco: Never  . Tobacco comments:    Quit 50 years ago   Vaping Use  . Vaping Use: Never used  Substance and Sexual Activity  . Alcohol use: Yes    Comment: rare wine  . Drug use: No  . Sexual activity: Not on file  Other Topics Concern  . Not on file  Social History  Narrative  . Not on file   Social Determinants of Health   Financial Resource Strain: Not on file  Food Insecurity: Not on file  Transportation Needs: Not on file  Physical Activity: Not on file  Stress: Not on file  Social Connections: Not on file    Review of Systems  Constitutional:  Negative for chills, fatigue and fever.  HENT:  Positive for rhinorrhea. Negative for congestion and sore throat.   Respiratory:  Negative for cough and shortness of breath.   Cardiovascular:  Negative for chest pain.  Gastrointestinal:  Negative for abdominal pain, constipation, diarrhea, nausea and vomiting.  Genitourinary:  Negative for dysuria and urgency.  Musculoskeletal:  Negative for back pain and myalgias.  Neurological:  Negative for dizziness, weakness, light-headedness and headaches.  Psychiatric/Behavioral:  Negative for dysphoric mood. The patient is not nervous/anxious.     Objective:  BP (!) 142/70   Pulse 84   Temp (!) 97.1 F (36.2 C)   Resp 16   Ht 4\' 9"  (1.448 m)   Wt 108 lb 9.6 oz (49.3 kg)   BMI 23.50 kg/m   BP/Weight 09/03/2021 03/14/4741 04/19/5637  Systolic BP 756 433 295  Diastolic BP 70 67 62  Wt. (Lbs) 108.6 107.9 -  BMI 23.5 23.35 -    Physical Exam Vitals reviewed.  Constitutional:      Appearance: Normal appearance. She is normal weight.  Neck:     Vascular: No carotid bruit.  Cardiovascular:     Rate and Rhythm: Normal rate and regular rhythm.     Pulses: Normal pulses.     Heart sounds: Normal heart sounds.  Pulmonary:     Effort: Pulmonary effort is normal. No respiratory distress.     Breath sounds: Normal breath sounds.  Abdominal:     General: Abdomen is flat. Bowel sounds are normal.     Palpations: Abdomen is soft.     Tenderness: There is no abdominal tenderness.  Skin:    Findings: Lesion (left cheek. irregular coloring.Marland Kitchen 5-10 mm in diameter. Back: SKs.) present.  Neurological:     Mental Status: She is alert and oriented to person,  place, and time.  Psychiatric:        Mood and Affect: Mood normal.        Behavior: Behavior normal.    Diabetic Foot Exam - Simple   No data filed      Lab Results  Component Value Date   WBC 6.3 07/12/2021   HGB 13.6 07/12/2021   HCT 41.9 07/12/2021   PLT 218 07/12/2021   GLUCOSE 90 07/12/2021   CHOL 170 06/02/2021   TRIG 140 06/02/2021   HDL 58 06/02/2021   LDLCALC 88 06/02/2021   ALT 20 07/12/2021  AST 20 07/12/2021   NA 139 07/12/2021   K 4.5 07/12/2021   CL 103 07/12/2021   CREATININE 0.63 07/12/2021   BUN 19 07/12/2021   CO2 28 07/12/2021   TSH 1.720 10/05/2020   INR 1.01 08/24/2017      Assessment & Plan:   Problem List Items Addressed This Visit   None .  No orders of the defined types were placed in this encounter.   No orders of the defined types were placed in this encounter.    Follow-up: No follow-ups on file.  An After Visit Summary was printed and given to the patient.  [name], MD [facility] [phone]

## 2021-09-10 DIAGNOSIS — Z1231 Encounter for screening mammogram for malignant neoplasm of breast: Secondary | ICD-10-CM | POA: Diagnosis not present

## 2021-09-10 DIAGNOSIS — M810 Age-related osteoporosis without current pathological fracture: Secondary | ICD-10-CM | POA: Diagnosis not present

## 2021-09-10 LAB — HM MAMMOGRAPHY

## 2021-09-10 LAB — HM DEXA SCAN

## 2021-09-14 ENCOUNTER — Telehealth: Payer: Self-pay

## 2021-09-14 NOTE — Telephone Encounter (Signed)
Attempted to call pt to make appointment. Received DEXA results: osteoporosis has worsened Dr Tobie Poet recommends appointment with her to discuss this. Left VM for return call to clinic.   Pamela Price, [place] 09/14/21 4:28 PM

## 2021-09-15 ENCOUNTER — Encounter: Payer: Self-pay | Admitting: Family Medicine

## 2021-09-23 DIAGNOSIS — H353231 Exudative age-related macular degeneration, bilateral, with active choroidal neovascularization: Secondary | ICD-10-CM | POA: Diagnosis not present

## 2021-09-28 ENCOUNTER — Ambulatory Visit (INDEPENDENT_AMBULATORY_CARE_PROVIDER_SITE_OTHER): Payer: Medicare HMO

## 2021-09-28 ENCOUNTER — Other Ambulatory Visit: Payer: Self-pay

## 2021-09-28 ENCOUNTER — Ambulatory Visit: Payer: Medicare HMO

## 2021-09-28 ENCOUNTER — Encounter: Payer: Self-pay | Admitting: Family Medicine

## 2021-09-28 ENCOUNTER — Ambulatory Visit (INDEPENDENT_AMBULATORY_CARE_PROVIDER_SITE_OTHER): Payer: Medicare HMO | Admitting: Family Medicine

## 2021-09-28 VITALS — BP 136/64 | HR 67 | Temp 96.7°F | Ht <= 58 in | Wt 110.0 lb

## 2021-09-28 DIAGNOSIS — Z23 Encounter for immunization: Secondary | ICD-10-CM

## 2021-09-28 DIAGNOSIS — E782 Mixed hyperlipidemia: Secondary | ICD-10-CM

## 2021-09-28 DIAGNOSIS — H3530 Unspecified macular degeneration: Secondary | ICD-10-CM | POA: Insufficient documentation

## 2021-09-28 DIAGNOSIS — M810 Age-related osteoporosis without current pathological fracture: Secondary | ICD-10-CM | POA: Diagnosis not present

## 2021-09-28 DIAGNOSIS — I10 Essential (primary) hypertension: Secondary | ICD-10-CM | POA: Diagnosis not present

## 2021-09-28 NOTE — Progress Notes (Addendum)
Subjective:  Patient ID: Pamela Price, female    DOB: [birth year]  Age: 87 y.o. MRN: [id number]  Chief Complaint  Patient presents with   Osteoporosis    Follow up on DEXA scan results   HPI Osteoporosis: Took fosamax 70 mg once weekly for years. I stopped it about a year ago.On calcium with D. Macular degeneration: Preservision. Injections every 3 months.  Hypertension: Lisinopril 40 mg one twice a day. At home 133/70s. Hyperlipidemia: on pravastatin 40 mg once at night.  Anemia: B12 500 mcg daily.   Current Outpatient Medications on File Prior to Visit  Medication Sig Dispense Refill   calcium-vitamin D (OSCAL WITH D) 250-125 MG-UNIT tablet Take 1 tablet by mouth daily. Patient is unsure of mg's     lisinopril (ZESTRIL) 40 MG tablet Take 1 tablet (40 mg total) by mouth 2 (two) times daily. 180 tablet 2   Multiple Vitamin (MULTIVITAMIN ADULT PO) Take 1 tablet by mouth daily.     Multiple Vitamins-Minerals (PRESERVISION AREDS 2) CAPS Take 1 capsule by mouth 2 (two) times daily.     pravastatin (PRAVACHOL) 40 MG tablet Take 1 tablet (40 mg total) by mouth at bedtime. 90 tablet 2   vitamin B-12 (CYANOCOBALAMIN) 500 MCG tablet Take 500 mcg by mouth in the morning and at bedtime.     No current facility-administered medications on file prior to visit.   Past Medical History:  Diagnosis Date   Adnexal mass 06/30/2021   Arthritis    History of right hemicolectomy    HLD (hyperlipidemia) 06/30/2021   Hypertension 06/30/2021   Left ovarian cyst 07/22/2019   Macular degeneration 06/30/2021   bilateral  gets eye injections   Osteoporosis 06/30/2021   Varicose veins of both lower extremities 06/30/2021   right leg only   Past Surgical History:  Procedure Laterality Date   BREAST LUMPECTOMY W/ NEEDLE LOCALIZATION     axillary tail of the left  breast    COLON SURGERY  02/2019   for stage I colon cancer   colonscopy  2020   ROBOTIC ASSISTED BILATERAL SALPINGO OOPHERECTOMY  N/A 07/14/2021   ROBOTIC ASSISTED TOTAL HYSTERECTOMY WITH BILATERAL SALPINGO OOPHERECTOMY  07/14/2021   TOTAL HIP ARTHROPLASTY Right 08/30/2017   Procedure: RIGHT TOTAL HIP ARTHROPLASTY ANTERIOR APPROACH;  Surgeon: Gaynelle Arabian, MD;  Location: [facility];  Service: Orthopedics;  Laterality: Right;    History reviewed. No pertinent family history. Social History   Socioeconomic History   Marital status: Widowed    Spouse name: Not on file   Number of children: 2   Years of education: Not on file   Highest education level: Not on file  Occupational History   Not on file  Tobacco Use   Smoking status: Former    Types: Cigarettes   Smokeless tobacco: Never   Tobacco comments:    Quit 50 years ago   Vaping Use   Vaping Use: Never used  Substance and Sexual Activity   Alcohol use: Yes    Comment: rare wine   Drug use: No   Sexual activity: Not on file  Other Topics Concern   Not on file  Social History Narrative   Not on file   Social Determinants of Health   Financial Resource Strain: Not on file  Food Insecurity: Not on file  Transportation Needs: Not on file  Physical Activity: Not on file  Stress: Not on file  Social Connections: Not on file    Review of Systems  Constitutional:  Negative for appetite change, fatigue and fever.  HENT:  Negative for congestion, ear pain, sinus pressure and sore throat.   Eyes:  Negative for pain.  Respiratory:  Negative for cough, chest tightness, shortness of breath and wheezing.   Cardiovascular:  Negative for chest pain and palpitations.  Gastrointestinal:  Negative for abdominal pain, constipation, diarrhea, nausea and vomiting.  Genitourinary:  Negative for dysuria and hematuria.  Musculoskeletal:  Negative for arthralgias, back pain, joint swelling and myalgias.  Skin:  Negative for rash.  Neurological:  Negative for dizziness, weakness and headaches.  Psychiatric/Behavioral:  Negative for dysphoric mood. The patient is not  nervous/anxious.     Objective:  BP 136/64   Pulse 67   Temp (!) 96.7 F (35.9 C) (Temporal)   Ht 4\' 9"  (1.448 m)   Wt 110 lb (49.9 kg)   SpO2 100%   BMI 23.80 kg/m   BP/Weight 09/28/2021 09/03/2021 5/63/8756  Systolic BP 433 295 188  Diastolic BP 64 70 67  Wt. (Lbs) 110 108.6 107.9  BMI 23.8 23.5 23.35    Physical Exam Vitals reviewed.  Constitutional:      Appearance: Normal appearance. She is normal weight.  Neck:     Vascular: No carotid bruit.  Cardiovascular:     Rate and Rhythm: Normal rate and regular rhythm.     Pulses: Normal pulses.     Heart sounds: Normal heart sounds.  Pulmonary:     Effort: Pulmonary effort is normal. No respiratory distress.     Breath sounds: Normal breath sounds.  Abdominal:     General: Abdomen is flat. Bowel sounds are normal.     Palpations: Abdomen is soft.     Tenderness: There is no abdominal tenderness.  Neurological:     Mental Status: She is alert and oriented to person, place, and time.  Psychiatric:        Mood and Affect: Mood normal.        Behavior: Behavior normal.    Diabetic Foot Exam - Simple   No data filed      Lab Results  Component Value Date   WBC 6.3 07/12/2021   HGB 13.6 07/12/2021   HCT 41.9 07/12/2021   PLT 218 07/12/2021   GLUCOSE 90 07/12/2021   CHOL 170 06/02/2021   TRIG 140 06/02/2021   HDL 58 06/02/2021   LDLCALC 88 06/02/2021   ALT 20 07/12/2021   AST 20 07/12/2021   NA 139 07/12/2021   K 4.5 07/12/2021   CL 103 07/12/2021   CREATININE 0.63 07/12/2021   BUN 19 07/12/2021   CO2 28 07/12/2021   TSH 1.720 10/05/2020   INR 1.01 08/24/2017      Assessment & Plan:   Problem List Items Addressed This Visit       Cardiovascular and Mediastinum   Primary hypertension    The current medical regimen is effective;  continue present plan and medications. Continue lisinopril 40 mg one twice a day        Musculoskeletal and Integument   Age-related osteoporosis without current  pathological fracture - Primary    Discussed with pt.  Continue calcium carbonate with D bid.  Start on evenity.  Refer to CCM.      Relevant Orders   AMB Referral to [facility]     Other   Mixed hyperlipidemia    The current medical regimen is effective;  continue present plan and medications. Continue pravastatin 40 mg once  daly.  Low fat diet.       Macular degeneration of both eyes    Defer to opthomology.     .  No orders of the defined types were placed in this encounter.   Orders Placed This Encounter  Procedures   AMB Referral to [facility]ordinaton      Follow-up: Return in about 4 weeks (around 10/26/2021) for awv with Shelle Iron, LPN. , chronic fasting with me in 6 months fasting.  An After Visit Summary was printed and given to the patient.   I,Lauren M Auman,acting as a scribe for [name], MD.,have documented all relevant documentation on the behalf of [name], MD,as directed by  [name], MD while in the presence of [name], MD.    [name], MD [place] [phone]

## 2021-09-28 NOTE — Assessment & Plan Note (Signed)
Defer to opthomology.

## 2021-09-28 NOTE — Assessment & Plan Note (Signed)
The current medical regimen is effective;  continue present plan and medications. Continue lisinopril 40 mg one twice a day

## 2021-09-28 NOTE — Assessment & Plan Note (Signed)
Discussed with pt.  Continue calcium carbonate with D bid.  Start on evenity.  Refer to CCM.

## 2021-09-28 NOTE — Assessment & Plan Note (Signed)
The current medical regimen is effective;  continue present plan and medications. Continue pravastatin 40 mg once daly.  Low fat diet.

## 2021-09-29 ENCOUNTER — Telehealth: Payer: Self-pay | Admitting: Family Medicine

## 2021-09-29 NOTE — Progress Notes (Signed)
  Chronic Care Management   Note  09/29/2021 Name: Pamela Price MRN: [id number] DOB: 02/03/1933  Pamela Price is a 87 y.o. year old female who is a primary care patient of Cox, Kirsten, MD. I reached out to [employer] by phone today in response to a referral sent by Pamela Price PCP, Cox, Kirsten, MD.   [name] was given information about Chronic Care Management services today including:  CCM service includes personalized support from designated clinical staff supervised by her physician, including individualized plan of care and coordination with other care providers 24/7 contact phone numbers for assistance for urgent and routine care needs. Service will only be billed when office clinical staff spend 20 minutes or more in a month to coordinate care. Only one practitioner may furnish and bill the service in a calendar month. The patient may stop CCM services at any time (effective at the end of the month) by phone call to the office staff.   Patient agreed to services and verbal consent obtained.   Follow up plan:   [place] Secretary/administrator

## 2021-10-12 DIAGNOSIS — L219 Seborrheic dermatitis, unspecified: Secondary | ICD-10-CM | POA: Diagnosis not present

## 2021-10-12 DIAGNOSIS — L821 Other seborrheic keratosis: Secondary | ICD-10-CM | POA: Diagnosis not present

## 2021-10-13 ENCOUNTER — Telehealth: Payer: Self-pay

## 2021-10-13 NOTE — Chronic Care Management (AMB) (Signed)
Chronic Care Management Pharmacy Assistant   Name: Pamela Price  MRN: [id number] DOB: Nov 23, 1933   Reason for Encounter: Chart Prep for initial visit with CPP on 10/19/21   Conditions to be addressed/monitored: HTN, HLD, and Osteoarthritis, Macular degeneration of both eyes, Osteoporosis    Recent office visits:  09/28/21 Pamela Brome MD. Seen for Osteoporosis. Completed course of Ferrous Sulfate 65 mg daily. Instructed to start on Evenity. Referred to community care coordination.  09/03/21 Pamela Brome MD. Seen for HTN and HLD. Completed course of Senokot 8.6-50 mg and Tramadol 50 mg.   06/02/21 Pamela Belfast NP. Seen for HLD and HTN. No med changes.   Recent consult visits:  08/11/21 (Gynecologic Oncology) Pamela Amber MD. Seen for Ovarian Cyst. No med changes.  07/16/21 (Gynecologic Oncology) Telephone encounter.  Reported to increase Senokot to 2 tabs BID and add a capful of Miralax BID.  06/29/21 (Gynecologic Oncology) Pamela John NP. Seen for Ovarian Cyst. Started on Senokot 8.6-50 mg 2 tablets daily. Started on Tramadol 50 mg every 6 hours prn.  06/08/21 (Gynecologic Oncology)  Pamela Amber MD. Seen for Ovarian Cyst. No med changes.  Hospital visits:  Medication Reconciliation was completed by comparing discharge summary, patient's EMR and Pharmacy list, and upon discussion with patient.  Admitted to the hospital on 07/14/21 due to Left Ovarian Cyst. Discharge date was 07/15/21. Discharged from [facility].  Medications remain the same after Hospital Discharge:??    Medications: Outpatient Encounter Medications as of 10/13/2021  Medication Sig   calcium-vitamin D (OSCAL WITH D) 250-125 MG-UNIT tablet Take 1 tablet by mouth daily. Patient is unsure of mg's   lisinopril (ZESTRIL) 40 MG tablet Take 1 tablet (40 mg total) by mouth 2 (two) times daily.   Multiple Vitamin (MULTIVITAMIN ADULT PO) Take 1 tablet by mouth daily.   Multiple Vitamins-Minerals  (PRESERVISION AREDS 2) CAPS Take 1 capsule by mouth 2 (two) times daily.   pravastatin (PRAVACHOL) 40 MG tablet Take 1 tablet (40 mg total) by mouth at bedtime.   vitamin B-12 (CYANOCOBALAMIN) 500 MCG tablet Take 500 mcg by mouth in the morning and at bedtime.   No facility-administered encounter medications on file as of 10/13/2021.    No results found for: HGBA1C, MICROALBUR   BP Readings from Last 3 Encounters:  09/28/21 136/64  09/03/21 (!) 142/70  08/11/21 (!) 163/67    Patient Questions:   Have you seen any other providers since your last visit with PCP? Pt was seen at Dr. Jimmye Price office for dermatology issues but all checked out well she said.  Any changes in your medications or health? No changes   Any side effects from any medications? No side effects  Do you have an symptoms or problems not managed by your medications? Pt stated none  Any concerns about your health right now? Pt stated she has no concerns right now  Has your provider asked that you check blood pressure, blood sugar, or follow special diet at home? Pt stated she was asked to check her BP at home but she stated she is not good at doing this so she has not been keeping up with it.   Do you get any type of exercise on a regular basis? Pt walks daily and pt does a lot of yard work   Can you think of a goal you would like to reach for your health? Pt has no goal to reach because she feels like she is doing well  Do you have any problems getting your medications? Pt stated no concerns   Is there anything that you would like to discuss during the appointment? Pt wanted to confirm that you were supposed to look at cost of medication for her Bone Density and stated you were going to talk about this in the appt after coming up with some figures for her. She stated this was the main thing she wanted to talk about.   Pamela Price was reminded to have all medications, supplements and any blood glucose and  blood pressure readings available for review with Pamela Price, Pharm. D, at her telephone visit on 10/19/21 at 1:30 pm .    Star Rating Drugs:  Medication:  Last Fill: Day Supply Lisinopril   09/03/21 90ds Pravastatin   07/29/21 90ds  Care Gaps: Last annual wellness visit?Scheduled on 64/6/80 If applicable:N/A Last eye exam / retinopathy screening? Last diabetic foot exam?   Pamela Price, [place] Pharmacist Assistant  [phone]

## 2021-10-19 ENCOUNTER — Other Ambulatory Visit: Payer: Self-pay

## 2021-10-19 ENCOUNTER — Ambulatory Visit: Payer: Medicare HMO

## 2021-10-19 DIAGNOSIS — I10 Essential (primary) hypertension: Secondary | ICD-10-CM

## 2021-10-19 DIAGNOSIS — E782 Mixed hyperlipidemia: Secondary | ICD-10-CM

## 2021-10-19 DIAGNOSIS — M810 Age-related osteoporosis without current pathological fracture: Secondary | ICD-10-CM

## 2021-10-19 NOTE — Progress Notes (Signed)
Chronic Care Management Pharmacy Note  10/19/2021 Name:  Pamela Price MRN:  [id number] DOB:  09/04/1933   Recommendations/Changes made from today's visit: -I was unable to go over medications with patient. Tried to introduce myself multiple times but patient was adamant that all she wanted was the cost and figures of, "my two bone pills." Looked up cost of Evenity per PCP note ($15,181.44/year). Gave her number to call to see if she qualifies for PAP. Unable to look up cost of second medication, will ask PCP for name of second medication so I can get patient the cost breakdown   Subjective: Pamela Price is an 87 y.o. year old female who is a primary patient of Cox, Kirsten, MD.  The CCM team was consulted for assistance with disease management and care coordination needs.    Engaged with patient by telephone for initial visit in response to provider referral for pharmacy case management and/or care coordination services.   Consent to Services:  The patient was given the following information about Chronic Care Management services today, agreed to services, and gave verbal consent: 1. CCM service includes personalized support from designated clinical staff supervised by the primary care provider, including individualized plan of care and coordination with other care providers 2. 24/7 contact phone numbers for assistance for urgent and routine care needs. 3. Service will only be billed when office clinical staff spend 20 minutes or more in a month to coordinate care. 4. Only one practitioner may furnish and bill the service in a calendar month. 5.The patient may stop CCM services at any time (effective at the end of the month) by phone call to the office staff. 6. The patient will be responsible for cost sharing (co-pay) of up to 20% of the service fee (after annual deductible is met). Patient agreed to services and consent obtained.  Patient Care Team: [name], MD as PCP -  General (Family Medicine) Lane Hacker, [facility] as Pharmacist (Pharmacist)  Recent office visits:  09/28/21 [name] MD. Seen for Osteoporosis. Completed course of Ferrous Sulfate 65 mg daily. Instructed to start on Evenity. Referred to community care coordination.   09/03/21 [name] MD. Seen for HTN and HLD. Completed course of Senokot 8.6-50 mg and Tramadol 50 mg.    06/02/21 [name] NP. Seen for HLD and HTN. No med changes.    Recent consult visits:  08/11/21 (Gynecologic Oncology) [name] MD. Seen for Ovarian Cyst. No med changes.   07/16/21 (Gynecologic Oncology) Telephone encounter.  Reported to increase Senokot to 2 tabs BID and add a capful of Miralax BID.   06/29/21 (Gynecologic Oncology) [name] NP. Seen for Ovarian Cyst. Started on Senokot 8.6-50 mg 2 tablets daily. Started on Tramadol 50 mg every 6 hours prn.   06/08/21 (Gynecologic Oncology)  [name] MD. Seen for Ovarian Cyst. No med changes.   Hospital visits:  Medication Reconciliation was completed by comparing discharge summary, patient's EMR and Pharmacy list, and upon discussion with patient.   Admitted to the hospital on 07/14/21 due to Left Ovarian Cyst. Discharge date was 07/15/21. Discharged from [facility].   Medications remain the same after Hospital Discharge:??   Objective:  Lab Results  Component Value Date   CREATININE 0.63 07/12/2021   BUN 19 07/12/2021   GFRNONAA >60 07/12/2021   GFRAA 96 10/05/2020   NA 139 07/12/2021   K 4.5 07/12/2021   CALCIUM 9.6 07/12/2021   CO2 28 07/12/2021  GLUCOSE 90 07/12/2021    No results found for: HGBA1C, FRUCTOSAMINE, GFR, MICROALBUR  Last diabetic Eye exam: No results found for: HMDIABEYEEXA  Last diabetic Foot exam: No results found for: HMDIABFOOTEX   Lab Results  Component Value Date   CHOL 170 06/02/2021   HDL 58 06/02/2021   LDLCALC 88 06/02/2021   TRIG 140 06/02/2021   CHOLHDL 2.9 06/02/2021    Hepatic  Function Latest Ref Rng & Units 07/12/2021 06/02/2021 10/05/2020  Total Protein 6.5 - 8.1 g/dL 7.4 6.8 6.8  Albumin 3.5 - 5.0 g/dL 4.3 4.5 4.5  AST 15 - 41 U/L _0 ALT 0 - 44 U/L _1 Alk Phosphatase 38 - 126 U/L 72 76 85  Total Bilirubin 0.3 - 1.2 mg/dL 0.7 0.4 0.3    Lab Results  Component Value Date/Time   TSH 1.720 10/05/2020 01:33 PM    CBC Latest Ref Rng & Units 07/12/2021 06/02/2021 10/05/2020  WBC 4.0 - 10.5 K/uL 6.3 6.4 5.5  Hemoglobin 12.0 - 15.0 g/dL 13.6 13.6 13.0  Hematocrit 36.0 - 46.0 % 41.9 40.7 40.3  Platelets 150 - 400 K/uL 218 254 228    Lab Results  Component Value Date/Time   VD25OH 38.6 06/02/2021 09:33 AM   VD25OH 39.5 10/27/2020 10:05 AM    Clinical ASCVD: No  The ASCVD Risk score (Arnett DK, et al., 2019) failed to calculate for the following reasons:   The 2019 ASCVD risk score is only valid for ages 42 to 53    Depression screen PHQ 2/9 09/03/2021 10/27/2020  Decreased Interest 0 0  Down, Depressed, Hopeless 0 0  PHQ - 2 Score 0 0     Other: (CHADS2VASc if Afib, MMRC or CAT for COPD, ACT, DEXA)  Social History   Tobacco Use  Smoking Status Former   Types: Cigarettes  Smokeless Tobacco Never  Tobacco Comments   Quit 50 years ago    BP Readings from Last 3 Encounters:  09/28/21 136/64  09/03/21 (!) 142/70  08/11/21 (!) 163/67   Pulse Readings from Last 3 Encounters:  09/28/21 67  09/03/21 84  08/11/21 78   Wt Readings from Last 3 Encounters:  09/28/21 110 lb (49.9 kg)  09/03/21 108 lb 9.6 oz (49.3 kg)  08/11/21 107 lb 14.4 oz (48.9 kg)   BMI Readings from Last 3 Encounters:  09/28/21 23.80 kg/m  09/03/21 23.50 kg/m  08/11/21 23.35 kg/m    Assessment/Interventions: Review of patient past medical history, allergies, medications, health status, including review of consultants reports, laboratory and other test data, was performed as part of comprehensive evaluation and provision of chronic care management services.    SDOH:  (Social Determinants of Health) assessments and interventions performed: Yes SDOH Interventions    Flowsheet Row Most Recent Value  SDOH Interventions   Financial Strain Interventions Other (Comment)  [Gave Phone Number for patient to call for PAP potential]      SDOH Screenings   Alcohol Screen: Low Risk    Last Alcohol Screening Score (AUDIT): 0  Depression (PHQ2-9): Low Risk    PHQ-2 Score: 0  Financial Resource Strain: High Risk   Difficulty of Paying Living Expenses: Very hard  Food Insecurity: Not on file  Housing: Not on file  Physical Activity: Not on file  Social Connections: Not on file  Stress: Not on file  Tobacco Use: Medium Risk   Smoking Tobacco Use: Former   Smokeless Tobacco Use: Never   Passive Exposure:  Not on file  Transportation Needs: Not on file    [place]  Allergies  Allergen Reactions   Sulfa Antibiotics Rash    Broke out in a rash 40 years ago  Broke out in a rash 40 years ago     Medications Reviewed Today     Reviewed by Lane Hacker, [facility] (Pharmacist) on 10/19/21 at 1353  Med List Status: <None>   Medication Order Taking? Sig Documenting Provider Last Dose Status Informant  Calcium Carbonate-Vitamin D 600-10 MG-MCG TABS [id number] Yes Take 1 tablet by mouth daily. 646m/20mcg [provider] Taking Active Self  lisinopril (ZESTRIL) 40 MG tablet [id number] Take 1 tablet (40 mg total) by mouth 2 (two) times daily. Cox, Kirsten, MD Taking Active   Multiple Vitamin (MULTIVITAMIN ADULT PO) [id number] Take 1 tablet by mouth daily. [provider] Taking Active Self  Multiple Vitamins-Minerals (PRESERVISION AREDS 2) CAPS [id number] Take 1 capsule by mouth 2 (two) times daily. [provider] Taking Active Self  pravastatin (PRAVACHOL) 40 MG tablet [id number] Take 1 tablet (40 mg total) by mouth at bedtime. Cox, Kirsten, MD Taking Active   vitamin B-12 (CYANOCOBALAMIN) 500 MCG tablet  [id number] Take 500 mcg by mouth in the morning and at bedtime. [provider] Taking Active Self            Patient Active Problem List   Diagnosis Date Noted   Macular degeneration of both eyes 09/28/2021   Age-related osteoporosis without current pathological fracture 09/28/2021   Primary hypertension 09/03/2021   Mixed hyperlipidemia 09/03/2021   OA (osteoarthritis) of hip 08/30/2017    Immunization History  Administered Date(s) Administered   Fluad Quad(high Dose 65+) 10/05/2020, 09/03/2021   Moderna Covid-19 Vaccine Bivalent Booster 168yr& up 09/28/2021   Moderna SARS-COV2 Booster Vaccination 11/10/2020   Moderna Sars-Covid-2 Vaccination 01/23/2020, 02/17/2020, 05/19/2021   Pneumococcal Conjugate-13 03/30/2016   Pneumococcal Polysaccharide-23 09/18/2012    Conditions to be addressed/monitored:  Hypertension, Hyperlipidemia, and Osteoporosis  Care Plan : CCSanta PaulaUpdates made by KeLane HackerRPTangelo Parkince 10/19/2021 12:00 AM     Problem: Osteo, HTN, Lipids   Priority: High  Onset Date: 10/19/2021     Goal: Disease State Management   Start Date: 10/19/2021  Expected End Date: 10/19/2022  This Visit's Progress: On track  Priority: High  Note:   Current Barriers:  Unable to independently afford treatment regimen  Pharmacist Clinical Goal(s):  Patient will contact provider office for questions/concerns as evidenced notation of same in electronic health record through collaboration with PharmD and provider.   Interventions: 1:1 collaboration with CoRochel BromeMD regarding development and update of comprehensive plan of care as evidenced by provider attestation and co-signature Inter-disciplinary care team collaboration (see longitudinal plan of care) Comprehensive medication review performed; medication list updated in electronic medical record  Hypertension (BP goal <140/90) -Controlled -Current treatment: Lisinopril  4029mMedications previously tried: N/A  -Current home readings: N/A -Current dietary habits: N/A -Current exercise habits: N/A -Denies hypotensive/hypertensive symptoms November 2022: Unable to go over meds or introduce myself to patient. When I called she stated she didn't understand why I had to talk about her other meds, all she needed was the cost of the "two bone medicines."  Hyperlipidemia: (LDL goal < 100) -Controlled -Current treatment: Pravastatin 80m33medications previously tried: N/A  -Current dietary patterns: N/A -Current exercise habits: N/A November 2022: Unable to go over meds or introduce myself to  patient. When I called she stated she didn't understand why I had to talk about her other meds, all she needed was the cost of the "two bone medicines."   Osteoporosis / Osteopenia -Not ideally controlled -Current treatment  Calcium/Vit D 600/53mg -Medications previously tried: Alendronate (Dc'd early 2021)  November 2022: Unable to go over meds or introduce myself. Patient emphasized all she wanted was cost of her "two bone meds" that Dr. CTobie Poetwanted to prescribe. I am unsure what the second med is, the only one I saw on the note was Evenity. Per [employer] PPO Estimated drug costs (based on 1 drug)  $1,265.12/month            $1,265.12  x   12 months  = $15,181.44 Gave her phone number to [phone] ([phone]) to call to see if she qualifies for PAP. Patient was not interested in me calling, only wanted cost and figures. Will ask PCP what other medication was. Patient affirmed I could leave VM with cost of medication. Emphasized to patient that phone call will not be today as Dr. CTobie Poethas to let me know the name and I then have to research it.      Patient Goals/Self-Care Activities Patient will:  - take medications as prescribed as evidenced by patient report and record review  Follow Up Plan: No further follow up required: after I contact her regarding second  medication   NArizona Constable Pharm.D. - [phone]       Medication Assistance:  GMcVeytownpatient phone number to call for potential Patient Assistance  Compliance/Adherence/Medication fill history: Star Rating Drugs:  Medication:                Last Fill:         Day Supply Lisinopril                      09/03/21            90ds Pravastatin                  07/29/21            90ds   Care Gaps: Last annual wellness visit?Scheduled on 109/8/11If applicable:N/A Last eye exam / retinopathy screening? Last diabetic foot exam?    Patient's [facility] pharmacy [facility]  [id number] [place], [place]: [phone]: [phone] [place] [facility] [place]one: [phone]: [address]hone: [phone]: [phone] Plan and Follow Up Patient Decision:  Patient requests no follow-up at this time. -Per CMA, Danielle, during phone call with patient the patient stated, "I only want him to talk about my bone medication" and "I was told he'd have all the figures laid out for me" -When patient spoke to assistant, patient was frustrated about the assistant asking questions about all her medications. When I spoke with the patient, this was the first thing she told me. I apologized and tried to explain why the assistant does that, "That some medications for one disease can interact with another disease. And it's best to make sure we've got everything organized so we can  take care of you best." Patient had refused to go over things with the assistant and during the course of my discussion with the patient, I was able to  determine that the Calcium strength she was taking per Epic was 1/3rd the strength of what she's actually taking, updated this on her medslist. Tried to explain again that this is why the assistant asks these questions. She told me she still didn't understand why.  -I apologized and said, "Please forgive my assistant, she does that because I ask her. It's not her fault. I'm sorry that it caused [place] to get off on the wrong foot, would you mind if I introduced myself and explained who I am, what I do, and how I can best take care of you." Patient told me she was not interested in this, didn't see the point, and only wanted me to tell her "how much my two bone pills are going to cost." Gave her the cost and the phone number she could call to see if she qualifies for PAP. Normally, this is something the CPP and the team would take care of but patient wasn't interested in the service and every time I started trying to introduce myself and what my team did, she replied, "I don't understand why we have to do this. All I want is the cost." I tried asking her if she was busy and wanted to reschedule to a different day if she was in a rush, she told me she didn't wish to reschedule and wasn't busy at all, just that she wasn't interested in the service. -Will coordinate with PCP to determine name of other medication, will look up cost on [employer], and patient gave verbal approval for me to leave a VM with the information and to close CCM services after that time  Plan: No further follow up required: after contact with secondary medication information  [name], Pharm.D. - [phone]

## 2021-10-19 NOTE — Patient Instructions (Signed)
Visit Information   Goals Addressed             This Visit's Progress    Manage My Medicine       Timeframe:  Long-Range Goal Priority:  High Start Date:                             Expected End Date:                       Follow Up Date 10/19/22   - keep a list of all the medicines I take; vitamins and herbals too    Why is this important?   These steps will help you keep on track with your medicines.   Notes:      Track and Manage My Blood Pressure-Hypertension       Timeframe:  Long-Range Goal Priority:  High Start Date:                             Expected End Date:                       Follow Up Date 10/19/22   - choose a place to take my blood pressure (home, clinic or office, retail store) - write blood pressure results in a log or diary    Why is this important?   You won't feel high blood pressure, but it can still hurt your blood vessels.  High blood pressure can cause heart or kidney problems. It can also cause a stroke.  Making lifestyle changes like losing a little weight or eating less salt will help.  Checking your blood pressure at home and at different times of the day can help to control blood pressure.  If the doctor prescribes medicine remember to take it the way the doctor ordered.  Call the office if you cannot afford the medicine or if there are questions about it.     Notes:        Patient Care Plan: CCM Pharmacy Care Plan     Problem Identified: Osteo, HTN, Lipids   Priority: High  Onset Date: 10/19/2021     Goal: Disease State Management   Start Date: 10/19/2021  Expected End Date: 10/19/2022  This Visit's Progress: On track  Priority: High  Note:   Current Barriers:  Unable to independently afford treatment regimen  Pharmacist Clinical Goal(s):  Patient will contact provider office for questions/concerns as evidenced notation of same in electronic health record through collaboration with PharmD and provider.    Interventions: 1:1 collaboration with [name], MD regarding development and update of comprehensive plan of care as evidenced by provider attestation and co-signature Inter-disciplinary care team collaboration (see longitudinal plan of care) Comprehensive medication review performed; medication list updated in electronic medical record  Hypertension (BP goal <140/90) -Controlled -Current treatment: Lisinopril 40mg  -Medications previously tried: N/A  -Current home readings: N/A -Current dietary habits: N/A -Current exercise habits: N/A -Denies hypotensive/hypertensive symptoms November 2022: Unable to go over meds or introduce myself to patient. When I called she stated she didn't understand why I had to talk about her other meds, all she needed was the cost of the "two bone medicines."  Hyperlipidemia: (LDL goal < 100) -Controlled -Current treatment: Pravastatin 40mg  -Medications previously tried: N/A  -Current dietary patterns: N/A -Current exercise habits: N/A November 2022: Unable  to go over meds or introduce myself to patient. When I called she stated she didn't understand why I had to talk about her other meds, all she needed was the cost of the "two bone medicines."   Osteoporosis / Osteopenia -Not ideally controlled -Current treatment  Calcium/Vit D 600/41mcg -Medications previously tried: Alendronate (Dc'd early 2021)  November 2022: Unable to go over meds or introduce myself. Patient emphasized all she wanted was cost of her "two bone meds" that Dr. Tobie Poet wanted to prescribe. I am unsure what the second med is, the only one I saw on the note was Evenity. Per [employer] PPO Estimated drug costs (based on 1 drug)  $1,265.12/month            $1,265.12  x   12 months  = $15,181.44 Gave her phone number to [phone] ([phone]) to call to see if she qualifies for PAP. Patient was not interested in me calling, only wanted cost and figures. Will ask PCP what other  medication was. Patient affirmed I could leave VM with cost of medication. Emphasized to patient that phone call will not be today as Dr. Tobie Poet has to let me know the name and I then have to research it.      Patient Goals/Self-Care Activities Patient will:  - take medications as prescribed as evidenced by patient report and record review  Follow Up Plan: No further follow up required: after I contact her regarding second medication   [name], Pharm.D. - [phone]      Pamela Price was given information about Chronic Care Management services today including:  CCM service includes personalized support from designated clinical staff supervised by her physician, including individualized plan of care and coordination with other care providers 24/7 contact phone numbers for assistance for urgent and routine care needs. Standard insurance, coinsurance, copays and deductibles apply for chronic care management only during months in which we provide at least 20 minutes of these services. Most insurances cover these services at 100%, however patients may be responsible for any copay, coinsurance and/or deductible if applicable. This service may help you avoid the need for more expensive face-to-face services. Only one practitioner may furnish and bill the service in a calendar month. The patient may stop CCM services at any time (effective at the end of the month) by phone call to the office staff.  Patient agreed to services and verbal consent obtained.   The patient verbalized understanding of instructions, educational materials, and care plan provided today and declined offer to receive copy of patient instructions, educational materials, and care plan.  -Patient not interested in f/u after this visit  Lane Hacker, [facility]

## 2021-11-02 ENCOUNTER — Telehealth: Payer: Self-pay

## 2021-11-02 NOTE — Telephone Encounter (Addendum)
I called to discuss treatment of her osteoporosis.  Prolia coverage with her [facility] Advantage would be $295 out of pocket every 6 months. Evenity monthly injections would be $415 out of pocket for 12 months.   [employer] patient assistance currently has none available.   She is going to consider her options and call us back.

## 2021-11-02 NOTE — Telephone Encounter (Signed)
I called the pt this morning to get her appointment RS with Maudie Mercury. Appointment has been RS. However, the pt is upset that no one has contacted her regarding the medication for her Osteoporosis. Pt stated she was told that Dr. Cox's husband would look into this. She has never heard anything. She is wanting to know the cost. I see where Ovid Curd spoke with her on 10/19/2021 stating the following,"I was unable to go over medications with patient. Tried to introduce myself multiple times but patient was adamant that all she wanted was the cost and figures of, "my two bone pills." Looked up cost of Evenity per PCP note ($15,181.44/year)."

## 2021-11-11 ENCOUNTER — Other Ambulatory Visit: Payer: Self-pay

## 2021-11-11 ENCOUNTER — Ambulatory Visit (INDEPENDENT_AMBULATORY_CARE_PROVIDER_SITE_OTHER): Payer: Medicare HMO

## 2021-11-11 DIAGNOSIS — M810 Age-related osteoporosis without current pathological fracture: Secondary | ICD-10-CM

## 2021-11-11 MED ORDER — DENOSUMAB 60 MG/ML ~~LOC~~ SOSY
60.0000 mg | PREFILLED_SYRINGE | Freq: Once | SUBCUTANEOUS | Status: AC
  Administered 2021-11-11: 60 mg via SUBCUTANEOUS

## 2021-11-11 NOTE — Progress Notes (Signed)
Pamela Price comes in for a new start of Prolia for her osteoporosis.

## 2021-12-01 DIAGNOSIS — L650 Telogen effluvium: Secondary | ICD-10-CM | POA: Diagnosis not present

## 2021-12-01 DIAGNOSIS — L219 Seborrheic dermatitis, unspecified: Secondary | ICD-10-CM | POA: Diagnosis not present

## 2021-12-16 DIAGNOSIS — H353231 Exudative age-related macular degeneration, bilateral, with active choroidal neovascularization: Secondary | ICD-10-CM | POA: Diagnosis not present

## 2022-02-05 ENCOUNTER — Other Ambulatory Visit: Payer: Self-pay | Admitting: Family Medicine

## 2022-02-05 DIAGNOSIS — E782 Mixed hyperlipidemia: Secondary | ICD-10-CM

## 2022-02-07 NOTE — Telephone Encounter (Signed)
Refill sent to pharmacy.   

## 2022-03-04 ENCOUNTER — Ambulatory Visit: Payer: Medicare HMO | Admitting: Family Medicine

## 2022-03-09 DIAGNOSIS — L820 Inflamed seborrheic keratosis: Secondary | ICD-10-CM | POA: Diagnosis not present

## 2022-03-09 DIAGNOSIS — L650 Telogen effluvium: Secondary | ICD-10-CM | POA: Diagnosis not present

## 2022-03-10 ENCOUNTER — Ambulatory Visit (INDEPENDENT_AMBULATORY_CARE_PROVIDER_SITE_OTHER): Payer: Medicare HMO | Admitting: Family Medicine

## 2022-03-10 ENCOUNTER — Encounter: Payer: Self-pay | Admitting: Family Medicine

## 2022-03-10 VITALS — BP 142/68 | HR 60 | Resp 16 | Ht 60.0 in | Wt 109.0 lb

## 2022-03-10 DIAGNOSIS — M810 Age-related osteoporosis without current pathological fracture: Secondary | ICD-10-CM

## 2022-03-10 DIAGNOSIS — I10 Essential (primary) hypertension: Secondary | ICD-10-CM | POA: Diagnosis not present

## 2022-03-10 DIAGNOSIS — E782 Mixed hyperlipidemia: Secondary | ICD-10-CM

## 2022-03-10 DIAGNOSIS — H3530 Unspecified macular degeneration: Secondary | ICD-10-CM

## 2022-03-10 DIAGNOSIS — H353231 Exudative age-related macular degeneration, bilateral, with active choroidal neovascularization: Secondary | ICD-10-CM | POA: Diagnosis not present

## 2022-03-10 NOTE — Progress Notes (Signed)
? ?Subjective:  ?Patient ID: Pamela Price, female    DOB: 1933-11-20  Age: 88 y.o. MRN: [id number] ? ?Chief Complaint  ?Patient presents with  ? Hyperlipidemia  ? Hypertension  ? ? ?HPI ?Patient is an 88 yo female with hypertension and hyperlipidemia. On pravastatin 40 mg once daily and lisinopril 40 mg twice daily. Patient has white coat hypertension and usually comes back down after sitting for few minutes. Denies symptoms. Patient has macular degeneration and is getting injections.  ?Patient has osteoporosis and is on prolia. On calcium carbonate with vitamin D 600/10 2 daily.  ?  ?Current Outpatient Medications on File Prior to Visit  ?Medication Sig Dispense Refill  ? Calcium Carbonate-Vitamin D 600-10 MG-MCG TABS Take 1 tablet by mouth daily. '600mg'$ /25mg    ? lisinopril (ZESTRIL) 40 MG tablet Take 1 tablet (40 mg total) by mouth 2 (two) times daily. 180 tablet 2  ? Multiple Vitamin (MULTIVITAMIN ADULT PO) Take 1 tablet by mouth daily.    ? Multiple Vitamins-Minerals (PRESERVISION AREDS 2) CAPS Take 1 capsule by mouth 2 (two) times daily.    ? pravastatin (PRAVACHOL) 40 MG tablet TAKE 1 TABLET AT BEDTIME 90 tablet 1  ? ?No current facility-administered medications on file prior to visit.  ? ?Past Medical History:  ?Diagnosis Date  ? Adnexal mass 06/30/2021  ? Arthritis   ? History of right hemicolectomy   ? HLD (hyperlipidemia) 06/30/2021  ? Hypertension 06/30/2021  ? Left ovarian cyst 07/22/2019  ? Macular degeneration 06/30/2021  ? bilateral  gets eye injections  ? Osteoporosis 06/30/2021  ? Varicose veins of both lower extremities 06/30/2021  ? right leg only  ? ?Past Surgical History:  ?Procedure Laterality Date  ? BREAST LUMPECTOMY W/ NEEDLE LOCALIZATION    ? axillary tail of the left  breast   ? COLON SURGERY  02/2019  ? for stage I colon cancer  ? colonscopy  2020  ? ROBOTIC ASSISTED BILATERAL SALPINGO OOPHERECTOMY N/A 07/14/2021  ? ROBOTIC ASSISTED TOTAL HYSTERECTOMY WITH BILATERAL SALPINGO  OOPHERECTOMY  07/14/2021  ? TOTAL HIP ARTHROPLASTY Right 08/30/2017  ? Procedure: RIGHT TOTAL HIP ARTHROPLASTY ANTERIOR APPROACH;  Surgeon: AGaynelle Arabian MD;  Location: [facility];  Service: Orthopedics;  Laterality: Right;  ?  ?History reviewed. No pertinent family history. ?Social History  ? ?Socioeconomic History  ? Marital status: Widowed  ?  Spouse name: Not on file  ? Number of children: 2  ? Years of education: Not on file  ? Highest education level: Not on file  ?Occupational History  ? Not on file  ?Tobacco Use  ? Smoking status: Former  ?  Types: Cigarettes  ? Smokeless tobacco: Never  ? Tobacco comments:  ?  Quit 50 years ago   ?Vaping Use  ? Vaping Use: Never used  ?Substance and Sexual Activity  ? Alcohol use: Yes  ?  Comment: rare wine  ? Drug use: No  ? Sexual activity: Not on file  ?Other Topics Concern  ? Not on file  ?Social History Narrative  ? Not on file  ? ?Social Determinants of Health  ? ?Financial Resource Strain: High Risk  ? Difficulty of Paying Living Expenses: Very hard  ?Food Insecurity: Not on file  ?Transportation Needs: Not on file  ?Physical Activity: Not on file  ?Stress: Not on file  ?Social Connections: Not on file  ? ? ?Review of Systems  ?Constitutional:  Negative for chills, fatigue and fever.  ?HENT:  Positive for sinus pressure.  Negative for congestion, rhinorrhea and sore throat.   ?Respiratory:  Negative for cough and shortness of breath.   ?Cardiovascular:  Negative for chest pain.  ?Gastrointestinal:  Negative for abdominal pain, constipation, diarrhea, nausea and vomiting.  ?Genitourinary:  Positive for urgency. Negative for dysuria.  ?Musculoskeletal:  Negative for back pain and myalgias.  ?Neurological:  Negative for dizziness, weakness, light-headedness and headaches.  ?Psychiatric/Behavioral:  Negative for dysphoric mood. The patient is not nervous/anxious.   ? ? ?Objective:  ?BP (!) 142/68   Pulse 60   Resp 16   Ht 5' (1.524 m)   Wt 109 lb (49.4 kg)   BMI 21.29  kg/m?  ? ? ?  03/10/2022  ? 10:16 AM 09/28/2021  ?  3:01 PM 09/28/2021  ?  2:23 PM  ?BP/Weight  ?Systolic BP 253 664 403  ?Diastolic BP 68 64 68  ?Wt. (Lbs) 109  110  ?BMI 21.29 kg/m2  23.8 kg/m2  ? ? ?Physical Exam ?Vitals reviewed.  ?Constitutional:   ?   Appearance: Normal appearance.  ?HENT:  ?   Right Ear: Tympanic membrane, ear canal and external ear normal.  ?   Left Ear: Tympanic membrane, ear canal and external ear normal.  ?   Nose: Nose normal.  ?   Mouth/Throat:  ?   Pharynx: Oropharynx is clear.  ?Cardiovascular:  ?   Rate and Rhythm: Normal rate and regular rhythm.  ?   Heart sounds: Normal heart sounds. No murmur heard. ?Pulmonary:  ?   Effort: Pulmonary effort is normal. No respiratory distress.  ?   Breath sounds: Normal breath sounds.  ?Lymphadenopathy:  ?   Cervical: No cervical adenopathy.  ?Neurological:  ?   Mental Status: She is alert and oriented to person, place, and time.  ?Psychiatric:     ?   Mood and Affect: Mood normal.     ?   Behavior: Behavior normal.  ? ? ?Diabetic Foot Exam - Simple   ?No data filed ?  ?  ? ?Lab Results  ?Component Value Date  ? WBC 6.7 03/10/2022  ? HGB 14.0 03/10/2022  ? HCT 41.4 03/10/2022  ? PLT 256 03/10/2022  ? GLUCOSE 94 03/10/2022  ? CHOL 176 03/10/2022  ? TRIG 166 (H) 03/10/2022  ? HDL 59 03/10/2022  ? [place] 89 03/10/2022  ? ALT 17 03/10/2022  ? AST 21 03/10/2022  ? NA 141 03/10/2022  ? K 5.0 03/10/2022  ? CL 103 03/10/2022  ? CREATININE 0.67 03/10/2022  ? BUN 15 03/10/2022  ? CO2 26 03/10/2022  ? TSH 1.720 10/05/2020  ? INR 1.01 08/24/2017  ? ? ? ? ?Assessment & Plan:  ? ?Problem List Items Addressed This Visit   ? ?  ? Cardiovascular and Mediastinum  ? Primary hypertension  ?  Well controlled.  ?No changes to medicines. lisinopril 40 mg twice daily. Patient has white coat hypertension also. ?Continue to work on eating a healthy diet and exercise.  ?Labs drawn today.  ? ?  ?  ? Relevant Orders  ? CBC with Differential/Platelet (Completed)  ?  Comprehensive metabolic panel (Completed)  ?  ? Musculoskeletal and Integument  ? Age-related osteoporosis without current pathological fracture  ?  Message sent to Shelle Iron, LPN to work on benefits verification for prolia. Continue calcium carbonate with D 2 daily. ?  ?  ?  ? Other  ? Mixed hyperlipidemia - Primary  ?  Well controlled.  ?No changes to medicines. Continue pravastatin  40 mg daily. ?Continue to work on eating a healthy diet and exercise.  ?Labs drawn today.  ? ?  ?  ? Relevant Orders  ? Lipid panel (Completed)  ? Macular degeneration of both eyes  ?  Follow up regularly with opthomology.  ?  ?  ?. ? ?No orders of the defined types were placed in this encounter. ? ? ?Orders Placed This Encounter  ?Procedures  ? CBC with Differential/Platelet  ? Comprehensive metabolic panel  ? Lipid panel  ?  ? ?Follow-up: Return in about 6 months (around 09/10/2022) for chronic fasting. ? ?An After Visit Summary was printed and given to the patient. ? ?[name], MD ?[place] ?[phone] ?

## 2022-03-10 NOTE — Patient Instructions (Signed)
Recommend Shingrix series x 2.  ?

## 2022-03-11 LAB — CBC WITH DIFFERENTIAL/PLATELET
Basophils Absolute: 0.1 10*3/uL (ref 0.0–0.2)
Basos: 1 %
EOS (ABSOLUTE): 0.1 10*3/uL (ref 0.0–0.4)
Eos: 2 %
Hematocrit: 41.4 % (ref 34.0–46.6)
Hemoglobin: 14 g/dL (ref 11.1–15.9)
Immature Grans (Abs): 0 10*3/uL (ref 0.0–0.1)
Immature Granulocytes: 0 %
Lymphocytes Absolute: 1.4 10*3/uL (ref 0.7–3.1)
Lymphs: 21 %
MCH: 29.2 pg (ref 26.6–33.0)
MCHC: 33.8 g/dL (ref 31.5–35.7)
MCV: 86 fL (ref 79–97)
Monocytes Absolute: 0.5 10*3/uL (ref 0.1–0.9)
Monocytes: 8 %
Neutrophils Absolute: 4.5 10*3/uL (ref 1.4–7.0)
Neutrophils: 68 %
Platelets: 256 10*3/uL (ref 150–450)
RBC: 4.8 x10E6/uL (ref 3.77–5.28)
RDW: 13 % (ref 11.7–15.4)
WBC: 6.7 10*3/uL (ref 3.4–10.8)

## 2022-03-11 LAB — LIPID PANEL
Chol/HDL Ratio: 3 ratio (ref 0.0–4.4)
Cholesterol, Total: 176 mg/dL (ref 100–199)
HDL: 59 mg/dL
LDL Chol Calc (NIH): 89 mg/dL (ref 0–99)
Triglycerides: 166 mg/dL — ABNORMAL HIGH (ref 0–149)
VLDL Cholesterol Cal: 28 mg/dL (ref 5–40)

## 2022-03-11 LAB — COMPREHENSIVE METABOLIC PANEL WITH GFR
ALT: 17 [IU]/L (ref 0–32)
AST: 21 [IU]/L (ref 0–40)
Albumin/Globulin Ratio: 1.8 (ref 1.2–2.2)
Albumin: 4.4 g/dL (ref 3.6–4.6)
Alkaline Phosphatase: 64 [IU]/L (ref 44–121)
BUN/Creatinine Ratio: 22 (ref 12–28)
BUN: 15 mg/dL (ref 8–27)
Bilirubin Total: 0.4 mg/dL (ref 0.0–1.2)
CO2: 26 mmol/L (ref 20–29)
Calcium: 9.8 mg/dL (ref 8.7–10.3)
Chloride: 103 mmol/L (ref 96–106)
Creatinine, Ser: 0.67 mg/dL (ref 0.57–1.00)
Globulin, Total: 2.5 g/dL (ref 1.5–4.5)
Glucose: 94 mg/dL (ref 70–99)
Potassium: 5 mmol/L (ref 3.5–5.2)
Sodium: 141 mmol/L (ref 134–144)
Total Protein: 6.9 g/dL (ref 6.0–8.5)
eGFR: 85 mL/min/{1.73_m2}

## 2022-03-13 ENCOUNTER — Encounter: Payer: Self-pay | Admitting: Family Medicine

## 2022-03-13 NOTE — Assessment & Plan Note (Signed)
Well controlled.  No changes to medicines. Continue pravastatin 40 mg daily.  Continue to work on eating a healthy diet and exercise.  Labs drawn today.   

## 2022-03-13 NOTE — Assessment & Plan Note (Signed)
Message sent to Shelle Iron, LPN to work on benefits verification for prolia. Continue calcium carbonate with D 2 daily. ?

## 2022-03-13 NOTE — Assessment & Plan Note (Signed)
Well controlled.  ?No changes to medicines. lisinopril 40 mg twice daily. Patient has white coat hypertension also. ?Continue to work on eating a healthy diet and exercise.  ?Labs drawn today.  ? ?

## 2022-03-13 NOTE — Assessment & Plan Note (Signed)
Follow up regularly with opthomology.  ?

## 2022-04-20 ENCOUNTER — Telehealth: Payer: Self-pay

## 2022-04-20 NOTE — Chronic Care Management (AMB) (Signed)
Chronic Care Management Pharmacy Assistant   Name: Pamela Price  MRN: [id number] DOB: 11/23/1933  Reason for Encounter: Prolia Benefit Verification   Patient last Prolia injection was on 11/11/2021, patient due for next injection on 05/13/2022. Benefit verification submitted through [employer] Portal, awaiting coverage determination.   Coverage details: Physician Purchase available, Prior authorization required. Prolia is subject to 20.0% coinsurance and $4,500.00 out of pocket max ($1,616.06 met). Once met, Prolia will then be covered at 100%. The benefits provided on this Verification of Benefits form are Medical Benefits and are the patient's In-Network benefits for Prolia.  Precertification is required. Call [phone] or complete the Precertification form available at [email or web address].  Filled out prior authorization form with [employer] Medicare, sending to clinical team for Dr Cox to sign and fax.    Medications: Outpatient Encounter Medications as of 04/20/2022  Medication Sig   Calcium Carbonate-Vitamin D 600-10 MG-MCG TABS Take 1 tablet by mouth daily. 600mg/20mcg   lisinopril (ZESTRIL) 40 MG tablet Take 1 tablet (40 mg total) by mouth 2 (two) times daily.   Multiple Vitamin (MULTIVITAMIN ADULT PO) Take 1 tablet by mouth daily.   Multiple Vitamins-Minerals (PRESERVISION AREDS 2) CAPS Take 1 capsule by mouth 2 (two) times daily.   pravastatin (PRAVACHOL) 40 MG tablet TAKE 1 TABLET AT BEDTIME   No facility-administered encounter medications on file as of 04/20/2022.    Tamala Melvin, CMA Clinical Pharmacist Assistant [phone]  

## 2022-05-12 ENCOUNTER — Telehealth: Payer: Self-pay

## 2022-05-12 NOTE — Chronic Care Management (AMB) (Signed)
Faxed prior authorization from to [facility] for [place].  Pattricia Boss, [place] Pharmacist Assistant [phone]

## 2022-05-16 ENCOUNTER — Ambulatory Visit (INDEPENDENT_AMBULATORY_CARE_PROVIDER_SITE_OTHER): Payer: Medicare HMO

## 2022-05-16 DIAGNOSIS — M810 Age-related osteoporosis without current pathological fracture: Secondary | ICD-10-CM | POA: Diagnosis not present

## 2022-05-18 DIAGNOSIS — M810 Age-related osteoporosis without current pathological fracture: Secondary | ICD-10-CM | POA: Diagnosis not present

## 2022-05-18 MED ORDER — DENOSUMAB 60 MG/ML ~~LOC~~ SOSY
60.0000 mg | PREFILLED_SYRINGE | Freq: Once | SUBCUTANEOUS | Status: AC
  Administered 2022-05-18: 60 mg via SUBCUTANEOUS

## 2022-06-02 ENCOUNTER — Other Ambulatory Visit: Payer: Self-pay | Admitting: Family Medicine

## 2022-06-02 DIAGNOSIS — I10 Essential (primary) hypertension: Secondary | ICD-10-CM

## 2022-06-07 ENCOUNTER — Other Ambulatory Visit: Payer: Self-pay | Admitting: Family Medicine

## 2022-06-07 DIAGNOSIS — E782 Mixed hyperlipidemia: Secondary | ICD-10-CM

## 2022-06-09 DIAGNOSIS — H353231 Exudative age-related macular degeneration, bilateral, with active choroidal neovascularization: Secondary | ICD-10-CM | POA: Diagnosis not present

## 2022-08-09 DIAGNOSIS — L659 Nonscarring hair loss, unspecified: Secondary | ICD-10-CM | POA: Diagnosis not present

## 2022-08-09 DIAGNOSIS — M810 Age-related osteoporosis without current pathological fracture: Secondary | ICD-10-CM | POA: Diagnosis not present

## 2022-08-09 DIAGNOSIS — M1990 Unspecified osteoarthritis, unspecified site: Secondary | ICD-10-CM | POA: Diagnosis not present

## 2022-08-09 DIAGNOSIS — E785 Hyperlipidemia, unspecified: Secondary | ICD-10-CM | POA: Diagnosis not present

## 2022-08-09 DIAGNOSIS — Z809 Family history of malignant neoplasm, unspecified: Secondary | ICD-10-CM | POA: Diagnosis not present

## 2022-08-09 DIAGNOSIS — H3530 Unspecified macular degeneration: Secondary | ICD-10-CM | POA: Diagnosis not present

## 2022-08-09 DIAGNOSIS — I951 Orthostatic hypotension: Secondary | ICD-10-CM | POA: Diagnosis not present

## 2022-08-09 DIAGNOSIS — Z87891 Personal history of nicotine dependence: Secondary | ICD-10-CM | POA: Diagnosis not present

## 2022-08-09 DIAGNOSIS — I739 Peripheral vascular disease, unspecified: Secondary | ICD-10-CM | POA: Diagnosis not present

## 2022-08-09 DIAGNOSIS — Z008 Encounter for other general examination: Secondary | ICD-10-CM | POA: Diagnosis not present

## 2022-08-09 DIAGNOSIS — I10 Essential (primary) hypertension: Secondary | ICD-10-CM | POA: Diagnosis not present

## 2022-08-28 ENCOUNTER — Other Ambulatory Visit: Payer: Self-pay | Admitting: Physician Assistant

## 2022-08-28 DIAGNOSIS — I10 Essential (primary) hypertension: Secondary | ICD-10-CM

## 2022-09-01 DIAGNOSIS — H353231 Exudative age-related macular degeneration, bilateral, with active choroidal neovascularization: Secondary | ICD-10-CM | POA: Diagnosis not present

## 2022-09-01 DIAGNOSIS — H04123 Dry eye syndrome of bilateral lacrimal glands: Secondary | ICD-10-CM | POA: Diagnosis not present

## 2022-09-09 ENCOUNTER — Ambulatory Visit (INDEPENDENT_AMBULATORY_CARE_PROVIDER_SITE_OTHER): Payer: Medicare HMO | Admitting: Family Medicine

## 2022-09-09 ENCOUNTER — Encounter: Payer: Self-pay | Admitting: Family Medicine

## 2022-09-09 VITALS — BP 140/70 | HR 62 | Temp 97.5°F | Resp 14 | Ht 60.0 in | Wt 111.0 lb

## 2022-09-09 DIAGNOSIS — I10 Essential (primary) hypertension: Secondary | ICD-10-CM

## 2022-09-09 DIAGNOSIS — Z1231 Encounter for screening mammogram for malignant neoplasm of breast: Secondary | ICD-10-CM | POA: Insufficient documentation

## 2022-09-09 DIAGNOSIS — H918X9 Other specified hearing loss, unspecified ear: Secondary | ICD-10-CM

## 2022-09-09 DIAGNOSIS — E782 Mixed hyperlipidemia: Secondary | ICD-10-CM

## 2022-09-09 DIAGNOSIS — M810 Age-related osteoporosis without current pathological fracture: Secondary | ICD-10-CM

## 2022-09-09 DIAGNOSIS — Z23 Encounter for immunization: Secondary | ICD-10-CM | POA: Diagnosis not present

## 2022-09-09 DIAGNOSIS — Z0000 Encounter for general adult medical examination without abnormal findings: Secondary | ICD-10-CM | POA: Diagnosis not present

## 2022-09-09 DIAGNOSIS — H9190 Unspecified hearing loss, unspecified ear: Secondary | ICD-10-CM | POA: Insufficient documentation

## 2022-09-09 NOTE — Assessment & Plan Note (Signed)
  Some hearing loss in left ear. I would recommend call [facility] for a thorough evaluation.

## 2022-09-09 NOTE — Patient Instructions (Addendum)
  Ms. Berkey , Thank you for taking time to come for your Medicare Wellness Visit. I appreciate your ongoing commitment to your health goals. Please review the following plan we discussed and let me know if I can assist you in the future.   These are the goals we discussed:  Review advance directives.      This is a list of the screening recommended for you and due dates:  Health Maintenance  Topic Date Due   Tetanus Vaccine  Never done   Zoster (Shingles) Vaccine (1 of 2) Never done   COVID-19 Vaccine (5 - Moderna series) 01/29/2022   Mammogram  09/10/2022   Pneumonia Vaccine  Completed   Flu Shot  Completed   DEXA scan (bone density measurement)  Completed   HPV Vaccine  Aged Out     Things to do to keep yourself healthy  - Exercise at least 30-45 minutes a day, 3-4 days a week.  - Eat a low-fat diet with lots of fruits and vegetables, up to 7-9 servings per day.  - Seatbelts can save your life. Wear them always.  - Smoke detectors on every level of your home, check batteries every year.  - Eye Doctor - have an eye exam every 1-2 years  - Alex. Choose someone to speak for you if you are not able.  - Depression is common in our stressful world.If you're feeling down or losing interest in things you normally enjoy, please come in for a visit.  - Violence - If anyone is threatening or hurting you, please call immediately.   Some hearing loss in left ear. I would recommend call [facility] for a thorough evaluation.

## 2022-09-09 NOTE — Assessment & Plan Note (Signed)
Things to do to keep yourself healthy  - Exercise at least 30-45 minutes a day, 3-4 days a week.  - Eat a low-fat diet with lots of fruits and vegetables, up to 7-9 servings per day.  - Seatbelts can save your life. Wear them always.  - Smoke detectors on every level of your home, check batteries every year.  - Eye Doctor - have an eye exam every 1-2 years  - Health Care Power of Attorney. Choose someone to speak for you if you are not able.  - Depression is common in our stressful world.If you're feeling down or losing interest in things you normally enjoy, please come in for a visit.  - Violence - If anyone is threatening or hurting you, please call immediately. 

## 2022-09-09 NOTE — Progress Notes (Signed)
Subjective:  Patient ID: Pamela Price, female    DOB: Feb 03, 1933  Age: 88 y.o. MRN: [id number]  Chief Complaint  Patient presents with   Hyperlipidemia   Hypertension    HPI Patient presents for an annual wellness visit and chronic follow-up.  Patient denies any significant complaints today.  Patient eats healthy.  Patient exercises regularly.  Patient wears her seatbelt.  Patient has a functional carbon monoxide and smoke detectors in her home.  Patient sees an eye doctor on a regular basis.  Patient sees a Pharmacist, community on a regular basis.  Patient is widowed and lives independently alone.  She has 2 sons who she speaks with regularly and to help her as well as a neighbor. Patient has osteoporosis and gets a Prolia injection every 6 months and is due in December.  She takes calcium with vitamin D. Macular degeneration patient is on AREDS vitamin. Hypertension: Patient is taking Lisinopril 40 mg daily. Hyperlipidemia: Currently takes Pravastatin 40 mg daily. Osteoporosis: Prolia injection every 6 months.   Current Outpatient Medications on File Prior to Visit  Medication Sig Dispense Refill   Calcium Carbonate-Vitamin D 600-10 MG-MCG TABS Take 1 tablet by mouth daily. '600mg'$ /50mg     denosumab (PROLIA) 60 MG/ML SOSY injection Inject 60 mg into the skin every 6 (six) months.     finasteride (PROSCAR) 5 MG tablet Take by mouth.     lisinopril (ZESTRIL) 40 MG tablet TAKE 1 TABLET BY MOUTH TWICE A DAY 180 tablet 0   Multiple Vitamin (MULTIVITAMIN ADULT PO) Take 1 tablet by mouth daily.     Multiple Vitamins-Minerals (PRESERVISION AREDS 2) CAPS Take 1 capsule by mouth 2 (two) times daily.     pravastatin (PRAVACHOL) 40 MG tablet TAKE 1 TABLET BY MOUTH EVERYDAY AT BEDTIME 90 tablet 1   No current facility-administered medications on file prior to visit.   Past Medical History:  Diagnosis Date   Adnexal mass 06/30/2021   Arthritis    History of right hemicolectomy    HLD  (hyperlipidemia) 06/30/2021   Hypertension 06/30/2021   Left ovarian cyst 07/22/2019   Macular degeneration 06/30/2021   bilateral  gets eye injections   Osteoporosis 06/30/2021   Varicose veins of both lower extremities 06/30/2021   right leg only   Past Surgical History:  Procedure Laterality Date   BREAST LUMPECTOMY W/ NEEDLE LOCALIZATION     axillary tail of the left  breast    COLON SURGERY  02/2019   for stage I colon cancer   colonscopy  2020   ROBOTIC ASSISTED BILATERAL SALPINGO OOPHERECTOMY N/A 07/14/2021   ROBOTIC ASSISTED TOTAL HYSTERECTOMY WITH BILATERAL SALPINGO OOPHERECTOMY  07/14/2021   TOTAL HIP ARTHROPLASTY Right 08/30/2017   Procedure: RIGHT TOTAL HIP ARTHROPLASTY ANTERIOR APPROACH;  Surgeon: AGaynelle Arabian MD;  Location: [facility];  Service: Orthopedics;  Laterality: Right;    History reviewed. No pertinent family history. Social History   Socioeconomic History   Marital status: Widowed    Spouse name: Not on file   Number of children: 2   Years of education: Not on file   Highest education level: Not on file  Occupational History   Not on file  Tobacco Use   Smoking status: Former    Types: Cigarettes   Smokeless tobacco: Never   Tobacco comments:    Quit 50 years ago   Vaping Use   Vaping Use: Never used  Substance and Sexual Activity   Alcohol use: Yes  Comment: rare wine   Drug use: No   Sexual activity: Not on file  Other Topics Concern   Not on file  Social History Narrative   Not on file   Social Determinants of Health   Financial Resource Strain: Low Risk  (09/09/2022)   Overall Financial Resource Strain (CARDIA)    Difficulty of Paying Living Expenses: Not hard at all  Food Insecurity: No Food Insecurity (09/09/2022)   Hunger Vital Sign    Worried About Running Out of Food in the Last Year: Never true    Ran Out of Food in the Last Year: Never true  Transportation Needs: No Transportation Needs (09/09/2022)   PRAPARE - Armed forces logistics/support/administrative officer (Medical): No    Lack of Transportation (Non-Medical): No  Physical Activity: Sufficiently Active (09/09/2022)   Exercise Vital Sign    Days of Exercise per Week: 5 days    Minutes of Exercise per Session: 30 min  Stress: No Stress Concern Present (09/09/2022)   [place]    Feeling of Stress : Not at all  Social Connections: Socially Isolated (09/09/2022)   Social Connection and Isolation Panel [NHANES]    Frequency of Communication with Friends and Family: Twice a week    Frequency of Social Gatherings with Friends and Family: Twice a week    Attends Religious Services: Never    Marine scientist or Organizations: No    Attends Archivist Meetings: Never    Marital Status: Widowed    Review of Systems  Constitutional:  Negative for chills, fatigue and fever.  HENT:  Negative for congestion, ear pain and sore throat.   Respiratory:  Negative for cough and shortness of breath.   Cardiovascular:  Negative for chest pain.  Gastrointestinal:  Negative for abdominal pain, constipation, diarrhea, nausea and vomiting.  Genitourinary:  Negative for dysuria and urgency.       Nocturia   Musculoskeletal:  Negative for arthralgias and myalgias.  Skin:  Negative for rash.  Neurological:  Negative for dizziness and headaches.  Psychiatric/Behavioral:  Negative for dysphoric mood. The patient is not nervous/anxious.    SDOH Screenings   Food Insecurity: No Food Insecurity (09/09/2022)  Housing: Low Risk  (09/09/2022)  Transportation Needs: No Transportation Needs (09/09/2022)  Utilities: Not At Risk (09/09/2022)  Alcohol Screen: Low Risk  (09/09/2022)  Depression (PHQ2-9): Low Risk  (09/09/2022)  Financial Resource Strain: Low Risk  (09/09/2022)  Physical Activity: Sufficiently Active (09/09/2022)  Social Connections: Socially Isolated (09/09/2022)  Stress: No Stress Concern Present  (09/09/2022)  Tobacco Use: Medium Risk (09/09/2022)   Functional Status Survey: Is the patient deaf or have difficulty hearing?: Yes Does the patient have difficulty seeing, even when wearing glasses/contacts?: No Does the patient have difficulty concentrating, remembering, or making decisions?: No Does the patient have difficulty walking or climbing stairs?: No Does the patient have difficulty dressing or bathing?: No Does the patient have difficulty doing errands alone such as visiting a doctor's office or shopping?: No    Objective:  BP (!) 140/70 (BP Location: Left Arm, Cuff Size: Normal)   Pulse 62   Temp (!) 97.5 F (36.4 C)   Resp 14   Ht 5' (1.524 m)   Wt 111 lb (50.3 kg)   SpO2 99%   BMI 21.68 kg/m      09/09/2022   11:03 AM 09/09/2022   11:01 AM 09/09/2022   10:21 AM  BP/Weight  Systolic BP 161 096 045  Diastolic BP 70 70 60  Wt. (Lbs)   111  BMI   21.68 kg/m2    Physical Exam Vitals reviewed.  Constitutional:      Appearance: Normal appearance. She is normal weight.  Neck:     Vascular: No carotid bruit.  Cardiovascular:     Rate and Rhythm: Normal rate and regular rhythm.     Heart sounds: Normal heart sounds.  Pulmonary:     Effort: Pulmonary effort is normal. No respiratory distress.     Breath sounds: Normal breath sounds.  Abdominal:     General: Abdomen is flat. Bowel sounds are normal.     Palpations: Abdomen is soft.     Tenderness: There is no abdominal tenderness.  Skin:    Findings: Lesion (SKs. sees dermatology regularly.) present.  Neurological:     Mental Status: She is alert and oriented to person, place, and time.  Psychiatric:        Mood and Affect: Mood normal.        Behavior: Behavior normal.     Diabetic Foot Exam - Simple   No data filed      Lab Results  Component Value Date   WBC 6.7 03/10/2022   HGB 14.0 03/10/2022   HCT 41.4 03/10/2022   PLT 256 03/10/2022   GLUCOSE 94 03/10/2022   CHOL 176 03/10/2022   TRIG  166 (H) 03/10/2022   HDL 59 03/10/2022   LDLCALC 89 03/10/2022   ALT 17 03/10/2022   AST 21 03/10/2022   NA 141 03/10/2022   K 5.0 03/10/2022   CL 103 03/10/2022   CREATININE 0.67 03/10/2022   BUN 15 03/10/2022   CO2 26 03/10/2022   TSH 1.720 10/05/2020   INR 1.01 08/24/2017      Assessment & Plan:   Problem List Items Addressed This Visit       Cardiovascular and Mediastinum   Primary hypertension - Primary   Relevant Orders   Comprehensive metabolic panel   CBC with Differential/Platelet     Nervous and Auditory   Hearing loss     Some hearing loss in left ear. I would recommend call [facility] for a thorough evaluation.       Relevant Orders   PR TYMPANOMETRY     Musculoskeletal and Integument   Age-related osteoporosis without current pathological fracture   Relevant Medications   denosumab (PROLIA) 60 MG/ML SOSY injection   Other Relevant Orders   VITAMIN D 25 Hydroxy (Vit-D Deficiency, Fractures)     Other   Mixed hyperlipidemia   Relevant Orders   Lipid panel   TSH   Visit for screening mammogram   Relevant Orders   MM DIGITAL SCREENING BILATERAL   Need for immunization against influenza   Relevant Orders   Flu Vaccine QUAD High Dose(Fluad) (Completed)   Medicare annual wellness visit, subsequent     Things to do to keep yourself healthy  - Exercise at least 30-45 minutes a day, 3-4 days a week.  - Eat a low-fat diet with lots of fruits and vegetables, up to 7-9 servings per day.  - Seatbelts can save your life. Wear them always.  - Smoke detectors on every level of your home, check batteries every year.  - Eye Doctor - have an eye exam every 1-2 years  - [place]. Choose someone to speak for you if you are not able.  - Depression  is common in our stressful world.If you're feeling down or losing interest in things you normally enjoy, please come in for a visit.  - Violence - If anyone is threatening or hurting you,  please call immediately.       .  No orders of the defined types were placed in this encounter.   Orders Placed This Encounter  Procedures   MM DIGITAL SCREENING BILATERAL   Flu Vaccine QUAD High Dose(Fluad)   Comprehensive metabolic panel   Lipid panel   CBC with Differential/Platelet   VITAMIN D 25 Hydroxy (Vit-D Deficiency, Fractures)   TSH   PR TYMPANOMETRY     Follow-up: Return in about 6 months (around 03/10/2023) for Nurse visit.  6 months fasting.Marland Kitchen  An After Visit Summary was printed and given to the patient.  [name], MD [facility] [phone]

## 2022-09-10 LAB — CARDIOVASCULAR RISK ASSESSMENT

## 2022-09-10 LAB — CBC WITH DIFFERENTIAL/PLATELET
Basophils Absolute: 0.1 10*3/uL (ref 0.0–0.2)
Basos: 1 %
EOS (ABSOLUTE): 0.1 10*3/uL (ref 0.0–0.4)
Eos: 1 %
Hematocrit: 39.8 % (ref 34.0–46.6)
Hemoglobin: 13.2 g/dL (ref 11.1–15.9)
Immature Grans (Abs): 0 10*3/uL (ref 0.0–0.1)
Immature Granulocytes: 0 %
Lymphocytes Absolute: 1.6 10*3/uL (ref 0.7–3.1)
Lymphs: 27 %
MCH: 29.5 pg (ref 26.6–33.0)
MCHC: 33.2 g/dL (ref 31.5–35.7)
MCV: 89 fL (ref 79–97)
Monocytes Absolute: 0.5 10*3/uL (ref 0.1–0.9)
Monocytes: 9 %
Neutrophils Absolute: 3.6 10*3/uL (ref 1.4–7.0)
Neutrophils: 62 %
Platelets: 222 10*3/uL (ref 150–450)
RBC: 4.47 x10E6/uL (ref 3.77–5.28)
RDW: 12.6 % (ref 11.7–15.4)
WBC: 5.9 10*3/uL (ref 3.4–10.8)

## 2022-09-10 LAB — LIPID PANEL
Chol/HDL Ratio: 3 ratio (ref 0.0–4.4)
Cholesterol, Total: 186 mg/dL (ref 100–199)
HDL: 61 mg/dL
LDL Chol Calc (NIH): 102 mg/dL — ABNORMAL HIGH (ref 0–99)
Triglycerides: 131 mg/dL (ref 0–149)
VLDL Cholesterol Cal: 23 mg/dL (ref 5–40)

## 2022-09-10 LAB — COMPREHENSIVE METABOLIC PANEL WITH GFR
ALT: 19 [IU]/L (ref 0–32)
AST: 21 [IU]/L (ref 0–40)
Albumin/Globulin Ratio: 1.8 (ref 1.2–2.2)
Albumin: 4.6 g/dL (ref 3.7–4.7)
Alkaline Phosphatase: 65 [IU]/L (ref 44–121)
BUN/Creatinine Ratio: 31 — ABNORMAL HIGH (ref 12–28)
BUN: 22 mg/dL (ref 8–27)
Bilirubin Total: 0.4 mg/dL (ref 0.0–1.2)
CO2: 24 mmol/L (ref 20–29)
Calcium: 10 mg/dL (ref 8.7–10.3)
Chloride: 103 mmol/L (ref 96–106)
Creatinine, Ser: 0.72 mg/dL (ref 0.57–1.00)
Globulin, Total: 2.5 g/dL (ref 1.5–4.5)
Glucose: 99 mg/dL (ref 70–99)
Potassium: 5.3 mmol/L — ABNORMAL HIGH (ref 3.5–5.2)
Sodium: 140 mmol/L (ref 134–144)
Total Protein: 7.1 g/dL (ref 6.0–8.5)
eGFR: 81 mL/min/{1.73_m2}

## 2022-09-10 LAB — VITAMIN D 25 HYDROXY (VIT D DEFICIENCY, FRACTURES): Vit D, 25-Hydroxy: 48.6 ng/mL (ref 30.0–100.0)

## 2022-09-10 LAB — TSH: TSH: 1.46 u[IU]/mL (ref 0.450–4.500)

## 2022-09-11 NOTE — Progress Notes (Signed)
Blood count normal.  Liver function normal.  Kidney function normal.  Thyroid function normal.  Cholesterol: good. Vitamin D normal.

## 2022-10-05 ENCOUNTER — Ambulatory Visit
Admission: RE | Admit: 2022-10-05 | Discharge: 2022-10-05 | Disposition: A | Discharge status: Home / Self Care | Payer: Medicare HMO | Source: Ambulatory Visit | Attending: Family Medicine | Admitting: Family Medicine

## 2022-10-05 DIAGNOSIS — Z1231 Encounter for screening mammogram for malignant neoplasm of breast: Secondary | ICD-10-CM

## 2022-11-15 ENCOUNTER — Ambulatory Visit (INDEPENDENT_AMBULATORY_CARE_PROVIDER_SITE_OTHER): Payer: Medicare HMO

## 2022-11-15 DIAGNOSIS — M810 Age-related osteoporosis without current pathological fracture: Secondary | ICD-10-CM

## 2022-11-15 MED ORDER — DENOSUMAB 60 MG/ML ~~LOC~~ SOSY
60.0000 mg | PREFILLED_SYRINGE | Freq: Once | SUBCUTANEOUS | Status: AC
  Administered 2022-11-15: 60 mg via SUBCUTANEOUS

## 2022-11-15 NOTE — Progress Notes (Signed)
   Prolia injection given per order, patient tolerated well.   Erie Noe, LPN 01:41 AM

## 2022-11-22 ENCOUNTER — Other Ambulatory Visit: Payer: Self-pay | Admitting: Physician Assistant

## 2022-11-22 ENCOUNTER — Other Ambulatory Visit: Payer: Self-pay | Admitting: Family Medicine

## 2022-11-22 DIAGNOSIS — I10 Essential (primary) hypertension: Secondary | ICD-10-CM

## 2022-11-22 DIAGNOSIS — E782 Mixed hyperlipidemia: Secondary | ICD-10-CM

## 2022-11-24 DIAGNOSIS — H353231 Exudative age-related macular degeneration, bilateral, with active choroidal neovascularization: Secondary | ICD-10-CM | POA: Diagnosis not present

## 2023-02-16 DIAGNOSIS — H47392 Other disorders of optic disc, left eye: Secondary | ICD-10-CM | POA: Diagnosis not present

## 2023-02-16 DIAGNOSIS — H04123 Dry eye syndrome of bilateral lacrimal glands: Secondary | ICD-10-CM | POA: Diagnosis not present

## 2023-02-16 DIAGNOSIS — H353231 Exudative age-related macular degeneration, bilateral, with active choroidal neovascularization: Secondary | ICD-10-CM | POA: Diagnosis not present

## 2023-02-17 ENCOUNTER — Other Ambulatory Visit: Payer: Self-pay | Admitting: Family Medicine

## 2023-02-17 DIAGNOSIS — E782 Mixed hyperlipidemia: Secondary | ICD-10-CM

## 2023-03-14 ENCOUNTER — Ambulatory Visit: Payer: Medicare HMO | Admitting: Family Medicine

## 2023-04-17 ENCOUNTER — Telehealth: Payer: Self-pay

## 2023-04-17 NOTE — Progress Notes (Signed)
Patient due for Prolia injection 05/17/2023, sent re-certification through [employer] portal, awaiting benefit information.    Billee Cashing, CMA Clinical Pharmacist Assistant [phone]

## 2023-04-26 ENCOUNTER — Telehealth: Payer: Self-pay

## 2023-04-26 NOTE — Progress Notes (Signed)
Benefit verification complete, Per [employer]: Physician Purchase available and Prior authorization required.  Prolia and adminstration will be subject to 20.0% coinsurance up to a $4,500.00 out of pocket max ($941.16 met). Once met, coverage increases to 100%. No deductible applies. The benefits provided on this Verification of Benefits form are Medical Benefits and are the patient's In-Network benefits for Prolia.  PA is required. Call [phone] or complete the PA form available at [email or web address] Fax completed form to [phone]. Or submit PA request online at [email or web address]  Filling out medication precertification request and will fax to [facility]  Per Caro Hight, LPN PA was completed and approved.

## 2023-05-01 NOTE — Telephone Encounter (Signed)
PA Approved for Prolia, Auth# [id number] 05/14/23 - 05/13/24.

## 2023-05-10 ENCOUNTER — Telehealth: Payer: Self-pay

## 2023-05-10 NOTE — Telephone Encounter (Addendum)
Attempted to reach patient to schedule appointment to get her Prolia.  She is due 05/18/23.  05/12/23 - Patient scheduled for 05/18/23 and is aware to call 30 minutes prior.

## 2023-05-11 DIAGNOSIS — H353231 Exudative age-related macular degeneration, bilateral, with active choroidal neovascularization: Secondary | ICD-10-CM | POA: Diagnosis not present

## 2023-05-18 ENCOUNTER — Ambulatory Visit (INDEPENDENT_AMBULATORY_CARE_PROVIDER_SITE_OTHER): Payer: Medicare HMO

## 2023-05-18 DIAGNOSIS — M810 Age-related osteoporosis without current pathological fracture: Secondary | ICD-10-CM | POA: Diagnosis not present

## 2023-05-18 MED ORDER — DENOSUMAB 60 MG/ML ~~LOC~~ SOSY
60.0000 mg | PREFILLED_SYRINGE | Freq: Once | SUBCUTANEOUS | Status: AC
  Administered 2023-05-18: 60 mg via SUBCUTANEOUS

## 2023-05-18 NOTE — Progress Notes (Signed)
   Patient: Pamela Price  DOB: May 21, 1933  MRN: [id number]    Visit Date: 05/18/2023    [name] presents today for her six month Prolia injection.  1 x 60 mg Single-Dose Prefilled Syringe was given SQ in the left arm.  Patient tolerated the injection well and has no questions.  The next Prolia injection will be due in six months.

## 2023-06-02 ENCOUNTER — Other Ambulatory Visit: Payer: Self-pay | Admitting: Family Medicine

## 2023-06-02 DIAGNOSIS — I10 Essential (primary) hypertension: Secondary | ICD-10-CM

## 2023-08-10 DIAGNOSIS — H353231 Exudative age-related macular degeneration, bilateral, with active choroidal neovascularization: Secondary | ICD-10-CM | POA: Diagnosis not present

## 2023-08-25 ENCOUNTER — Other Ambulatory Visit: Payer: Self-pay | Admitting: Physician Assistant

## 2023-08-25 DIAGNOSIS — I10 Essential (primary) hypertension: Secondary | ICD-10-CM

## 2023-09-01 ENCOUNTER — Other Ambulatory Visit: Payer: Self-pay | Admitting: Family Medicine

## 2023-09-01 DIAGNOSIS — E782 Mixed hyperlipidemia: Secondary | ICD-10-CM

## 2023-09-11 NOTE — Progress Notes (Unsigned)
Subjective:   Pamela Price is a 89 y.o. female who presents for Medicare Annual (Subsequent) preventive examination.  Visit Complete: In person  Patient Medicare AWV questionnaire was completed by the patient I have confirmed that all information answered by patient is correct and no changes since this date.  Patient presents for an annual wellness visit and chronic follow-up.  Patient denies any significant complaints today.  Patient eats healthy.  Patient exercises regularly.  Patient wears her seatbelt.  Patient has a functional smoke detectors in her home.  Patient sees an eye doctor macular degeneration every 3 months.  Patient sees a Education officer, community on a regular basis.  Patient is widowed and lives independently alone.  She has 2 sons who she speaks with regularly and to help her as well as a neighbor.  Macular degeneration patient is on AREDS vitamin. Hypertension: Patient is taking Lisinopril 40 mg daily. Hyperlipidemia: Currently takes Pravastatin 40 mg daily. Osteoporosis: Prolia injection every 6 months and taking calcium carbonate with vitamin d.   Cardiac Risk Factors include: advanced age (>67men, >38 women);dyslipidemia  Review of Systems  Constitutional:  Negative for chills, diaphoresis, fever and malaise/fatigue.  HENT:  Negative for congestion, ear pain and sore throat.   Respiratory:  Negative for cough and sputum production.   Cardiovascular:  Negative for chest pain and palpitations.  Gastrointestinal:  Negative for abdominal pain, constipation, diarrhea, nausea and vomiting.  Genitourinary:  Negative for dysuria and urgency.  Musculoskeletal:  Negative for myalgias.  Neurological:  Negative for dizziness and headaches.  Psychiatric/Behavioral:  Negative for depression. The patient is not nervous/anxious.        Objective:    Today's Vitals   09/12/23 1020  BP: (!) 134/58  Pulse: 69  Temp: (!) 97.2 F (36.2 C)  SpO2: 99%  Weight: 110 lb (49.9 kg)   Height: 5' 1.5" (1.562 m)  PainSc: 0-No pain   Body mass index is 20.45 kg/m.  Physical Exam Vitals reviewed.  Constitutional:      Appearance: Normal appearance. She is normal weight.  Neck:     Vascular: No carotid bruit.  Cardiovascular:     Rate and Rhythm: Normal rate and regular rhythm.     Heart sounds: Normal heart sounds.  Pulmonary:     Effort: Pulmonary effort is normal. No respiratory distress.     Breath sounds: Normal breath sounds.  Abdominal:     General: Abdomen is flat. Bowel sounds are normal.     Palpations: Abdomen is soft.     Tenderness: There is no abdominal tenderness.  Neurological:     Mental Status: She is alert and oriented to person, place, and time.  Psychiatric:        Mood and Affect: Mood normal.        Behavior: Behavior normal.        09/12/2023   10:22 AM 07/14/2021    1:10 PM 10/27/2020    9:15 AM 08/30/2017    6:00 PM 08/30/2017    1:18 PM 08/24/2017    1:36 PM  Advanced Directives  Does Patient Have a Medical Advance Directive? Yes Yes Yes Yes Yes Yes  Type of Estate agent of [place];Living will Healthcare Power of [place];Living will Living will Living will Living will Living will  Does patient want to make changes to medical advance directive? No - Patient declined Yes (MAU/Ambulatory/Procedural Areas - Information given) Yes (Inpatient - patient defers changing a medical advance directive and declines  information at this time) No - Patient declined No - Patient declined No - Patient declined  Copy of Healthcare Power of Attorney in Chart? No - copy requested         Current Medications (verified) Outpatient Encounter Medications as of 09/12/2023  Medication Sig   Calcium Carbonate-Vitamin D 600-10 MG-MCG TABS Take 1 tablet by mouth daily. 600mg /32mcg   denosumab (PROLIA) 60 MG/ML SOSY injection Inject 60 mg into the skin every 6 (six) months.   finasteride (PROSCAR) 5 MG tablet Take by mouth.   lisinopril  (ZESTRIL) 40 MG tablet TAKE 1 TABLET BY MOUTH TWICE A DAY   Multiple Vitamin (MULTIVITAMIN ADULT PO) Take 1 tablet by mouth daily.   Multiple Vitamins-Minerals (PRESERVISION AREDS 2) CAPS Take 1 capsule by mouth 2 (two) times daily.   pravastatin (PRAVACHOL) 40 MG tablet TAKE 1 TABLET BY MOUTH EVERYDAY AT BEDTIME   No facility-administered encounter medications on file as of 09/12/2023.    Allergies (verified) Sulfa antibiotics   History: Past Medical History:  Diagnosis Date   Adnexal mass 06/30/2021   Arthritis    History of right hemicolectomy    HLD (hyperlipidemia) 06/30/2021   Hypertension 06/30/2021   Left ovarian cyst 07/22/2019   Macular degeneration 06/30/2021   bilateral  gets eye injections   Osteoporosis 06/30/2021   Varicose veins of both lower extremities 06/30/2021   right leg only   Past Surgical History:  Procedure Laterality Date   BREAST LUMPECTOMY W/ NEEDLE LOCALIZATION     axillary tail of the left  breast    COLON SURGERY  02/2019   for stage I colon cancer   colonscopy  2020   ROBOTIC ASSISTED BILATERAL SALPINGO OOPHERECTOMY N/A 07/14/2021   ROBOTIC ASSISTED TOTAL HYSTERECTOMY WITH BILATERAL SALPINGO OOPHERECTOMY  07/14/2021   TOTAL HIP ARTHROPLASTY Right 08/30/2017   Procedure: RIGHT TOTAL HIP ARTHROPLASTY ANTERIOR APPROACH;  Surgeon: Ollen Gross, MD;  Location: [facility];  Service: Orthopedics;  Laterality: Right;   History reviewed. No pertinent family history. Social History   Socioeconomic History   Marital status: Widowed    Spouse name: Not on file   Number of children: 2   Years of education: Not on file   Highest education level: Not on file  Occupational History   Not on file  Tobacco Use   Smoking status: Former    Types: Cigarettes   Smokeless tobacco: Never   Tobacco comments:    Quit 50 years ago   Vaping Use   Vaping status: Never Used  Substance and Sexual Activity   Alcohol use: Yes    Comment: rare wine   Drug use: No    Sexual activity: Not on file  Other Topics Concern   Not on file  Social History Narrative   Not on file   Social Determinants of Health   Financial Resource Strain: Low Risk  (09/12/2023)   Overall Financial Resource Strain (CARDIA)    Difficulty of Paying Living Expenses: Not hard at all  Food Insecurity: No Food Insecurity (09/12/2023)   Hunger Vital Sign    Worried About Running Out of Food in the Last Year: Never true    Ran Out of Food in the Last Year: Never true  Transportation Needs: No Transportation Needs (09/12/2023)   PRAPARE - Administrator, Civil Service (Medical): No    Lack of Transportation (Non-Medical): No  Physical Activity: Sufficiently Active (09/12/2023)   Exercise Vital Sign    Days of  Exercise per Week: 5 days    Minutes of Exercise per Session: 30 min  Stress: No Stress Concern Present (09/12/2023)   [employer] of Occupational Health - Occupational Stress Questionnaire    Feeling of Stress : Not at all  Social Connections: Socially Isolated (09/12/2023)   Social Connection and Isolation Panel [NHANES]    Frequency of Communication with Friends and Family: Twice a week    Frequency of Social Gatherings with Friends and Family: Twice a week    Attends Religious Services: Never    Database administrator or Organizations: No    Attends Banker Meetings: Never    Marital Status: Widowed    Tobacco Counseling Counseling given: Not Answered Tobacco comments: Quit 50 years ago    Clinical Intake:  Pre-visit preparation completed: No  Pain : No/denies pain Pain Score: 0-No pain     Nutritional Status: BMI of 19-24  Normal Diabetes: No  How often do you need to have someone help you when you read instructions, pamphlets, or other written materials from your doctor or pharmacy?: 1 - Never  Interpreter Needed?: No      Activities of Daily Living    09/12/2023   10:25 AM  In your present state of health, do you  have any difficulty performing the following activities:  Hearing? 1  Comment Wears hearing aids  Vision? 0  Difficulty concentrating or making decisions? 0  Walking or climbing stairs? 0  Dressing or bathing? 0  Doing errands, shopping? 0  Preparing Food and eating ? N  Using the Toilet? N  In the past six months, have you accidently leaked urine? N  Do you have problems with loss of bowel control? N  Managing your Finances? N  Housekeeping or managing your Housekeeping? N    Patient Care Team: Blane Ohara, MD as PCP - General (Family Medicine) Zettie Pho, [facility] (Inactive) as Pharmacist (Pharmacist)  Indicate any recent Medical Services you may have received from other than [facility] providers in the past year (date may be approximate).     Assessment:   This is a routine wellness examination for [place].  Hearing/Vision screen No results found.  Depression Screen    09/12/2023   10:24 AM 09/09/2022   10:22 AM 09/03/2021    9:38 AM 10/27/2020    9:16 AM  PHQ 2/9 Scores  PHQ - 2 Score 0 0 0 0  PHQ- 9 Score  0      Fall Risk    09/12/2023   10:24 AM 03/10/2022   10:21 AM 09/03/2021    9:38 AM 06/02/2021    9:09 AM 10/27/2020    9:15 AM  Fall Risk   Falls in the past year? 0 0 0 0 0  Number falls in past yr: 0 0 0 0 0  Injury with Fall? 0 0 0 0 0  Risk for fall due to : No Fall Risks   No Fall Risks No Fall Risks  Follow up Falls evaluation completed  Falls evaluation completed Falls evaluation completed     MEDICARE RISK AT HOME:    TIMED UP AND GO:  Was the test performed?  No    Cognitive Function:    09/03/2021   10:09 AM  MMSE - Mini Mental State Exam  Orientation to time 5  Orientation to Place 5  Registration 3  Attention/ Calculation 4  Recall 3  Language- name 2 objects 2  Language- repeat 1  Language- follow 3 step command 3  Language- read & follow direction 1  Write a sentence 1  Copy design 1  Total score 29        09/12/2023    10:23 AM 10/27/2020    9:17 AM  6CIT Screen  What Year? 0 points 0 points  What month? 0 points 0 points  What time? 0 points 0 points  Count back from 20 0 points 0 points  Months in reverse 0 points 0 points  Repeat phrase 10 points 0 points  Total Score 10 points 0 points    Immunizations Immunization History  Administered Date(s) Administered   Fluad Quad(high Dose 65+) 10/05/2020, 09/03/2021, 09/09/2022   Fluad Trivalent(High Dose 65+) 09/12/2023   Moderna Covid-19 Vaccine Bivalent Booster 44yrs & up 09/28/2021   Moderna SARS-COV2 Booster Vaccination 11/10/2020   Moderna Sars-Covid-2 Vaccination 01/23/2020, 02/17/2020, 05/19/2021   Pneumococcal Conjugate-13 03/30/2016   Pneumococcal Polysaccharide-23 09/18/2012     Screening Tests Health Maintenance  Topic Date Due   DTaP/Tdap/Td (1 - Tdap) Never done   Zoster Vaccines- Shingrix (1 of 2) Never done   COVID-19 Vaccine (6 - 2023-24 season) 08/13/2023   MAMMOGRAM  10/06/2023   Medicare Annual Wellness (AWV)  09/15/2024   Pneumonia Vaccine 53+ Years old  Completed   INFLUENZA VACCINE  Completed   DEXA SCAN  Completed   HPV VACCINES  Aged Out    Health Maintenance  Health Maintenance Due  Topic Date Due   DTaP/Tdap/Td (1 - Tdap) Never done   Zoster Vaccines- Shingrix (1 of 2) Never done   COVID-19 Vaccine (6 - 2023-24 season) 08/13/2023    Colorectal cancer screening: No longer required.   Mammogram status: Ordered today. Pt provided with contact info and advised to call to schedule appt.   Bone Density status: Ordered today. Pt provided with contact info and advised to call to schedule appt.  Lung Cancer Screening: (Low Dose CT Chest recommended if Age 33-80 years, 20 pack-year currently smoking OR have quit w/in 15years.) does not qualify.   Additional Screening:  Hepatitis C Screening: does not qualify;   Vision Screening: Recommended annual ophthalmology exams for early detection of glaucoma and other  disorders of the eye. Is the patient up to date with their annual eye exam?  Yes  If pt is not established with a provider, would they like to be referred to a provider to establish care? No .   Dental Screening: Recommended annual dental exams for proper oral hygiene   Community Resource Referral / Chronic Care Management: CRR required this visit?  No   CCM required this visit?  No     Plan:   Encounter for Medicare annual wellness exam Assessment & Plan: Things to do to keep yourself healthy  - Exercise at least 30-45 minutes a day, 3-4 days a week.  - Eat a low-fat diet with lots of fruits and vegetables, up to 7-9 servings per day.  - Seatbelts can save your life. Wear them always.  - Smoke detectors on every level of your home, check batteries every year.  - Eye Doctor - have an eye exam every 1-2 years  - Safe sex - if you may be exposed to STDs, use a condom.  - Alcohol -  If you drink, do it moderately, less than 2 drinks per day.  - Health Care Power of Attorney. Choose someone to speak for you if you are not able.  - Depression is common  in our stressful world.If you're feeling down or losing interest in things you normally enjoy, please come in for a visit.  - Violence - If anyone is threatening or hurting you, please call immediately.    Encounter for immunization -     Flu Vaccine Trivalent High Dose (Fluad)  Primary hypertension Assessment & Plan: Well controlled.  No changes to medicines. lisinopril 40 mg twice daily. Patient has white coat hypertension also. Continue to work on eating a healthy diet and exercise.  Labs drawn today.     Age-related osteoporosis without current pathological fracture Assessment & Plan: Ordering DEXA scan Check labs  Orders: -     DG Bone Density; Future -     VITAMIN D 25 Hydroxy (Vit-D Deficiency, Fractures)  Mixed hyperlipidemia Assessment & Plan: Well controlled.  No changes to medicines. Continue pravastatin 40  mg daily. Continue to work on eating a healthy diet and exercise.  Labs drawn today.    Orders: -     CBC with Differential/Platelet -     Comprehensive metabolic panel -     Lipid panel -     TSH  Macular degeneration of both eyes, unspecified type Assessment & Plan: Follow up regularly with opthomology.       I have personally reviewed and noted the following in the patient's chart:   Medical and social history Use of alcohol, tobacco or illicit drugs  Current medications and supplements including opioid prescriptions.  Functional ability and status Nutritional status Physical activity Advanced directives List of other physicians Hospitalizations, surgeries, and ER visits in previous 12 months Vitals Screenings to include cognitive, depression, and falls Referrals and appointments  In addition, I have reviewed and discussed with patient certain preventive protocols, quality metrics, and best practice recommendations. A written personalized care plan for preventive services as well as general preventive health recommendations were provided to patient.    Clayborn Bigness I Leal-Borjas,acting as a scribe for Blane Ohara, MD.,have documented all relevant documentation on the behalf of Blane Ohara, MD,as directed by  Blane Ohara, MD while in the presence of Blane Ohara, MD.   I attest that I have reviewed this visit and agree with the plan scribed by my staff.   Blane Ohara, MD [facility] [phone]

## 2023-09-12 ENCOUNTER — Ambulatory Visit: Payer: Medicare HMO | Admitting: Family Medicine

## 2023-09-12 ENCOUNTER — Encounter: Payer: Self-pay | Admitting: Family Medicine

## 2023-09-12 VITALS — BP 134/58 | HR 69 | Temp 97.2°F | Ht 61.5 in | Wt 110.0 lb

## 2023-09-12 DIAGNOSIS — Z23 Encounter for immunization: Secondary | ICD-10-CM | POA: Diagnosis not present

## 2023-09-12 DIAGNOSIS — H3530 Unspecified macular degeneration: Secondary | ICD-10-CM | POA: Diagnosis not present

## 2023-09-12 DIAGNOSIS — Z0000 Encounter for general adult medical examination without abnormal findings: Secondary | ICD-10-CM

## 2023-09-12 DIAGNOSIS — M810 Age-related osteoporosis without current pathological fracture: Secondary | ICD-10-CM | POA: Diagnosis not present

## 2023-09-12 DIAGNOSIS — I10 Essential (primary) hypertension: Secondary | ICD-10-CM | POA: Diagnosis not present

## 2023-09-12 DIAGNOSIS — E782 Mixed hyperlipidemia: Secondary | ICD-10-CM

## 2023-09-12 NOTE — Patient Instructions (Signed)
Discuss with pharmacy about Shingrix and Tdap vaccines. May get Covid vaccine at our office or at the pharmacy.

## 2023-09-13 LAB — COMPREHENSIVE METABOLIC PANEL WITH GFR
ALT: 18 [IU]/L (ref 0–32)
AST: 22 [IU]/L (ref 0–40)
Albumin: 4.4 g/dL (ref 3.7–4.7)
Alkaline Phosphatase: 60 [IU]/L (ref 44–121)
BUN/Creatinine Ratio: 24 (ref 12–28)
BUN: 17 mg/dL (ref 8–27)
Bilirubin Total: 0.5 mg/dL (ref 0.0–1.2)
CO2: 24 mmol/L (ref 20–29)
Calcium: 9.7 mg/dL (ref 8.7–10.3)
Chloride: 103 mmol/L (ref 96–106)
Creatinine, Ser: 0.71 mg/dL (ref 0.57–1.00)
Globulin, Total: 2.6 g/dL (ref 1.5–4.5)
Glucose: 96 mg/dL (ref 70–99)
Potassium: 5.1 mmol/L (ref 3.5–5.2)
Sodium: 141 mmol/L (ref 134–144)
Total Protein: 7 g/dL (ref 6.0–8.5)
eGFR: 82 mL/min/{1.73_m2}

## 2023-09-13 LAB — CBC WITH DIFFERENTIAL/PLATELET
Basophils Absolute: 0.1 10*3/uL (ref 0.0–0.2)
Basos: 1 %
EOS (ABSOLUTE): 0.1 10*3/uL (ref 0.0–0.4)
Eos: 1 %
Hematocrit: 41.3 % (ref 34.0–46.6)
Hemoglobin: 13.1 g/dL (ref 11.1–15.9)
Immature Grans (Abs): 0 10*3/uL (ref 0.0–0.1)
Immature Granulocytes: 0 %
Lymphocytes Absolute: 1.6 10*3/uL (ref 0.7–3.1)
Lymphs: 22 %
MCH: 29 pg (ref 26.6–33.0)
MCHC: 31.7 g/dL (ref 31.5–35.7)
MCV: 91 fL (ref 79–97)
Monocytes Absolute: 0.6 10*3/uL (ref 0.1–0.9)
Monocytes: 9 %
Neutrophils Absolute: 4.6 10*3/uL (ref 1.4–7.0)
Neutrophils: 67 %
Platelets: 241 10*3/uL (ref 150–450)
RBC: 4.52 x10E6/uL (ref 3.77–5.28)
RDW: 12.8 % (ref 11.7–15.4)
WBC: 7 10*3/uL (ref 3.4–10.8)

## 2023-09-13 LAB — LIPID PANEL
Chol/HDL Ratio: 3 ratio (ref 0.0–4.4)
Cholesterol, Total: 178 mg/dL (ref 100–199)
HDL: 60 mg/dL
LDL Chol Calc (NIH): 90 mg/dL (ref 0–99)
Triglycerides: 167 mg/dL — ABNORMAL HIGH (ref 0–149)
VLDL Cholesterol Cal: 28 mg/dL (ref 5–40)

## 2023-09-13 LAB — TSH: TSH: 1.99 u[IU]/mL (ref 0.450–4.500)

## 2023-09-13 LAB — VITAMIN D 25 HYDROXY (VIT D DEFICIENCY, FRACTURES): Vit D, 25-Hydroxy: 44.4 ng/mL (ref 30.0–100.0)

## 2023-09-16 DIAGNOSIS — Z0000 Encounter for general adult medical examination without abnormal findings: Secondary | ICD-10-CM | POA: Insufficient documentation

## 2023-09-16 NOTE — Assessment & Plan Note (Signed)
Well controlled.  No changes to medicines. Continue pravastatin 40 mg daily.  Continue to work on eating a healthy diet and exercise.  Labs drawn today.   

## 2023-09-16 NOTE — Assessment & Plan Note (Signed)

## 2023-09-16 NOTE — Assessment & Plan Note (Signed)
Ordering DEXA scan Check labs

## 2023-09-16 NOTE — Assessment & Plan Note (Signed)
Well controlled.  ?No changes to medicines. lisinopril 40 mg twice daily. Patient has white coat hypertension also. ?Continue to work on eating a healthy diet and exercise.  ?Labs drawn today.  ? ?

## 2023-09-17 NOTE — Assessment & Plan Note (Signed)
Follow up regularly with opthomology.  ?

## 2023-09-20 ENCOUNTER — Telehealth: Payer: Self-pay | Admitting: Family Medicine

## 2023-09-20 NOTE — Telephone Encounter (Signed)
   Sherran Needs has been scheduled for the following appointment:  WHAT: BONE DENSITY WHERE: [place] [facility] DATE: 10/18/2023 TIME: 10:00 AM CHECK-IN  Patient has been made aware.

## 2023-10-18 ENCOUNTER — Encounter: Payer: Self-pay | Admitting: Family Medicine

## 2023-10-18 DIAGNOSIS — M810 Age-related osteoporosis without current pathological fracture: Secondary | ICD-10-CM | POA: Diagnosis not present

## 2023-10-18 LAB — HM DEXA SCAN

## 2023-11-16 ENCOUNTER — Telehealth: Payer: Self-pay

## 2023-11-16 DIAGNOSIS — H353231 Exudative age-related macular degeneration, bilateral, with active choroidal neovascularization: Secondary | ICD-10-CM | POA: Diagnosis not present

## 2023-11-16 NOTE — Telephone Encounter (Signed)
Patient called and was concern about her Prolia shot, she is due for it today.  Explain to patient that we do not have her shot here in office and I will sent a message to the nurse that orders prolia and see when her shot will be here and give her a call back.

## 2023-11-20 DIAGNOSIS — M25561 Pain in right knee: Secondary | ICD-10-CM | POA: Diagnosis not present

## 2023-11-24 ENCOUNTER — Other Ambulatory Visit: Payer: Self-pay | Admitting: Physician Assistant

## 2023-11-24 DIAGNOSIS — I10 Essential (primary) hypertension: Secondary | ICD-10-CM

## 2023-12-04 ENCOUNTER — Ambulatory Visit: Payer: Medicare HMO

## 2023-12-04 DIAGNOSIS — M810 Age-related osteoporosis without current pathological fracture: Secondary | ICD-10-CM

## 2023-12-04 MED ORDER — DENOSUMAB 60 MG/ML ~~LOC~~ SOSY: 60.0000 mg | PREFILLED_SYRINGE | Freq: Once | SUBCUTANEOUS | Status: DC

## 2023-12-04 MED ORDER — DENOSUMAB 60 MG/ML ~~LOC~~ SOSY
60.0000 mg | PREFILLED_SYRINGE | Freq: Once | SUBCUTANEOUS | Status: AC
  Administered 2023-12-04: 60 mg via SUBCUTANEOUS

## 2023-12-04 NOTE — Progress Notes (Signed)
Patient tolerated Prolia injection well

## 2024-02-08 DIAGNOSIS — H353231 Exudative age-related macular degeneration, bilateral, with active choroidal neovascularization: Secondary | ICD-10-CM | POA: Diagnosis not present

## 2024-02-19 DIAGNOSIS — H52223 Regular astigmatism, bilateral: Secondary | ICD-10-CM | POA: Diagnosis not present

## 2024-02-19 DIAGNOSIS — H353111 Nonexudative age-related macular degeneration, right eye, early dry stage: Secondary | ICD-10-CM | POA: Diagnosis not present

## 2024-02-27 ENCOUNTER — Other Ambulatory Visit: Payer: Self-pay | Admitting: Family Medicine

## 2024-02-27 DIAGNOSIS — E782 Mixed hyperlipidemia: Secondary | ICD-10-CM

## 2024-02-27 NOTE — Telephone Encounter (Signed)
 Copied from CRM [id number]. Topic: Clinical - Medication Refill >> Feb 27, 2024  3:26 PM Franchot Heidelberg wrote: Most Recent Primary Care Visit:  Provider: Tawny Asal I  Department: [facility]  Visit Type: CLINICAL SUPPORT  Date: 12/04/2023  Medication: pravastatin (PRAVACHOL) 40 MG tablet   Has the patient contacted their pharmacy? Yes (Agent: If no, request that the patient contact the pharmacy for the refill. If patient does not wish to contact the pharmacy document the reason why and proceed with request.) (Agent: If yes, when and what did the pharmacy advise?)  Is this the correct pharmacy for this prescription? Yes If no, delete pharmacy and type the correct one.  This is the patient's preferred pharmacy:   [facility] [address], [place] - [address] [place] [address] Phone: [phone] Fax: [phone]   Has the prescription been filled recently? Yes  Is the patient out of the medication? No. Pt states that this medication needed a new Rx from PCP  Has the patient been seen for an appointment in the last year OR does the patient have an upcoming appointment? Yes  Can we respond through MyChart? No.  Agent: Please be advised that Rx refills may take up to 3 business days. We ask that you follow-up with your pharmacy.

## 2024-04-24 ENCOUNTER — Telehealth: Payer: Self-pay

## 2024-04-24 NOTE — Telephone Encounter (Signed)
 LM for patient to call back - I need to get current insurance info so I can verify benefits for Prolia  (due 06/04/24)

## 2024-04-25 NOTE — Telephone Encounter (Signed)
 Copied from CRM [id number]. Topic: General - Other >> Apr 25, 2024  9:20 AM Carlatta H wrote: Reason for CRM: Patient called to advised Nurse Jullie Oiler of insurance information//confirmed all insurance information is correct//

## 2024-05-02 DIAGNOSIS — H353231 Exudative age-related macular degeneration, bilateral, with active choroidal neovascularization: Secondary | ICD-10-CM | POA: Diagnosis not present

## 2024-05-20 ENCOUNTER — Other Ambulatory Visit: Payer: Self-pay | Admitting: Family Medicine

## 2024-05-20 DIAGNOSIS — I10 Essential (primary) hypertension: Secondary | ICD-10-CM

## 2024-05-24 ENCOUNTER — Encounter: Payer: Self-pay | Admitting: Family Medicine

## 2024-05-24 ENCOUNTER — Ambulatory Visit (INDEPENDENT_AMBULATORY_CARE_PROVIDER_SITE_OTHER): Admitting: Family Medicine

## 2024-05-24 VITALS — BP 138/60 | HR 78 | Temp 98.0°F | Ht 61.5 in | Wt 110.0 lb

## 2024-05-24 DIAGNOSIS — H3530 Unspecified macular degeneration: Secondary | ICD-10-CM | POA: Diagnosis not present

## 2024-05-24 DIAGNOSIS — I10 Essential (primary) hypertension: Secondary | ICD-10-CM

## 2024-05-24 DIAGNOSIS — M810 Age-related osteoporosis without current pathological fracture: Secondary | ICD-10-CM | POA: Diagnosis not present

## 2024-05-24 DIAGNOSIS — R413 Other amnesia: Secondary | ICD-10-CM | POA: Diagnosis not present

## 2024-05-24 DIAGNOSIS — R7309 Other abnormal glucose: Secondary | ICD-10-CM | POA: Diagnosis not present

## 2024-05-24 DIAGNOSIS — E782 Mixed hyperlipidemia: Secondary | ICD-10-CM | POA: Diagnosis not present

## 2024-05-24 NOTE — Assessment & Plan Note (Signed)
Well controlled.  ?No changes to medicines. lisinopril 40 mg twice daily. Patient has white coat hypertension also. ?Continue to work on eating a healthy diet and exercise.  ?Labs drawn today.  ? ?

## 2024-05-24 NOTE — Progress Notes (Signed)
 Subjective:  Patient ID: Pamela Price, female    DOB: March 10, 1933  Age: 89 y.o. MRN: [id number]  Chief Complaint  Patient presents with   Medical Management of Chronic Issues    HPI: HTN: taking lisinopril  40 mg twice daily  High Cholesterol: taking pravastatin  40mg  daily  Osteoporosis: Prolia  inj every 6 months. Taking calcium with D otc and AREDS vitamins.     06/01/2024    4:01 PM 09/12/2023   10:24 AM 09/09/2022   10:22 AM 09/03/2021    9:38 AM 10/27/2020    9:16 AM  Depression screen PHQ 2/9  Decreased Interest 0 0 0 0 0  Down, Depressed, Hopeless 0  0 0 0  PHQ - 2 Score 0 0 0 0 0  Altered sleeping   0    Tired, decreased energy   0    Change in appetite   0    Feeling bad or failure about yourself    0    Trouble concentrating   0    Moving slowly or fidgety/restless   0    Suicidal thoughts   0    PHQ-9 Score   0    Difficult doing work/chores   Not difficult at all          06/01/2024    4:01 PM  Fall Risk   Falls in the past year? 0  Number falls in past yr: 0  Injury with Fall? 0  Risk for fall due to : No Fall Risks  Follow up Falls evaluation completed;Follow up appointment    Patient Care Team: Sherre Clapper, MD as PCP - General (Family Medicine) Nyle Rankin POUR, [facility] (Inactive) as Pharmacist (Pharmacist)   Review of Systems  Constitutional:  Negative for chills, fatigue and fever.  HENT:  Positive for congestion. Negative for ear pain, rhinorrhea and sore throat.   Respiratory:  Negative for cough and shortness of breath.   Cardiovascular:  Negative for chest pain.  Gastrointestinal:  Negative for abdominal pain, constipation, diarrhea, nausea and vomiting.  Genitourinary:  Negative for dysuria and urgency.  Musculoskeletal:  Negative for back pain and myalgias.  Neurological:  Negative for dizziness, weakness, light-headedness and headaches.  Psychiatric/Behavioral:  Negative for dysphoric mood. The patient is not nervous/anxious.      Current Outpatient Medications on File Prior to Visit  Medication Sig Dispense Refill   Calcium Carbonate-Vitamin D  600-10 MG-MCG TABS Take 1 tablet by mouth daily. 600mg /13mcg     denosumab  (PROLIA ) 60 MG/ML SOSY injection Inject 60 mg into the skin every 6 (six) months.     finasteride (PROSCAR) 5 MG tablet Take by mouth.     lisinopril  (ZESTRIL ) 40 MG tablet TAKE 1 TABLET BY MOUTH TWICE A DAY 180 tablet 1   Multiple Vitamin (MULTIVITAMIN ADULT PO) Take 1 tablet by mouth daily.     Multiple Vitamins-Minerals (PRESERVISION AREDS 2) CAPS Take 1 capsule by mouth 2 (two) times daily.     pravastatin  (PRAVACHOL ) 40 MG tablet TAKE 1 TABLET BY MOUTH EVERYDAY AT BEDTIME 90 tablet 1   No current facility-administered medications on file prior to visit.   Past Medical History:  Diagnosis Date   Adnexal mass 06/30/2021   Arthritis    History of right hemicolectomy    HLD (hyperlipidemia) 06/30/2021   Hypertension 06/30/2021   Left ovarian cyst 07/22/2019   Macular degeneration 06/30/2021   bilateral  gets eye injections   Osteoporosis 06/30/2021   Varicose veins of both  lower extremities 06/30/2021   right leg only   Past Surgical History:  Procedure Laterality Date   BREAST LUMPECTOMY W/ NEEDLE LOCALIZATION     axillary tail of the left  breast    COLON SURGERY  02/2019   for stage I colon cancer   colonscopy  2020   ROBOTIC ASSISTED BILATERAL SALPINGO OOPHERECTOMY N/A 07/14/2021   ROBOTIC ASSISTED TOTAL HYSTERECTOMY WITH BILATERAL SALPINGO OOPHERECTOMY  07/14/2021   TOTAL HIP ARTHROPLASTY Right 08/30/2017   Procedure: RIGHT TOTAL HIP ARTHROPLASTY ANTERIOR APPROACH;  Surgeon: Melodi Lerner, MD;  Location: [facility];  Service: Orthopedics;  Laterality: Right;    Family History  Problem Relation Age of Onset   Cancer Mother    Cancer Father    Social History   Socioeconomic History   Marital status: Widowed    Spouse name: Not on file   Number of children: 2   Years of  education: Not on file   Highest education level: Not on file  Occupational History   Not on file  Tobacco Use   Smoking status: Former    Types: Cigarettes   Smokeless tobacco: Never   Tobacco comments:    Quit 50 years ago   Vaping Use   Vaping status: Never Used  Substance and Sexual Activity   Alcohol use: Yes    Comment: rare wine   Drug use: No   Sexual activity: Not on file  Other Topics Concern   Not on file  Social History Narrative   Not on file   Social Drivers of Health   Financial Resource Strain: Low Risk  (09/12/2023)   Overall Financial Resource Strain (CARDIA)    Difficulty of Paying Living Expenses: Not hard at all  Food Insecurity: No Food Insecurity (09/12/2023)   Hunger Vital Sign    Worried About Running Out of Food in the Last Year: Never true    Ran Out of Food in the Last Year: Never true  Transportation Needs: No Transportation Needs (09/12/2023)   PRAPARE - Administrator, Civil Service (Medical): No    Lack of Transportation (Non-Medical): No  Physical Activity: Sufficiently Active (09/12/2023)   Exercise Vital Sign    Days of Exercise per Week: 5 days    Minutes of Exercise per Session: 30 min  Stress: No Stress Concern Present (09/12/2023)   [employer] of Occupational Health - Occupational Stress Questionnaire    Feeling of Stress : Not at all  Social Connections: Socially Isolated (09/12/2023)   Social Connection and Isolation Panel    Frequency of Communication with Friends and Family: Twice a week    Frequency of Social Gatherings with Friends and Family: Twice a week    Attends Religious Services: Never    Database administrator or Organizations: No    Attends Banker Meetings: Never    Marital Status: Widowed    Objective:  BP 138/60   Pulse 78   Temp 98 F (36.7 C)   Ht 5' 1.5 (1.562 m)   Wt 110 lb (49.9 kg)   SpO2 98%   BMI 20.45 kg/m      05/24/2024   10:50 AM 09/12/2023   10:20 AM  09/09/2022   11:03 AM  BP/Weight  Systolic BP 138 134 140  Diastolic BP 60 58 70  Wt. (Lbs) 110 110   BMI 20.45 kg/m2 20.45 kg/m2     Physical Exam Vitals reviewed.  Constitutional:  Appearance: Normal appearance. She is normal weight.  Neck:     Vascular: No carotid bruit.   Cardiovascular:     Rate and Rhythm: Normal rate and regular rhythm.     Heart sounds: Normal heart sounds.  Pulmonary:     Effort: Pulmonary effort is normal. No respiratory distress.     Breath sounds: Normal breath sounds.  Abdominal:     General: Abdomen is flat. Bowel sounds are normal.     Palpations: Abdomen is soft.     Tenderness: There is no abdominal tenderness.   Neurological:     Mental Status: She is alert and oriented to person, place, and time.   Psychiatric:        Mood and Affect: Mood normal.        Behavior: Behavior normal.         Lab Results  Component Value Date   WBC 8.2 05/24/2024   HGB 13.1 05/24/2024   HCT 40.7 05/24/2024   PLT 240 05/24/2024   GLUCOSE 101 (H) 05/24/2024   CHOL 176 05/24/2024   TRIG 186 (H) 05/24/2024   HDL 59 05/24/2024   LDLCALC 86 05/24/2024   ALT 23 05/24/2024   AST 25 05/24/2024   NA 143 05/24/2024   K 5.6 (H) 05/24/2024   CL 105 05/24/2024   CREATININE 0.78 05/24/2024   BUN 18 05/24/2024   CO2 22 05/24/2024   TSH 1.920 05/24/2024   INR 1.01 08/24/2017      Assessment & Plan:  Primary hypertension Assessment & Plan: Well controlled.  No changes to medicines. lisinopril  40 mg twice daily. Patient has white coat hypertension also. Continue to work on eating a healthy diet and exercise.  Labs drawn today.    Orders: -     CBC with Differential/Platelet -     Comprehensive metabolic panel with GFR  Mixed hyperlipidemia Assessment & Plan: Well controlled.  No changes to medicines. Continue pravastatin  40 mg daily. Continue to work on eating a healthy diet and exercise.  Labs drawn today.    Orders: -     TSH -      Lipid panel  Macular degeneration of both eyes, unspecified type Assessment & Plan: Follow up regularly with opthomology.    Age-related osteoporosis without current pathological fracture Assessment & Plan: Continue prolia , calcium and vitamin d    Memory loss -     B12 and Folate Panel -     Methylmalonic acid, serum     No orders of the defined types were placed in this encounter.   Orders Placed This Encounter  Procedures   B12 and Folate Panel   Methylmalonic acid, serum   CBC with Differential/Platelet   TSH   Comprehensive metabolic panel with GFR   Lipid panel     Follow-up: Return in about 4 months (around 09/16/2024) for awv, chronic follow up.   I,Katherina A Bramblett,acting as a scribe for Abigail Free, MD.,have documented all relevant documentation on the behalf of Abigail Free, MD,as directed by  Abigail Free, MD while in the presence of Abigail Free, MD.   An After Visit Summary was printed and given to the patient.  I attest that I have reviewed this visit and agree with the plan scribed by my staff.   Abigail Free, MD [facility] [phone]

## 2024-05-27 ENCOUNTER — Ambulatory Visit: Payer: Self-pay | Admitting: Family Medicine

## 2024-05-27 LAB — COMPREHENSIVE METABOLIC PANEL WITH GFR
ALT: 23 [IU]/L (ref 0–32)
AST: 25 [IU]/L (ref 0–40)
Albumin: 4.2 g/dL (ref 3.7–4.7)
Alkaline Phosphatase: 71 [IU]/L (ref 44–121)
BUN/Creatinine Ratio: 23 (ref 12–28)
BUN: 18 mg/dL (ref 8–27)
Bilirubin Total: 0.3 mg/dL (ref 0.0–1.2)
CO2: 22 mmol/L (ref 20–29)
Calcium: 9.9 mg/dL (ref 8.7–10.3)
Chloride: 105 mmol/L (ref 96–106)
Creatinine, Ser: 0.78 mg/dL (ref 0.57–1.00)
Globulin, Total: 2.4 g/dL (ref 1.5–4.5)
Glucose: 101 mg/dL — ABNORMAL HIGH (ref 70–99)
Potassium: 5.6 mmol/L — ABNORMAL HIGH (ref 3.5–5.2)
Sodium: 143 mmol/L (ref 134–144)
Total Protein: 6.6 g/dL (ref 6.0–8.5)
eGFR: 73 mL/min/{1.73_m2}

## 2024-05-27 LAB — CBC WITH DIFFERENTIAL/PLATELET
Basophils Absolute: 0.1 10*3/uL (ref 0.0–0.2)
Basos: 1 %
EOS (ABSOLUTE): 0.1 10*3/uL (ref 0.0–0.4)
Eos: 1 %
Hematocrit: 40.7 % (ref 34.0–46.6)
Hemoglobin: 13.1 g/dL (ref 11.1–15.9)
Immature Grans (Abs): 0 10*3/uL (ref 0.0–0.1)
Immature Granulocytes: 0 %
Lymphocytes Absolute: 1.5 10*3/uL (ref 0.7–3.1)
Lymphs: 18 %
MCH: 29.4 pg (ref 26.6–33.0)
MCHC: 32.2 g/dL (ref 31.5–35.7)
MCV: 92 fL (ref 79–97)
Monocytes Absolute: 0.7 10*3/uL (ref 0.1–0.9)
Monocytes: 8 %
Neutrophils Absolute: 5.9 10*3/uL (ref 1.4–7.0)
Neutrophils: 71 %
Platelets: 240 10*3/uL (ref 150–450)
RBC: 4.45 x10E6/uL (ref 3.77–5.28)
RDW: 12.9 % (ref 11.7–15.4)
WBC: 8.2 10*3/uL (ref 3.4–10.8)

## 2024-05-27 LAB — LIPID PANEL
Chol/HDL Ratio: 3 ratio (ref 0.0–4.4)
Cholesterol, Total: 176 mg/dL (ref 100–199)
HDL: 59 mg/dL
LDL Chol Calc (NIH): 86 mg/dL (ref 0–99)
Triglycerides: 186 mg/dL — ABNORMAL HIGH (ref 0–149)
VLDL Cholesterol Cal: 31 mg/dL (ref 5–40)

## 2024-05-27 LAB — B12 AND FOLATE PANEL
Folate: >20 ng/mL
Vitamin B-12: 1043 pg/mL (ref 232–1245)

## 2024-05-27 LAB — TSH: TSH: 1.92 u[IU]/mL (ref 0.450–4.500)

## 2024-05-27 LAB — METHYLMALONIC ACID, SERUM: Methylmalonic Acid: 221 nmol/L (ref 0–378)

## 2024-06-01 NOTE — Assessment & Plan Note (Signed)
Follow up regularly with opthomology.  ?

## 2024-06-01 NOTE — Assessment & Plan Note (Signed)
 Continue prolia , calcium and vitamin d 

## 2024-06-01 NOTE — Assessment & Plan Note (Signed)
Well controlled.  No changes to medicines. Continue pravastatin 40 mg daily.  Continue to work on eating a healthy diet and exercise.  Labs drawn today.   

## 2024-06-03 ENCOUNTER — Other Ambulatory Visit: Payer: Self-pay

## 2024-06-03 DIAGNOSIS — M810 Age-related osteoporosis without current pathological fracture: Secondary | ICD-10-CM

## 2024-06-03 DIAGNOSIS — R899 Unspecified abnormal finding in specimens from other organs, systems and tissues: Secondary | ICD-10-CM

## 2024-06-03 LAB — HGB A1C W/O EAG: Hgb A1c MFr Bld: 5.4 % (ref 4.8–5.6)

## 2024-06-03 LAB — SPECIMEN STATUS REPORT

## 2024-06-03 MED ORDER — DENOSUMAB 60 MG/ML ~~LOC~~ SOSY: 60.0000 mg | PREFILLED_SYRINGE | Freq: Once | SUBCUTANEOUS | Status: DC

## 2024-06-03 MED ORDER — DENOSUMAB 60 MG/ML ~~LOC~~ SOSY: 60.0000 mg | PREFILLED_SYRINGE | SUBCUTANEOUS | 1 refills | Status: AC

## 2024-06-05 ENCOUNTER — Other Ambulatory Visit

## 2024-06-05 ENCOUNTER — Ambulatory Visit

## 2024-06-07 ENCOUNTER — Telehealth: Payer: Self-pay

## 2024-06-07 NOTE — Telephone Encounter (Signed)
 Copied from CRM [id number]. Topic: General - Other >> Jun 07, 2024 10:25 AM Travis F wrote: Reason for CRM: [facility] is calling in to inform the office that patient's Prolia  shipment will be arriving on 06/11/24 in the morning. The package needs to be signed for. Nmizm#:[id number].

## 2024-06-07 NOTE — Telephone Encounter (Signed)
 Patient has call multiple times and has been told each time that her Prolia  RX has been sent [facility] and they will be reaching out to her for approval before they can schedule delivery here at the office.  I called [facility] today at [phone] and was told that they have attempted to reach the patient on the 23rd, 24th, 25th, and 26th with no response.  I called the patient back and gave her the phone number to reach them to give approval.

## 2024-06-12 ENCOUNTER — Ambulatory Visit (INDEPENDENT_AMBULATORY_CARE_PROVIDER_SITE_OTHER)

## 2024-06-12 DIAGNOSIS — M810 Age-related osteoporosis without current pathological fracture: Secondary | ICD-10-CM

## 2024-06-12 DIAGNOSIS — R899 Unspecified abnormal finding in specimens from other organs, systems and tissues: Secondary | ICD-10-CM | POA: Diagnosis not present

## 2024-06-12 LAB — COMPREHENSIVE METABOLIC PANEL WITH GFR
ALT: 14 [IU]/L (ref 0–32)
AST: 17 [IU]/L (ref 0–40)
Albumin: 4.3 g/dL (ref 3.7–4.7)
Alkaline Phosphatase: 74 [IU]/L (ref 44–121)
BUN/Creatinine Ratio: 30 — ABNORMAL HIGH (ref 12–28)
BUN: 22 mg/dL (ref 8–27)
Bilirubin Total: 0.3 mg/dL (ref 0.0–1.2)
CO2: 23 mmol/L (ref 20–29)
Calcium: 9.8 mg/dL (ref 8.7–10.3)
Chloride: 103 mmol/L (ref 96–106)
Creatinine, Ser: 0.74 mg/dL (ref 0.57–1.00)
Globulin, Total: 2.3 g/dL (ref 1.5–4.5)
Glucose: 98 mg/dL (ref 70–99)
Potassium: 5.1 mmol/L (ref 3.5–5.2)
Sodium: 140 mmol/L (ref 134–144)
Total Protein: 6.6 g/dL (ref 6.0–8.5)
eGFR: 78 mL/min/{1.73_m2}

## 2024-06-12 MED ORDER — DENOSUMAB 60 MG/ML ~~LOC~~ SOSY: 60.0000 mg | PREFILLED_SYRINGE | Freq: Once | SUBCUTANEOUS | Status: DC

## 2024-06-12 NOTE — Patient Instructions (Signed)
 Call 30 minutes prior to appointment.

## 2024-06-12 NOTE — Progress Notes (Signed)
   Patient: Pamela Price  DOB: 06/06/1933  MRN: [id number]    Visit Date: 06/12/2024    [name] presents today for her six month Prolia  injection.  1 x 60 mg Single-Dose Prefilled Syringe was given SQ in the left arm.  Patient tolerated the injection well and has no questions.  The next Prolia  injection will be due in six months.      Alecia Doi A Kylon Philbrook, CMA

## 2024-06-12 NOTE — Addendum Note (Signed)
 Addended byBETHA SHERRE CLAPPER on: 06/12/2024 01:04 PM   Modules accepted: Orders

## 2024-06-13 ENCOUNTER — Ambulatory Visit: Payer: Self-pay | Admitting: Family Medicine

## 2024-06-24 DIAGNOSIS — M5090 Cervical disc disorder, unspecified, unspecified cervical region: Secondary | ICD-10-CM | POA: Diagnosis not present

## 2024-06-24 DIAGNOSIS — M2550 Pain in unspecified joint: Secondary | ICD-10-CM | POA: Diagnosis not present

## 2024-06-24 DIAGNOSIS — M5412 Radiculopathy, cervical region: Secondary | ICD-10-CM | POA: Diagnosis not present

## 2024-06-24 DIAGNOSIS — M47812 Spondylosis without myelopathy or radiculopathy, cervical region: Secondary | ICD-10-CM | POA: Diagnosis not present

## 2024-07-02 DIAGNOSIS — M2550 Pain in unspecified joint: Secondary | ICD-10-CM | POA: Diagnosis not present

## 2024-07-04 ENCOUNTER — Ambulatory Visit (HOSPITAL_BASED_OUTPATIENT_CLINIC_OR_DEPARTMENT_OTHER): Admitting: Family Medicine

## 2024-07-04 ENCOUNTER — Encounter (HOSPITAL_BASED_OUTPATIENT_CLINIC_OR_DEPARTMENT_OTHER): Payer: Self-pay | Admitting: Family Medicine

## 2024-07-04 VITALS — BP 115/65 | HR 102 | Temp 99.0°F | Resp 16 | Ht 61.42 in | Wt 109.8 lb

## 2024-07-04 DIAGNOSIS — I10 Essential (primary) hypertension: Secondary | ICD-10-CM | POA: Diagnosis not present

## 2024-07-04 DIAGNOSIS — H918X9 Other specified hearing loss, unspecified ear: Secondary | ICD-10-CM

## 2024-07-04 DIAGNOSIS — M1610 Unilateral primary osteoarthritis, unspecified hip: Secondary | ICD-10-CM

## 2024-07-04 NOTE — Progress Notes (Signed)
 Established Patient Office Visit  Subjective   Patient ID: Pamela Price, female    DOB: 02-Jul-1933  Age: 89 y.o. MRN: [id number]  Chief Complaint  Patient presents with   Establish Care    Establish care     F/u as above.  Known to me from [facility] years ago.  Overall doing relatively well, but recently noted to have dramatically worsening diffuse arthralgias and myalgias when she wakes up.  Improves by mid day.  Improved nicely on Prednisone per Ortho.  Mobic  not helpful.  Not in a PMR pattern.  Continues to live alone and remains quite independent.    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/30/2021   right leg only    Outpatient Encounter Medications as of 07/04/2024  Medication Sig   Calcium Carbonate-Vitamin D  600-10 MG-MCG TABS Take 1 tablet by mouth daily. 600mg /48mcg   denosumab  (PROLIA ) 60 MG/ML SOSY injection Inject 60 mg into the skin every 6 (six) months.   finasteride (PROSCAR) 5 MG tablet Take by mouth.   lisinopril  (ZESTRIL ) 40 MG tablet TAKE 1 TABLET BY MOUTH TWICE A DAY   meloxicam  (MOBIC ) 15 MG tablet Take 15 mg by mouth daily.   Multiple Vitamin (MULTIVITAMIN ADULT PO) Take 1 tablet by mouth daily.   Multiple Vitamins-Minerals (PRESERVISION AREDS 2) CAPS Take 1 capsule by mouth 2 (two) times daily.   pravastatin  (PRAVACHOL ) 40 MG tablet TAKE 1 TABLET BY MOUTH EVERYDAY AT BEDTIME   [DISCONTINUED] methylPREDNISolone (MEDROL DOSEPAK) 4 MG TBPK tablet SMARTSIG:- Tablet(s) By Mouth -   Facility-Administered Encounter Medications as of 07/04/2024  Medication   denosumab  (PROLIA ) injection 60 mg    Social History   Tobacco Use   Smoking status:  Former    Types: Cigarettes   Smokeless tobacco: Never   Tobacco comments:    Quit 50 years ago   Vaping Use   Vaping status: Never Used  Substance Use Topics   Alcohol use: Yes    Comment: rare wine   Drug use: No      Review of Systems  Constitutional:  Negative for diaphoresis, fever, malaise/fatigue and weight loss.  Respiratory:  Negative for cough, shortness of breath and wheezing.   Cardiovascular:  Negative for chest pain, palpitations, orthopnea, claudication, leg swelling and PND.      Objective:     BP 115/65 (BP Location: Right Arm, Patient Position: Standing, Cuff Size: Normal)   Pulse (!) 102   Temp 99 F (37.2 C) (Oral)   Resp 16   Ht 5' 1.42 (1.56 m)   Wt 109 lb 12.8 oz (49.8 kg)   SpO2 99%   BMI 20.47 kg/m    Physical Exam Constitutional:      General: She is not in acute distress.    Appearance: Normal appearance.  HENT:     Head: Normocephalic.  Neck:     Vascular: No carotid bruit.  Cardiovascular:     Rate and Rhythm: Normal rate and regular rhythm.     Pulses: Normal pulses.     Heart sounds: Normal heart sounds.  Pulmonary:     Effort: Pulmonary effort is normal.  Breath sounds: Normal breath sounds.  Abdominal:     General: Bowel sounds are normal.     Palpations: Abdomen is soft.  Musculoskeletal:     Cervical back: Neck supple. No tenderness.     Right lower leg: No edema.     Left lower leg: No edema.  Neurological:     Mental Status: She is alert.      No results found for any visits on 07/04/24.    The ASCVD Risk score (Arnett DK, et al., 2019) failed to calculate for the following reasons:   The 2019 ASCVD risk score is only valid for ages 8 to 8    Assessment & Plan:  Primary hypertension Assessment & Plan: Hold her Pravastatin  for now to see if her dramatic arthralgias and myalgias improve in the coming days.  Request and continue to review old records.  Arrange for close follow up until we get to know  her better.   Primary osteoarthritis of hip, unspecified laterality  Other specified forms of hearing loss, unspecified laterality    Return in about 2 weeks (around 07/18/2024) for chronic follow-up.    REDDING PONCE NORLEEN FALCON., MD

## 2024-07-04 NOTE — Assessment & Plan Note (Addendum)
 Hold her Pravastatin  for now to see if her dramatic arthralgias and myalgias improve in the coming days.  Request and continue to review old records.  Arrange for close follow up until we get to know her better.

## 2024-07-07 ENCOUNTER — Encounter (HOSPITAL_BASED_OUTPATIENT_CLINIC_OR_DEPARTMENT_OTHER): Payer: Self-pay | Admitting: Emergency Medicine

## 2024-07-07 ENCOUNTER — Ambulatory Visit (HOSPITAL_BASED_OUTPATIENT_CLINIC_OR_DEPARTMENT_OTHER)
Admission: EM | Admit: 2024-07-07 | Discharge: 2024-07-07 | Disposition: A | Discharge status: Home / Self Care | Attending: Family Medicine | Admitting: Family Medicine

## 2024-07-07 DIAGNOSIS — R5383 Other fatigue: Secondary | ICD-10-CM | POA: Diagnosis not present

## 2024-07-07 DIAGNOSIS — M2550 Pain in unspecified joint: Secondary | ICD-10-CM | POA: Diagnosis not present

## 2024-07-07 MED ORDER — CYCLOBENZAPRINE HCL 5 MG PO TABS: 5.0000 mg | ORAL_TABLET | Freq: Every evening | ORAL | 0 refills | Status: DC | PRN

## 2024-07-07 MED ORDER — CELECOXIB 200 MG PO CAPS: 200.0000 mg | ORAL_CAPSULE | Freq: Two times a day (BID) | ORAL | 0 refills | Status: DC | PRN

## 2024-07-07 NOTE — Discharge Instructions (Signed)
 Pain and discomfort of multiple joints with fatigue: A large amount of blood work is pending and will adjust the plan of care, if needed once the labs result.  Chemistry panel from 06/12/24 indicates good kidney function.  Stop meloxicam .  Celebrex  200 mg, 1 pill twice daily for arthritic pain.  Use the Celebrex  twice daily for at least 1 to 2 weeks.  Cyclobenzaprine , 5 mg 1 pill nightly for muscle spasms.  Do not use the cyclobenzaprine  and drive.  Do not use Advil , Motrin , Ibuprofen , Aleve, Naproxen Sodium (NSAIDS) for the next 1 to 2 weeks due to recent use of oral steroids (given by Dr. Erica).  May use Tylenol /Acetaminophen , 500 mg,1-1.5 pills, every 6 hours as needed for pain.   Make an appointment with Dr. Dottie for follow-up and further workup of arthritic or joint pains.  May need to see rheumatologist if problems persist.

## 2024-07-07 NOTE — ED Triage Notes (Signed)
 Pt reports she seen Dr. Dottie on 07/24, for the pain he stopped her Pravastatin  to see if her pain improved  pt seen Dr. Erica 2 weeks and he prescribed a steroid pack and she felt a lot better. Pt c/o all over pain mainly on her right side all the way down.

## 2024-07-07 NOTE — ED Provider Notes (Signed)
 [facility]    CSN: [id number] Arrival date & time: 07/07/24  1136      History   Chief Complaint Chief Complaint  Patient presents with   Joint Pain    HPI Pamela Price is a 89 y.o. female.   Pt reports she seen Dr. Dottie on 07/24, for the pain he stopped her Pravastatin  to see if her pain improved  pt seen Dr. Erica 2 weeks and he prescribed a steroid pack and she felt a lot better. Pt c/o all over pain mainly on her right side all the way down.      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/30/2021   right leg only    Patient Active Problem List   Diagnosis Date Noted   Encounter for Medicare annual wellness exam 09/16/2023   Hearing loss 09/09/2022   Macular degeneration of both eyes 09/28/2021   Age-related osteoporosis without current pathological fracture 09/28/2021   Primary hypertension 09/03/2021   Mixed hyperlipidemia 09/03/2021   Osteoarthritis 08/30/2017    Past Surgical History:  Procedure Laterality Date   BREAST LUMPECTOMY W/ NEEDLE LOCALIZATION     axillary tail of the left  breast    COLON SURGERY  02/2019   for stage I colon cancer   colonscopy  2020   ROBOTIC ASSISTED BILATERAL SALPINGO OOPHERECTOMY N/A 07/14/2021   ROBOTIC ASSISTED TOTAL HYSTERECTOMY WITH BILATERAL SALPINGO OOPHERECTOMY  07/14/2021   TOTAL HIP ARTHROPLASTY Right 08/30/2017   Procedure: RIGHT TOTAL HIP ARTHROPLASTY ANTERIOR APPROACH;  Surgeon: Melodi Lerner, MD;  Location: [facility];  Service: Orthopedics;  Laterality: Right;    OB History   No obstetric history on file.      Home Medications    Prior to Admission medications   Medication Sig  Start Date End Date Taking? Authorizing Provider  celecoxib  (CELEBREX ) 200 MG capsule Take 1 capsule (200 mg total) by mouth every 12 (twelve) hours as needed (with food as needed for arthritic or joint pain). 07/07/24 08/06/24 Yes Ival Domino, FNP  cyclobenzaprine  (FLEXERIL ) 5 MG tablet Take 1 tablet (5 mg total) by mouth at bedtime as needed for up to 15 days for muscle spasms. 07/07/24 07/22/24 Yes Ival Domino, FNP  lisinopril  (ZESTRIL ) 40 MG tablet TAKE 1 TABLET BY MOUTH TWICE A DAY 05/20/24  Yes Cox, Kirsten, MD  Calcium Carbonate-Vitamin D  600-10 MG-MCG TABS Take 1 tablet by mouth daily. 600mg /42mcg    [provider]  denosumab  (PROLIA ) 60 MG/ML SOSY injection Inject 60 mg into the skin every 6 (six) months. 06/03/24   Sherre Clapper, MD  finasteride (PROSCAR) 5 MG tablet Take by mouth. 09/04/22   [provider]  Multiple Vitamin (MULTIVITAMIN ADULT PO) Take 1 tablet by mouth daily.    [provider]  Multiple Vitamins-Minerals (PRESERVISION AREDS 2) CAPS Take 1 capsule by mouth 2 (two) times daily.    [provider]  pravastatin  (PRAVACHOL ) 40 MG tablet TAKE 1 TABLET BY MOUTH EVERYDAY AT BEDTIME 02/27/24   CoxClapper, MD    Family History Family History  Problem Relation Age of Onset   Cancer Mother    Cancer Father  Social History Social History   Tobacco Use   Smoking status: Former    Types: Cigarettes   Smokeless tobacco: Never   Tobacco comments:    Quit 50 years ago   Vaping Use   Vaping status: Never Used  Substance Use Topics   Alcohol use: Yes    Comment: rare wine   Drug use: No     Allergies   Sulfa antibiotics   Review of Systems Review of Systems   Physical Exam Triage Vital Signs ED Triage Vitals  Encounter Vitals Group     BP 07/07/24 1157 (!) 151/70     Girls Systolic BP Percentile --      Girls Diastolic BP Percentile --      Boys Systolic BP Percentile --      Boys Diastolic BP Percentile --       Pulse Rate 07/07/24 1157 73     Resp 07/07/24 1157 18     Temp 07/07/24 1157 97.6 F (36.4 C)     Temp Source 07/07/24 1157 Oral     SpO2 07/07/24 1157 96 %     Weight --      Height --      Head Circumference --      Peak Flow --      Pain Score 07/07/24 1156 5     Pain Loc --      Pain Education --      Exclude from Growth Chart --    No data found.  Updated Vital Signs BP (!) 151/70 (BP Location: Left Arm)   Pulse 73   Temp 97.6 F (36.4 C) (Oral)   Resp 18   SpO2 96%   Visual Acuity Right Eye Distance:   Left Eye Distance:   Bilateral Distance:    Right Eye Near:   Left Eye Near:    Bilateral Near:     Physical Exam   UC Treatments / Results  Labs (all labs ordered are listed, but only abnormal results are displayed)  Contains abnormal data Comprehensive metabolic panel with GFR: 06/12/24:     Component Ref Range & Units (hover) 3 wk ago (06/12/24)  Glucose 98  BUN 22  Creatinine, Ser 0.74  eGFR 78  BUN/Creatinine Ratio 30 High   Sodium 140  Potassium 5.1  Chloride 103  CO2 23  Calcium 9.8  Total Protein 6.6  Albumin 4.3  Globulin, Total 2.3  Bilirubin Total 0.3  Alkaline Phosphatase 74  AST 17  ALT 14  Resulting Agency [facility]      EKG   Radiology No results found.  Procedures Procedures (including critical care time)  Medications Ordered in UC Medications - No data to display  Initial Impression / Assessment and Plan / UC Course  I have reviewed the triage vital signs and the nursing notes.  Pertinent labs & imaging results that were available during my care of the patient were reviewed by me and considered in my medical decision making (see chart for details).  Plan of Care: Arthralgia in multiple joints and fatigue: Stop the meloxicam .  Celebrex  200 mg twice daily and continue it twice daily for 1 to 3 weeks.  Labs are pending and will adjust the plan of care, if needed once the labs result.  See discharge instructions for  specific patient instructions.  Advised against use of NSAIDs while taking Celebrex  and after recent steroid use.  Follow-up with Dr. Dottie for further evaluation and management of symptoms.  I reviewed the plan of care with the patient and/or the patient's guardian.  The patient and/or guardian had time to ask questions and acknowledged that the questions were answered.  I provided instruction on symptoms or reasons to return here or to go to an ER, if symptoms/condition did not improve, worsened or if new symptoms occurred.  Final Clinical Impressions(s) / UC Diagnoses   Final diagnoses:  Arthralgia of multiple joints  Other fatigue     Discharge Instructions      Pain and discomfort of multiple joints with fatigue: A large amount of blood work is pending and will adjust the plan of care, if needed once the labs result.  Chemistry panel from 06/12/24 indicates good kidney function.  Stop meloxicam .  Celebrex  200 mg, 1 pill twice daily for arthritic pain.  Use the Celebrex  twice daily for at least 1 to 2 weeks.  Cyclobenzaprine , 5 mg 1 pill nightly for muscle spasms.  Do not use the cyclobenzaprine  and drive.  Do not use Advil , Motrin , Ibuprofen , Aleve, Naproxen Sodium (NSAIDS) for the next 1 to 2 weeks due to recent use of oral steroids (given by Dr. Erica).  May use Tylenol /Acetaminophen , 500 mg,1-1.5 pills, every 6 hours as needed for pain.   Make an appointment with Dr. Dottie for follow-up and further workup of arthritic or joint pains.  May need to see rheumatologist if problems persist.     ED Prescriptions     Medication Sig Dispense Auth. Provider   cyclobenzaprine  (FLEXERIL ) 5 MG tablet Take 1 tablet (5 mg total) by mouth at bedtime as needed for up to 15 days for muscle spasms. 15 tablet Xochitl Egle, FNP   celecoxib  (CELEBREX ) 200 MG capsule Take 1 capsule (200 mg total) by mouth every 12 (twelve) hours as needed (with food as needed for arthritic or joint pain). 60 capsule  Ival Domino, FNP      PDMP not reviewed this encounter.   Ival Domino, FNP 07/07/24 [phone]

## 2024-07-09 ENCOUNTER — Ambulatory Visit: Payer: Self-pay

## 2024-07-09 ENCOUNTER — Other Ambulatory Visit (HOSPITAL_BASED_OUTPATIENT_CLINIC_OR_DEPARTMENT_OTHER): Payer: Self-pay | Admitting: Family Medicine

## 2024-07-09 DIAGNOSIS — M542 Cervicalgia: Secondary | ICD-10-CM

## 2024-07-09 MED ORDER — OXYCODONE HCL 5 MG PO TABS: 5.0000 mg | ORAL_TABLET | Freq: Four times a day (QID) | ORAL | 0 refills | Status: DC | PRN

## 2024-07-09 NOTE — Telephone Encounter (Addendum)
 FYI Only or Action Required?: FYI only for provider.  Patient was last seen in primary care on 07/04/2024 by Dottie Norleen [name], MD.  Called Nurse Triage reporting No chief complaint on file..  Symptoms began several days ago.  Interventions attempted: OTC medications: Tylenol  650 mg.  Symptoms are: unchanged.  Triage Disposition: See Physician Within 24 Hours  Patient/caregiver understands and will follow disposition?: Yes   Copied from CRM [id number]. Topic: Clinical - Red Word Triage >> Jul 09, 2024  9:12 AM Pamela Price wrote: Red Word that prompted transfer to Nurse Triage: Pt is having serve pain right side neck down to her hand . On 7/27 pt was seen at [facility]  Reason for Disposition  Numbness in an arm or hand (i.e., loss of sensation)  Answer Assessment - Initial Assessment Questions 1. ONSET: When did the pain begin?      Last Thursday  2. LOCATION: Where does it hurt?      Right side of the neck  3. PATTERN Does the pain come and go, or has it been constant since it started?      Constant  4. SEVERITY: How bad is the pain?  (Scale 0-10; or none or slight stiffness, mild, moderate, severe)     Moderate to Severe  5. RADIATION: Does the pain go anywhere else, shoot into your arms?     Down into the arm (right), hip (right)  6. CORD SYMPTOMS: Any weakness or numbness of the arms or legs?     Intermittent numbness to the right arm  7. CAUSE: What do you think is causing the neck pain?     Unsure  8. NECK OVERUSE: Any recent activities that involved turning or twisting the neck?     No  9. OTHER SYMPTOMS: Do you have any other symptoms? (e.g., headache, fever, chest pain, difficulty breathing, neck swelling)     Anxiety  10. PREGNANCY: Is there any chance you are pregnant? When was your last menstrual period?       No and No  Protocols used: Neck Pain or Stiffness-A-AH

## 2024-07-10 ENCOUNTER — Ambulatory Visit (HOSPITAL_BASED_OUTPATIENT_CLINIC_OR_DEPARTMENT_OTHER): Admitting: Family Medicine

## 2024-07-10 ENCOUNTER — Encounter (HOSPITAL_BASED_OUTPATIENT_CLINIC_OR_DEPARTMENT_OTHER): Payer: Self-pay

## 2024-07-10 ENCOUNTER — Encounter (HOSPITAL_BASED_OUTPATIENT_CLINIC_OR_DEPARTMENT_OTHER): Payer: Self-pay | Admitting: Family Medicine

## 2024-07-10 VITALS — BP 155/68 | HR 85 | Temp 98.2°F | Resp 18 | Wt 110.9 lb

## 2024-07-10 DIAGNOSIS — M1710 Unilateral primary osteoarthritis, unspecified knee: Secondary | ICD-10-CM | POA: Diagnosis not present

## 2024-07-10 DIAGNOSIS — I10 Essential (primary) hypertension: Secondary | ICD-10-CM

## 2024-07-10 DIAGNOSIS — G542 Cervical root disorders, not elsewhere classified: Secondary | ICD-10-CM | POA: Insufficient documentation

## 2024-07-10 LAB — COMPREHENSIVE METABOLIC PANEL WITH GFR
ALT: 16 [IU]/L (ref 0–32)
AST: 15 [IU]/L (ref 0–40)
Albumin: 4 g/dL (ref 3.7–4.7)
Alkaline Phosphatase: 72 [IU]/L (ref 44–121)
BUN/Creatinine Ratio: 53 — ABNORMAL HIGH (ref 12–28)
BUN: 31 mg/dL — ABNORMAL HIGH (ref 8–27)
Bilirubin Total: 0.2 mg/dL (ref 0.0–1.2)
CO2: 22 mmol/L (ref 20–29)
Calcium: 9.2 mg/dL (ref 8.7–10.3)
Chloride: 103 mmol/L (ref 96–106)
Creatinine, Ser: 0.58 mg/dL (ref 0.57–1.00)
Globulin, Total: 2.6 g/dL (ref 1.5–4.5)
Glucose: 103 mg/dL — ABNORMAL HIGH (ref 70–99)
Potassium: 4.3 mmol/L (ref 3.5–5.2)
Sodium: 138 mmol/L (ref 134–144)
Total Protein: 6.6 g/dL (ref 6.0–8.5)
eGFR: 87 mL/min/{1.73_m2}

## 2024-07-10 LAB — CBC WITH DIFFERENTIAL/PLATELET
Basophils Absolute: 0 10*3/uL (ref 0.0–0.2)
Basos: 0 %
EOS (ABSOLUTE): 0.1 10*3/uL (ref 0.0–0.4)
Eos: 1 %
Hematocrit: 33.4 % — ABNORMAL LOW (ref 34.0–46.6)
Hemoglobin: 10.6 g/dL — ABNORMAL LOW (ref 11.1–15.9)
Immature Grans (Abs): 0 10*3/uL (ref 0.0–0.1)
Immature Granulocytes: 0 %
Lymphocytes Absolute: 0.8 10*3/uL (ref 0.7–3.1)
Lymphs: 12 %
MCH: 28.7 pg (ref 26.6–33.0)
MCHC: 31.7 g/dL (ref 31.5–35.7)
MCV: 91 fL (ref 79–97)
Monocytes Absolute: 0.6 10*3/uL (ref 0.1–0.9)
Monocytes: 9 %
Neutrophils Absolute: 5.4 10*3/uL (ref 1.4–7.0)
Neutrophils: 77 %
Platelets: 291 10*3/uL (ref 150–450)
RBC: 3.69 x10E6/uL — ABNORMAL LOW (ref 3.77–5.28)
RDW: 12.4 % (ref 11.7–15.4)
WBC: 7 10*3/uL (ref 3.4–10.8)

## 2024-07-10 LAB — TSH: TSH: 1.45 u[IU]/mL (ref 0.450–4.500)

## 2024-07-10 LAB — CYCLIC CITRUL PEPTIDE ANTIBODY, IGG/IGA: Cyclic Citrullin Peptide Ab: 8 U (ref 0–19)

## 2024-07-10 LAB — SEDIMENTATION RATE

## 2024-07-10 LAB — ANA W/REFLEX IF POSITIVE: Anti Nuclear Antibody (ANA): NEGATIVE

## 2024-07-10 LAB — URIC ACID: Uric Acid: 2.2 mg/dL — ABNORMAL LOW (ref 3.1–7.9)

## 2024-07-10 LAB — RHEUMATOID FACTOR: Rheumatoid fact SerPl-aCnc: 13.2 [IU]/mL

## 2024-07-10 LAB — T4, FREE: Free T4: 1.06 ng/dL (ref 0.82–1.77)

## 2024-07-10 MED ORDER — PREDNISONE 20 MG PO TABS: 20.0000 mg | ORAL_TABLET | Freq: Every day | ORAL | 0 refills | Status: DC

## 2024-07-10 NOTE — Progress Notes (Signed)
 Established Patient Office Visit  Subjective   Patient ID: Pamela Price, female    DOB: 03/13/1933  Age: 89 y.o. MRN: [id number]  Chief Complaint  Patient presents with   neck and arm pain     Neck and arm pain     F/u as above.  See recent notes for details.  Her likely nerve impingement is worsening and failing conservative mgmt.  Prednisone  offered brief relief and NSAIDS have essentially failed.  Already known to Ortho locally.  Pain seems focused in her right neck with radiations down her RUE.  She is a challenging historian but seems focused in her neck most of all.    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 600mg /71mcg   denosumab  (PROLIA ) 60 MG/ML SOSY injection Inject 60 mg into the skin every 6 (six) months.   finasteride (PROSCAR) 5 MG tablet Take by mouth.   lisinopril  (ZESTRIL ) 40 MG tablet TAKE 1 TABLET BY MOUTH TWICE A DAY   Multiple Vitamin (MULTIVITAMIN ADULT PO) Take 1 tablet by mouth daily.   Multiple Vitamins-Minerals (PRESERVISION AREDS 2) CAPS Take 1 capsule by mouth 2 (two) times daily.   oxyCODONE  (ROXICODONE ) 5 MG immediate release tablet Take 1 tablet (5 mg total) by mouth every 6 (six) hours as needed for severe pain (pain score 7-10) (Sedation precautions and avoid alcohol).   pravastatin  (PRAVACHOL ) 40 MG tablet TAKE 1 TABLET BY MOUTH EVERYDAY AT BEDTIME   predniSONE  (DELTASONE ) 20 MG tablet Take 1 tablet (20 mg total) by mouth daily with breakfast.   [DISCONTINUED]  celecoxib  (CELEBREX ) 200 MG capsule Take 1 capsule (200 mg total) by mouth every 12 (twelve) hours as needed (with food as needed for arthritic or joint pain).   [DISCONTINUED] cyclobenzaprine  (FLEXERIL ) 5 MG tablet Take 1 tablet (5 mg total) by mouth at bedtime as needed for up to 15 days for muscle spasms.   [DISCONTINUED] denosumab  (PROLIA ) injection 60 mg    No facility-administered encounter medications on file as of 07/10/2024.    Social History   Tobacco Use   Smoking status: Former    Types: Cigarettes   Smokeless tobacco: Never   Tobacco comments:    Quit 50 years ago   Vaping Use   Vaping status: Never Used  Substance Use Topics   Alcohol use: Yes    Comment: rare wine   Drug use: No      Review of Systems  Constitutional:  Positive for malaise/fatigue. Negative for fever.  HENT:  Negative for hearing loss.   Cardiovascular:  Negative for chest pain.  Gastrointestinal:  Negative for abdominal pain.  Skin:  Negative for rash.  Neurological:  Negative for dizziness, tingling, tremors, sensory change, focal weakness, seizures, loss of consciousness and headaches.  Psychiatric/Behavioral:  Negative for depression, hallucinations and memory loss. The patient is nervous/anxious. The patient does not have insomnia.       Objective:     BP (!) 155/68 (BP Location: Right Arm, Patient Position:  Sitting, Cuff Size: Normal)   Pulse 85   Temp 98.2 F (36.8 C) (Oral)   Resp 18   Wt 110 lb 14.4 oz (50.3 kg)   SpO2 97%   BMI 20.67 kg/m    Physical Exam Constitutional:      General: She is not in acute distress.    Appearance: Normal appearance.     Comments: Nontoxic.  Moderately uncomfortable and anxious.  HENT:     Head: Normocephalic.  Neck:     Comments: Mild to mod right posterolateral discomfort. Cardiovascular:     Rate and Rhythm: Normal rate and regular rhythm.     Pulses: Normal pulses.     Heart sounds: Normal heart sounds.  Pulmonary:     Effort:  Pulmonary effort is normal.     Breath sounds: Normal breath sounds.  Abdominal:     General: Bowel sounds are normal.     Palpations: Abdomen is soft.  Musculoskeletal:     Cervical back: Neck supple. Tenderness present.     Right lower leg: No edema.     Left lower leg: No edema.  Neurological:     General: No focal deficit present.     Mental Status: She is alert.     Coordination: Coordination normal.      No results found for any visits on 07/10/24.    The ASCVD Risk score (Arnett DK, et al., 2019) failed to calculate for the following reasons:   The 2019 ASCVD risk score is only valid for ages 51 to 60    Assessment & Plan:  Cervical nerve root impingement Assessment & Plan: Her son is present and very helpful today.  They both state the recent Prednisone  was quite helpful.  Refer for PT promptly.  Refer to spine surgeon as an injection may need to be considered.  Her son will update me at least weekly, so I can adjust her Prednisone  prn.  They will monitor her home standing BP carefully and understand the Prednisone  side effects.  I'm confident her BP will drop some with better pain relief.  Possible MRI deferred to specialist.  Her son will remove the Oxycodone  and Flexeril  from her home to be sure she doesn't take them accidentally.  NSAIDS already removed.  Orders: -     Ambulatory referral to Spine Surgery -     predniSONE ; Take 1 tablet (20 mg total) by mouth daily with breakfast.  Dispense: 14 tablet; Refill: 0 -     Ambulatory referral to Physical Therapy  Primary osteoarthritis of knee, unspecified laterality  Primary hypertension    No follow-ups on file.    REDDING PONCE NORLEEN FALCON., MD

## 2024-07-10 NOTE — Assessment & Plan Note (Addendum)
 Her son is present and very helpful today.  They both state the recent Prednisone  was quite helpful.  Refer for PT promptly.  Refer to spine surgeon as an injection may need to be considered.  Her son will update me at least weekly, so I can adjust her Prednisone  prn.  They will monitor her home standing BP carefully and understand the Prednisone  side effects.  I'm confident her BP will drop some with better pain relief.  Possible MRI deferred to specialist.  Her son will remove the Oxycodone  and Flexeril  from her home to be sure she doesn't take them accidentally.  NSAIDS already removed.

## 2024-07-11 ENCOUNTER — Ambulatory Visit (HOSPITAL_COMMUNITY): Payer: Self-pay

## 2024-07-11 NOTE — Progress Notes (Signed)
 Arthritis panel and thyroid  labs are all normal.  Chemistry panel is essentially normal with good kidney function.  CBC with differential shows anemia but is otherwise normal.  Hemoglobin was 10.6 and it was 13.1 in June 2025.  Follow-up with Dr. Dottie as planned.  Patient updated about these results personally and I spent 10 minutes on the phone with her going over her current medications and plan of care from her last visit with Dr. Dottie.

## 2024-07-12 ENCOUNTER — Ambulatory Visit: Payer: Self-pay

## 2024-07-12 ENCOUNTER — Encounter (HOSPITAL_BASED_OUTPATIENT_CLINIC_OR_DEPARTMENT_OTHER): Payer: Self-pay | Admitting: Family Medicine

## 2024-07-12 NOTE — Telephone Encounter (Signed)
 FYI Only or Action Required?: Action required by provider: update on patient condition.  Patient was last seen in primary care on 07/10/2024 by Dottie Norleen [name], MD.  Called Nurse Triage reporting Hypertension.  Symptoms began has been elevated for a few days.  Interventions attempted: Other: nothing, patient has been taken off of her blood pressure medication.  Symptoms are: unchanged.  Triage Disposition: See PCP When Office is Open (Within 3 Days)  Patient/caregiver understands and will follow disposition?: Yes      Copied from CRM [id number]. Topic: Clinical - Red Word Triage >> Jul 12, 2024 11:21 AM Marissa P wrote: Red Word that prompted transfer to Nurse Triage: Patients blood pressure has been high for a few days now, she was taken off the blood pressure medicine and is concerned its been reading around 165. Patient was told to keep doctor informed with her blood pressure.       Reason for Disposition  Systolic BP >= 160 OR Diastolic >= 100  Answer Assessment - Initial Assessment Questions Patient would like a call back about her blood pressure when able. Please advise.      1. BLOOD PRESSURE: What is your blood pressure? Did you take at least two measurements 5 minutes apart?     165/91 2. ONSET: When did you take your blood pressure?     Today  3. HOW: How did you take your blood pressure? (e.g., automatic home BP monitor, visiting nurse)     Automatic BP cuff  4. HISTORY: Do you have a history of high blood pressure?     Yes 5. MEDICINES: Are you taking any medicines for blood pressure? Have you missed any doses recently?     Has been taken off medication recently  6. OTHER SYMPTOMS: Do you have any symptoms? (e.g., blurred vision, chest pain, difficulty breathing, headache, weakness)     Neck pain, denies any cardiac or neurologic symptoms  Protocols used: Blood Pressure - High-A-AH

## 2024-07-13 ENCOUNTER — Other Ambulatory Visit: Payer: Self-pay | Admitting: Internal Medicine

## 2024-07-13 ENCOUNTER — Encounter (HOSPITAL_COMMUNITY): Payer: Self-pay

## 2024-07-13 ENCOUNTER — Observation Stay (HOSPITAL_COMMUNITY)
Admission: EM | Admit: 2024-07-13 | Discharge: 2024-07-16 | Disposition: A | Discharge status: Home / Self Care | Source: Other Acute Inpatient Hospital | Attending: Internal Medicine | Admitting: Internal Medicine

## 2024-07-13 DIAGNOSIS — R531 Weakness: Secondary | ICD-10-CM | POA: Insufficient documentation

## 2024-07-13 DIAGNOSIS — M542 Cervicalgia: Secondary | ICD-10-CM | POA: Diagnosis not present

## 2024-07-13 DIAGNOSIS — I10 Essential (primary) hypertension: Secondary | ICD-10-CM | POA: Diagnosis present

## 2024-07-13 DIAGNOSIS — D649 Anemia, unspecified: Secondary | ICD-10-CM | POA: Insufficient documentation

## 2024-07-13 DIAGNOSIS — E782 Mixed hyperlipidemia: Secondary | ICD-10-CM | POA: Diagnosis not present

## 2024-07-13 DIAGNOSIS — I2510 Atherosclerotic heart disease of native coronary artery without angina pectoris: Secondary | ICD-10-CM | POA: Diagnosis not present

## 2024-07-13 DIAGNOSIS — M899 Disorder of bone, unspecified: Secondary | ICD-10-CM | POA: Insufficient documentation

## 2024-07-13 DIAGNOSIS — M810 Age-related osteoporosis without current pathological fracture: Secondary | ICD-10-CM | POA: Diagnosis not present

## 2024-07-13 DIAGNOSIS — C801 Malignant (primary) neoplasm, unspecified: Secondary | ICD-10-CM | POA: Diagnosis not present

## 2024-07-13 DIAGNOSIS — R911 Solitary pulmonary nodule: Secondary | ICD-10-CM | POA: Diagnosis not present

## 2024-07-13 DIAGNOSIS — R9389 Abnormal findings on diagnostic imaging of other specified body structures: Secondary | ICD-10-CM | POA: Diagnosis not present

## 2024-07-13 DIAGNOSIS — R162 Hepatomegaly with splenomegaly, not elsewhere classified: Secondary | ICD-10-CM | POA: Insufficient documentation

## 2024-07-13 DIAGNOSIS — R937 Abnormal findings on diagnostic imaging of other parts of musculoskeletal system: Principal | ICD-10-CM | POA: Diagnosis present

## 2024-07-13 DIAGNOSIS — G542 Cervical root disorders, not elsewhere classified: Secondary | ICD-10-CM

## 2024-07-13 DIAGNOSIS — R9089 Other abnormal findings on diagnostic imaging of central nervous system: Secondary | ICD-10-CM | POA: Diagnosis not present

## 2024-07-13 DIAGNOSIS — R29898 Other symptoms and signs involving the musculoskeletal system: Secondary | ICD-10-CM | POA: Diagnosis not present

## 2024-07-13 DIAGNOSIS — M47812 Spondylosis without myelopathy or radiculopathy, cervical region: Secondary | ICD-10-CM | POA: Insufficient documentation

## 2024-07-13 DIAGNOSIS — R52 Pain, unspecified: Secondary | ICD-10-CM | POA: Diagnosis present

## 2024-07-13 DIAGNOSIS — D75839 Thrombocytosis, unspecified: Secondary | ICD-10-CM | POA: Diagnosis not present

## 2024-07-13 DIAGNOSIS — K5730 Diverticulosis of large intestine without perforation or abscess without bleeding: Secondary | ICD-10-CM | POA: Diagnosis not present

## 2024-07-13 NOTE — H&P (Signed)
 History and Physical    Pamela Price FMW:[id number] DOB: Apr 29, 1933 DOA: 07/13/2024  PCP: Dottie Norleen [name], MD  Patient coming from: Home  I have personally briefly reviewed patient's old medical records in [facility]  Chief Complaint: Generalized body pain and weakness from neck down  HPI: Pamela Price is a 89 y.o. female with medical history significant for HTN, HLD, osteoporosis who presented to the [facility] ED for evaluation of generalized pain and weakness from neck down.  Patient states that for the last 3 weeks she has had generalized body pain and weakness from her neck down, primarily arthralgias and myalgias.  She has been evaluated by her PCP and orthopedic team.  She was given a short course of prednisone  which patient said significantly improved her symptoms but after she finished the course her symptoms returned.  She had recent lab workup which was largely unrevealing.  She says morning of 8/2 she had worsening symptoms with more severe neck pain and difficulty ambulating.  She went to the [facility] ED for evaluation.  Per documentation there was initially a concern for transverse myelitis and she was given 1 g of steroids with some initial improvement.  Imaging workup as documented below:  MRI head: IMPRESSION: Age-appropriate atrophy and small-vessel disease. Negative for acute intracranial pathology.   MRI cervical/thoracic/lumbar spine W/WO: IMPRESSION: C-spine: Degenerative changes in the mid to lower cervical spine. Negative for abnormality of the cervical spine. Heterogeneous bone marrow signal. Most likely metastatic disease or myeloproliferative disorder. Thoracic spine: Diffuse abnormal appearance to the thoracic vertebral bodies without fractures. Consider diffuse metastatic disease or myeloproliferative disorder most likely. No fractures. Lumbar spine: Heterogeneous bone marrow signal throughout but no focal  lesions. Differential considerations include myeloproliferative disorder and metastatic disease Overall disease is less prominent than in the thoracic spine.   CT chest/abdomen/pelvis w/contrast: IMPRESSION: 1. Right upper lobe nodule that is less than 3.0 mm in size. Stable left lower lobe nodule. According to the 2017 Fleischner criteria, if the patient is low risk, no routine follow-up is recommended. If the patient is high risk, an optional CT at 12 months is recommended. 2. No evidence of metastatic disease in the abdomen or pelvis. 3. Colonic diverticulosis without signs of diverticulitis. 4. Mild hepatosplenomegaly. 5. Extensive atherosclerosis including coronary artery disease.   Per documentation, the ED physician discussed the MRI spine findings with oncology, Dr. Cornelius, who recommended biopsy.  Biopsy unable to be performed at their facility therefore transfer was sought.  Foley catheter was also placed at [facility] ED for unclear reason.  Patient has felt generally weak but no focal deficits.  No bowel or bladder incontinence.  No chest pain, dyspnea.  Review of Systems: All systems reviewed and are negative except as documented in history of present illness above.   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/30/2021   right leg only    Past Surgical History:  Procedure Laterality Date   BREAST LUMPECTOMY W/ NEEDLE LOCALIZATION     axillary tail of the left  breast    COLON SURGERY  02/2019   for stage I colon cancer   colonscopy  2020  ROBOTIC ASSISTED BILATERAL SALPINGO OOPHERECTOMY N/A 07/14/2021   ROBOTIC ASSISTED TOTAL HYSTERECTOMY WITH BILATERAL SALPINGO  OOPHERECTOMY  07/14/2021   TOTAL HIP ARTHROPLASTY Right 08/30/2017   Procedure: RIGHT TOTAL HIP ARTHROPLASTY ANTERIOR APPROACH;  Surgeon: Melodi Lerner, MD;  Location: [facility] ORS;  Service: Orthopedics;  Laterality: Right;    Social History: Social History   Tobacco Use   Smoking status: Former    Types: Cigarettes   Smokeless tobacco: Never   Tobacco comments:    Quit 50 years ago   Vaping Use   Vaping status: Never Used  Substance Use Topics   Alcohol use: Yes    Comment: rare wine   Drug use: No    Allergies  Allergen Reactions   Sulfa Antibiotics Rash    Broke out in a rash 40 years ago  Broke out in a rash 40 years ago     Family History  Problem Relation Age of Onset   Cancer Mother    Cancer Father      Prior to Admission medications   Medication Sig Start Date End Date Taking? Authorizing Provider  Calcium Carbonate-Vitamin D  600-10 MG-MCG TABS Take 1 tablet by mouth daily. 600mg /40mcg    [provider]  denosumab  (PROLIA ) 60 MG/ML SOSY injection Inject 60 mg into the skin every 6 (six) months. 06/03/24   Sherre Clapper, MD  finasteride (PROSCAR) 5 MG tablet Take by mouth. 09/04/22   [provider]  lisinopril  (ZESTRIL ) 40 MG tablet TAKE 1 TABLET BY MOUTH TWICE A DAY 05/20/24   Cox, Kirsten, MD  Multiple Vitamin (MULTIVITAMIN ADULT PO) Take 1 tablet by mouth daily.    [provider]  Multiple Vitamins-Minerals (PRESERVISION AREDS 2) CAPS Take 1 capsule by mouth 2 (two) times daily.    [provider]  oxyCODONE  (ROXICODONE ) 5 MG immediate release tablet Take 1 tablet (5 mg total) by mouth every 6 (six) hours as needed for severe pain (pain score 7-10) (Sedation precautions and avoid alcohol). 07/09/24   Dottie Norleen [name], MD  pravastatin  (PRAVACHOL ) 40 MG tablet TAKE 1 TABLET BY MOUTH EVERYDAY AT BEDTIME 02/27/24   CoxClapper, MD  predniSONE  (DELTASONE ) 20 MG tablet Take 1 tablet (20 mg total) by mouth daily with breakfast. 07/10/24    Dottie Norleen [name], MD    Physical Exam: Vitals:   07/13/24 2230 07/13/24 2308  BP: (!) 162/59 (!) 162/59  Pulse:  65  Resp:  12  Temp: 98 F (36.7 C) 98 F (36.7 C)  TempSrc: Oral Oral  Weight:  49.5 kg  Height:  5' 1 (1.549 m)   Constitutional: Thin elderly woman resting in bed.  NAD, calm, comfortable Eyes: EOMI, lids and conjunctivae normal ENMT: Mucous membranes are moist. Posterior pharynx clear of any exudate or lesions.Normal dentition.  Somewhat hard of hearing. Neck: normal, supple, no masses. Respiratory: clear to auscultation bilaterally, no wheezing, no crackles. Normal respiratory effort. No accessory muscle use.  Cardiovascular: Regular rate and rhythm, no murmurs / rubs / gallops. No extremity edema. 2+ pedal pulses. Abdomen: no tenderness, no masses palpated. Musculoskeletal: no clubbing / cyanosis. No joint deformity upper and lower extremities. Good ROM, no contractures. Normal muscle tone.  Skin: no rashes, lesions, ulcers. No induration Neurologic: Sensation intact.  Strength 5/5 in all 4.  Psychiatric:  Alert and oriented x 3.  Anxious mood.   EKG: Not performed.  Assessment/Plan Principal Problem:   Abnormal MRI, spine Active Problems:   Primary hypertension  Mixed hyperlipidemia   Acute generalized body pain   GREENLEE ANCHETA is a 89 y.o. female with medical history significant for HTN, HLD, osteoporosis who is admitted from [facility] to pursue biopsy of heterogeneous bone marrow findings on MRI spine.  Assessment and Plan: Heterogeneous bone marrow throughout the spine: Seen on imaging performed at [facility].  This could be metastatic disease versus myeloproliferative disorder.  Follow-up CT chest/abdomen/pelvis showed RUL nodule <3.0 mm and stable 4.1 mm LLL nodule, no evidence of metastatic disease in the abdomen or pelvis. Per accepting provider, the case was discussed with oncology, Dr. Cornelius, who recommended biopsy.   Patient was transferred here due to unavailable services at [facility].  Patient's primary symptoms are generalized body pain/weakness from the neck down, mostly described as arthralgias and myalgias that has been steroid responsive.  No focal weakness on exam.  MRI brain was negative for acute changes.  Potentially an inflammatory process contributing to symptoms. - Discuss with IR in a.m. regarding biopsy - Check ESR, CRP, CK - Hold statin  Hypertension: Continue lisinopril .  Hyperlipidemia: Holding pravastatin .   DVT prophylaxis: SCDs Start: 07/14/24 0004 Code Status: Full code.  Patient wants to continue thinking about CODE STATUS and will update us  for any changes. Family Communication: Discussed with patient, she has discussed with family Disposition Plan: From home, dispo pending clinical progress Consults called: None Severity of Illness: The appropriate patient status for this patient is OBSERVATION. Observation status is judged to be reasonable and necessary in order to provide the required intensity of service to ensure the patient's safety. The patient's presenting symptoms, physical exam findings, and initial radiographic and laboratory data in the context of their medical condition is felt to place them at decreased risk for further clinical deterioration. Furthermore, it is anticipated that the patient will be medically stable for discharge from the hospital within 2 midnights of admission.   Jorie Blanch MD [facility]  If 7PM-7AM, please contact night-coverage [email or web address]  07/14/2024, 12:17 AM

## 2024-07-13 NOTE — Progress Notes (Signed)
 Plan of Care Note for accepted transfer   Patient: Pamela Price MRN: [id number]   DOA: (Not on file)  Facility requesting transfer: [facility] Requesting Provider: Dr. Cleotilde, EDP Reason for transfer: Services not available Facility course:   Patient presented with severe neck pain.  She had this previously saw orthopedic surgery and had a steroid taper with improvement but pain returned and was more severe.  Imaging there included MRI brain which showed no acute normality.  Followed by MR C spine/T-spine/L-spine showing heterogeneous bone marrow signal concerning for metastatic disease versus myeloproliferative disorder.  Subsequent CT chest abdomen pelvis showed right upper lobe lung nodule and stable left upper lobe nodule.  Also noted diverticulosis without diverticulitis and mild hepatosplenomegaly.  Initially there was concern for transverse myelitis and patient received 1 g of steroids with improvement in pain.  Case was discussed with oncology, Dr. Cornelius who is with the [facility] in [place]; who recommended biopsy.  Unable to get biopsy done at their facility.  Requesting transfer for further monitoring and IR evaluation for biopsy.  We also have neurosurgery available here if IR unable to perform biopsy.  Plan of care: The patient is accepted for admission to Telemetry unit, at [facility]..   Author: Marsa KATHEE Scurry, MD 07/13/2024  Check [email or web address] for on-call coverage.  Nursing staff, Please call [facility] Admits & Consults System-Wide number on Amion as soon as patient's arrival, so appropriate admitting provider can evaluate the pt.

## 2024-07-14 ENCOUNTER — Other Ambulatory Visit: Payer: Self-pay

## 2024-07-14 ENCOUNTER — Encounter (HOSPITAL_COMMUNITY): Payer: Self-pay | Admitting: Internal Medicine

## 2024-07-14 DIAGNOSIS — K5730 Diverticulosis of large intestine without perforation or abscess without bleeding: Secondary | ICD-10-CM | POA: Diagnosis not present

## 2024-07-14 DIAGNOSIS — R531 Weakness: Secondary | ICD-10-CM | POA: Diagnosis not present

## 2024-07-14 DIAGNOSIS — R911 Solitary pulmonary nodule: Secondary | ICD-10-CM | POA: Diagnosis not present

## 2024-07-14 DIAGNOSIS — R937 Abnormal findings on diagnostic imaging of other parts of musculoskeletal system: Secondary | ICD-10-CM | POA: Diagnosis not present

## 2024-07-14 DIAGNOSIS — I10 Essential (primary) hypertension: Secondary | ICD-10-CM

## 2024-07-14 DIAGNOSIS — R52 Pain, unspecified: Secondary | ICD-10-CM | POA: Diagnosis present

## 2024-07-14 DIAGNOSIS — E782 Mixed hyperlipidemia: Secondary | ICD-10-CM | POA: Diagnosis not present

## 2024-07-14 DIAGNOSIS — I2510 Atherosclerotic heart disease of native coronary artery without angina pectoris: Secondary | ICD-10-CM | POA: Diagnosis not present

## 2024-07-14 DIAGNOSIS — D649 Anemia, unspecified: Secondary | ICD-10-CM | POA: Diagnosis not present

## 2024-07-14 DIAGNOSIS — Z87891 Personal history of nicotine dependence: Secondary | ICD-10-CM | POA: Diagnosis not present

## 2024-07-14 DIAGNOSIS — M810 Age-related osteoporosis without current pathological fracture: Secondary | ICD-10-CM

## 2024-07-14 DIAGNOSIS — M25511 Pain in right shoulder: Secondary | ICD-10-CM | POA: Diagnosis not present

## 2024-07-14 DIAGNOSIS — D75839 Thrombocytosis, unspecified: Secondary | ICD-10-CM | POA: Diagnosis not present

## 2024-07-14 DIAGNOSIS — M25512 Pain in left shoulder: Secondary | ICD-10-CM | POA: Diagnosis not present

## 2024-07-14 DIAGNOSIS — M47812 Spondylosis without myelopathy or radiculopathy, cervical region: Secondary | ICD-10-CM | POA: Diagnosis not present

## 2024-07-14 DIAGNOSIS — M542 Cervicalgia: Secondary | ICD-10-CM | POA: Diagnosis not present

## 2024-07-14 DIAGNOSIS — R162 Hepatomegaly with splenomegaly, not elsewhere classified: Secondary | ICD-10-CM | POA: Diagnosis not present

## 2024-07-14 DIAGNOSIS — R9389 Abnormal findings on diagnostic imaging of other specified body structures: Secondary | ICD-10-CM | POA: Diagnosis not present

## 2024-07-14 DIAGNOSIS — M899 Disorder of bone, unspecified: Secondary | ICD-10-CM | POA: Diagnosis not present

## 2024-07-14 LAB — CBC
HCT: 33.6 % — ABNORMAL LOW (ref 36.0–46.0)
Hemoglobin: 11 g/dL — ABNORMAL LOW (ref 12.0–15.0)
MCH: 28.4 pg (ref 26.0–34.0)
MCHC: 32.7 g/dL (ref 30.0–36.0)
MCV: 86.8 fL (ref 80.0–100.0)
Platelets: 394 10*3/uL (ref 150–400)
RBC: 3.87 MIL/uL (ref 3.87–5.11)
RDW: 12.7 % (ref 11.5–15.5)
WBC: 11.1 10*3/uL — ABNORMAL HIGH (ref 4.0–10.5)
nRBC: 0 % (ref 0.0–0.2)

## 2024-07-14 LAB — RETICULOCYTES
Immature Retic Fract: 16.9 % — ABNORMAL HIGH (ref 2.3–15.9)
RBC.: 4.15 MIL/uL (ref 3.87–5.11)
Retic Count, Absolute: 63.5 10*3/uL (ref 19.0–186.0)
Retic Ct Pct: 1.5 % (ref 0.4–3.1)

## 2024-07-14 LAB — COMPREHENSIVE METABOLIC PANEL WITH GFR
ALT: 27 U/L (ref 0–44)
AST: 17 U/L (ref 15–41)
Albumin: 2.7 g/dL — ABNORMAL LOW (ref 3.5–5.0)
Alkaline Phosphatase: 61 U/L (ref 38–126)
Anion gap: 10 (ref 5–15)
BUN: 22 mg/dL (ref 8–23)
CO2: 23 mmol/L (ref 22–32)
Calcium: 8.4 mg/dL — ABNORMAL LOW (ref 8.9–10.3)
Chloride: 102 mmol/L (ref 98–111)
Creatinine, Ser: 0.56 mg/dL (ref 0.44–1.00)
GFR, Estimated: >60 mL/min
Glucose, Bld: 111 mg/dL — ABNORMAL HIGH (ref 70–99)
Potassium: 4.2 mmol/L (ref 3.5–5.1)
Sodium: 135 mmol/L (ref 135–145)
Total Bilirubin: 0.5 mg/dL (ref 0.0–1.2)
Total Protein: 6.1 g/dL — ABNORMAL LOW (ref 6.5–8.1)

## 2024-07-14 LAB — SEDIMENTATION RATE: Sed Rate: 40 mm/h — ABNORMAL HIGH (ref 0–22)

## 2024-07-14 LAB — TECHNOLOGIST SMEAR REVIEW: Plt Morphology: NORMAL

## 2024-07-14 LAB — PROCALCITONIN: Procalcitonin: <0.1 ng/mL

## 2024-07-14 LAB — C-REACTIVE PROTEIN
CRP: 4.3 mg/dL — ABNORMAL HIGH
CRP: 4.4 mg/dL — ABNORMAL HIGH

## 2024-07-14 LAB — CK: Total CK: 19 U/L — ABNORMAL LOW (ref 38–234)

## 2024-07-14 MED ORDER — ONDANSETRON HCL 4 MG PO TABS: 4.0000 mg | ORAL_TABLET | Freq: Four times a day (QID) | ORAL | Status: DC | PRN

## 2024-07-14 MED ORDER — METHYLPREDNISOLONE SODIUM SUCC 40 MG IJ SOLR
40.0000 mg | INTRAMUSCULAR | Status: DC
  Administered 2024-07-14 – 2024-07-15 (×2): 40 mg via INTRAVENOUS
  Filled 2024-07-14 (×2): qty 1

## 2024-07-14 MED ORDER — ACETAMINOPHEN 325 MG PO TABS: 650.0000 mg | ORAL_TABLET | Freq: Four times a day (QID) | ORAL | Status: DC | PRN

## 2024-07-14 MED ORDER — SENNOSIDES-DOCUSATE SODIUM 8.6-50 MG PO TABS: 1.0000 | ORAL_TABLET | Freq: Every evening | ORAL | Status: DC | PRN

## 2024-07-14 MED ORDER — METHYLPREDNISOLONE SODIUM SUCC 40 MG IJ SOLR
40.0000 mg | INTRAMUSCULAR | Status: DC
  Filled 2024-07-14: qty 1

## 2024-07-14 MED ORDER — SODIUM CHLORIDE 0.9% FLUSH
3.0000 mL | Freq: Two times a day (BID) | INTRAVENOUS | Status: DC
  Administered 2024-07-14 – 2024-07-16 (×4): 3 mL via INTRAVENOUS

## 2024-07-14 MED ORDER — CHLORHEXIDINE GLUCONATE CLOTH 2 % EX PADS
6.0000 | MEDICATED_PAD | Freq: Every day | CUTANEOUS | Status: DC
  Administered 2024-07-14 – 2024-07-15 (×2): 6 via TOPICAL

## 2024-07-14 MED ORDER — HYDROMORPHONE HCL 1 MG/ML IJ SOLN: 0.5000 mg | INTRAMUSCULAR | Status: DC | PRN

## 2024-07-14 MED ORDER — LACTATED RINGERS IV SOLN: INTRAVENOUS | Status: AC

## 2024-07-14 MED ORDER — ONDANSETRON HCL 4 MG/2ML IJ SOLN: 4.0000 mg | Freq: Four times a day (QID) | INTRAMUSCULAR | Status: DC | PRN

## 2024-07-14 MED ORDER — TRAMADOL HCL 50 MG PO TABS
50.0000 mg | ORAL_TABLET | Freq: Two times a day (BID) | ORAL | Status: DC | PRN
  Administered 2024-07-14 – 2024-07-15 (×2): 50 mg via ORAL
  Filled 2024-07-14 (×2): qty 1

## 2024-07-14 MED ORDER — ACETAMINOPHEN 650 MG RE SUPP: 650.0000 mg | Freq: Four times a day (QID) | RECTAL | Status: DC | PRN

## 2024-07-14 MED ORDER — HEPARIN SODIUM (PORCINE) 5000 UNIT/ML IJ SOLN
5000.0000 [IU] | Freq: Three times a day (TID) | INTRAMUSCULAR | Status: DC
  Administered 2024-07-14 – 2024-07-15 (×3): 5000 [IU] via SUBCUTANEOUS
  Filled 2024-07-14 (×4): qty 1

## 2024-07-14 MED ORDER — LISINOPRIL 20 MG PO TABS
40.0000 mg | ORAL_TABLET | Freq: Two times a day (BID) | ORAL | Status: DC
  Administered 2024-07-14 (×2): 40 mg via ORAL
  Filled 2024-07-14 (×2): qty 2

## 2024-07-14 NOTE — Consult Note (Signed)
 Chief Complaint: Patient was seen in consultation today for bone marrow biopsy   Referring Physician(s): Dr. Lavada Stank  Supervising Physician: Jenna Hacker  Patient Status: [facility] - In-pt  History of Present Illness: Pamela Price is a 89 y.o. female admitted with generalized pain and weakness. Her workup is concerning for myeloproliferative disorder. Oncology was consulted and recommends bone marrow biopsy.  IR consulted to perform BM bx  PMHx, meds, labs, imaging, allergies reviewed. Feels well, no recent fevers, chills, illness. Has been NPO today as directed. Family at bedside.   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/30/2021   right leg only    Past Surgical History:  Procedure Laterality Date   BREAST LUMPECTOMY W/ NEEDLE LOCALIZATION     axillary tail of the left  breast    COLON SURGERY  02/2019   for stage I colon cancer   colonscopy  2020   ROBOTIC ASSISTED BILATERAL SALPINGO OOPHERECTOMY N/A 07/14/2021   ROBOTIC ASSISTED TOTAL HYSTERECTOMY WITH BILATERAL SALPINGO OOPHERECTOMY  07/14/2021   TOTAL HIP ARTHROPLASTY Right 08/30/2017   Procedure: RIGHT TOTAL HIP ARTHROPLASTY ANTERIOR APPROACH;  Surgeon: Melodi Lerner, MD;  Location: [facility];  Service: Orthopedics;  Laterality: Right;    Allergies: Sulfa antibiotics  Medications:  Current Facility-Administered Medications:    acetaminophen  (TYLENOL ) tablet 650 mg, 650 mg, Oral, Q6H PRN **OR** acetaminophen  (TYLENOL ) suppository 650 mg, 650 mg, Rectal, Q6H PRN, Patel, Vishal R, MD   Chlorhexidine  Gluconate Cloth 2 % PADS 6 each, 6 each, Topical, Daily, Tobie Jorie SAUNDERS, MD, 6 each at  07/14/24 0855   heparin  injection 5,000 Units, 5,000 Units, Subcutaneous, Q8H, Singh, Prashant K, MD   HYDROmorphone  (DILAUDID ) injection 0.5 mg, 0.5 mg, Intravenous, Q4H PRN, Tobie Jorie SAUNDERS, MD   lactated ringers  infusion, , Intravenous, Continuous, Stank Lavada POUR, MD, Last Rate: 75 mL/hr at 07/14/24 0851, New Bag at 07/14/24 0851   lisinopril  (ZESTRIL ) tablet 40 mg, 40 mg, Oral, BID, Patel, Vishal R, MD, 40 mg at 07/14/24 9096   ondansetron  (ZOFRAN ) tablet 4 mg, 4 mg, Oral, Q6H PRN **OR** ondansetron  (ZOFRAN ) injection 4 mg, 4 mg, Intravenous, Q6H PRN, Patel, Vishal R, MD   senna-docusate (Senokot-S) tablet 1 tablet, 1 tablet, Oral, QHS PRN, Tobie, Vishal R, MD   sodium chloride  flush (NS) 0.9 % injection 3 mL, 3 mL, Intravenous, Q12H, Patel, Vishal R, MD   traMADol  (ULTRAM ) tablet 50 mg, 50 mg, Oral, Q12H PRN, Tobie Jorie SAUNDERS, MD, 50 mg at 07/14/24 9351    Family History  Problem Relation Age of Onset   Cancer Mother    Cancer Father     Social History   Socioeconomic History   Marital status: Widowed    Spouse name: Not on file   Number of children: 2   Years of education: Not on file   Highest education level: Some college, no degree  Occupational History   Not on file  Tobacco Use   Smoking status: Former    Types: Cigarettes   Smokeless tobacco: Never   Tobacco comments:    Quit 50 years ago   Vaping Use   Vaping status: Never Used  Substance and  Sexual Activity   Alcohol use: Yes    Comment: rare wine   Drug use: No   Sexual activity: Not on file  Other Topics Concern   Not on file  Social History Narrative   Not on file   Social Drivers of Health   Financial Resource Strain: Low Risk  (07/09/2024)   Overall Financial Resource Strain (CARDIA)    Difficulty of Paying Living Expenses: Not hard at all  Food Insecurity: No Food Insecurity (07/14/2024)   Hunger Vital Sign    Worried About Running Out of Food in the Last Year: Never true    Ran Out of Food in  the Last Year: Never true  Transportation Needs: No Transportation Needs (07/14/2024)   PRAPARE - Administrator, Civil Service (Medical): No    Lack of Transportation (Non-Medical): No  Physical Activity: Insufficiently Active (07/09/2024)   Exercise Vital Sign    Days of Exercise per Week: 5 days    Minutes of Exercise per Session: 20 min  Stress: Stress Concern Present (07/09/2024)   [employer] of Occupational Health - Occupational Stress Questionnaire    Feeling of Stress: Very much  Social Connections: Socially Isolated (07/14/2024)   Social Connection and Isolation Panel    Frequency of Communication with Friends and Family: More than three times a week    Frequency of Social Gatherings with Friends and Family: Twice a week    Attends Religious Services: Never    Database administrator or Organizations: No    Attends Banker Meetings: Never    Marital Status: Widowed    Review of Systems: A 12 point ROS discussed and pertinent positives are indicated in the HPI above.  All other systems are negative.  Review of Systems  Vital Signs: BP (!) 155/60 (BP Location: Right Arm)   Pulse 74   Temp 98.4 F (36.9 C) (Oral)   Resp 16   Ht 5' 1 (1.549 m)   Wt 109 lb 2 oz (49.5 kg)   SpO2 98%   BMI 20.62 kg/m   Physical Exam Constitutional:      Appearance: She is not ill-appearing.  HENT:     Mouth/Throat:     Mouth: Mucous membranes are moist.     Pharynx: Oropharynx is clear.  Cardiovascular:     Rate and Rhythm: Normal rate and regular rhythm.     Heart sounds: Normal heart sounds.  Pulmonary:     Effort: Pulmonary effort is normal. No respiratory distress.     Breath sounds: Normal breath sounds.  Skin:    General: Skin is warm and dry.  Neurological:     General: No focal deficit present.     Mental Status: She is alert and oriented to person, place, and time.  Psychiatric:        Mood and Affect: Mood normal.        Thought Content:  Thought content normal.     Imaging: No results found.  Labs:  CBC: Recent Labs    09/12/23 1104 05/24/24 1151 07/07/24 1254 07/14/24 0535  WBC 7.0 8.2 7.0 11.1*  HGB 13.1 13.1 10.6* 11.0*  HCT 41.3 40.7 33.4* 33.6*  PLT 241 240 291 394    COAGS: No results for input(s): INR, APTT in the last 8760 hours.  BMP: Recent Labs    05/24/24 1151 06/12/24 1120 07/07/24 1254 07/14/24 0535  NA 143 140 138 135  K 5.6* 5.1 4.3 4.2  CL 105 103 103 102  CO2 22 23 22 23   GLUCOSE 101* 98 103* 111*  BUN 18 22 31* 22  CALCIUM 9.9 9.8 9.2 8.4*  CREATININE 0.78 0.74 0.58 0.56  GFRNONAA  --   --   --  >60    LIVER FUNCTION TESTS: Recent Labs    05/24/24 1151 06/12/24 1120 07/07/24 1254 07/14/24 0535  BILITOT 0.3 0.3 0.2 0.5  AST 25 17 15 17   ALT 23 14 16 27   ALKPHOS 71 74 72 61  PROT 6.6 6.6 6.6 6.1*  ALBUMIN 4.2 4.3 4.0 2.7*     Assessment and Plan: Generalized pain and weakness Anemia Concern for myeloproliferative disorder. Plan for image guided bone marrow biopsy tomorrow. Labs reviewed. Risks and benefits of bone marrow biopsy was discussed with the patient and/or patient's family including, but not limited to bleeding, infection, damage to adjacent structures or low yield requiring additional tests.  All of the questions were answered and there is agreement to proceed.  Consent signed and in chart.    Electronically Signed: Franky Rusk, PA-C 07/14/2024, 9:56 AM   I spent a total of 20 minutes in face to face in clinical consultation, greater than 50% of which was counseling/coordinating care for bone marrow biopsy

## 2024-07-14 NOTE — Evaluation (Signed)
 Physical Therapy Evaluation Patient Details Name: Pamela Price MRN: [id number] DOB: [birth year] Today's Date: 07/14/2024  History of Present Illness  89 y.o. female presenting 07/13/24 from [facility] with generalized body pain and weakness from neck down. MRI C-spine/T-spine/L spine showing heterogeneous bone marrow signal concerning for metastatic disease versus myeloproliferative disorder. CT shows RUL nodule < 3.0 mm in size. Transfer to [facility] 07/13/24 for bone marrow biopsy. PMH includes HTN, HLD, osteoporosis.   Clinical Impression  Pt presents with an overall decrease in functional mobility secondary to above. PTA, pt independent, lives alone, drives, mows lawn. Today, pt moving well without DME, demonstrates good BLE strength and fair balance strategies; pt's primary complaint is R shoulder/upper arm pain though it and associated weakness improved since yesterday (son attributes to prednisone ). Educ re: activity recommendations, importance of mobility, precautions for comfort. Pt would benefit from continued acute PT services to maximize functional mobility and independence prior to d/c home.       If plan is discharge home, recommend the following: Assistance with cooking/housework;Assist for transportation   Can travel by private vehicle    Yes    Equipment Recommendations None recommended by PT  Recommendations for Other Services       Functional Status Assessment Patient has had a recent decline in their functional status and demonstrates the ability to make significant improvements in function in a reasonable and predictable amount of time.     Precautions / Restrictions Precautions Precautions: Fall;Other (comment) Recall of Precautions/Restrictions: Intact Precaution/Restrictions Comments: foley, back precautions for comfort Restrictions Weight Bearing Restrictions Per Provider Order: No      Mobility  Bed Mobility Overal bed mobility: Needs Assistance Bed  Mobility: Rolling, Sidelying to Sit Rolling: Supervision, Used rails Sidelying to sit: Supervision       General bed mobility comments: educ on log roll for comfort onto L side, use of bed rail, increased time, supervision for safety    Transfers Overall transfer level: Needs assistance Equipment used: None Transfers: Sit to/from Stand Sit to Stand: Contact guard assist           General transfer comment: multiple sit<>stands from EOB and recliner without DME, CGA for safety/lines    Ambulation/Gait Ambulation/Gait assistance: Contact guard assist, Supervision Gait Distance (Feet): 500 Feet Assistive device: IV Pole, None Gait Pattern/deviations: Step-through pattern, Decreased stride length, Trunk flexed       General Gait Details: fast, steady gait initially pushing IV pole with CGA progressing to no UE support and supervision for safety/line; slight forward lean and drifting left, pt attributes to having to move around so many objects in hallway  Stairs            Wheelchair Mobility     Tilt Bed    Modified Rankin (Stroke Patients Only)       Balance Overall balance assessment: Needs assistance Sitting-balance support: No upper extremity supported, Feet supported Sitting balance-Leahy Scale: Good Sitting balance - Comments: able to bend down to feet and cross bilateral legs but unable to don socks requiring assist   Standing balance support: No upper extremity supported, During functional activity Standing balance-Leahy Scale: Good Standing balance comment: brushing teeth at sink without UE support                             Pertinent Vitals/Pain Pain Assessment Pain Assessment: Faces Faces Pain Scale: Hurts little more Pain Location: R anterior shoulder and upper arm  to elbow, sharp pain in forearm with shoulder flex; sometimes by buttocks pinches Pain Descriptors / Indicators: Sore, Sharp Pain Intervention(s): Monitored during  session, Limited activity within patient's tolerance    Home Living Family/patient expects to be discharged to:: Private residence Living Arrangements: Alone Available Help at Discharge: Family;Available PRN/intermittently Type of Home: House Home Access: Stairs to enter Entrance Stairs-Rails: None Entrance Stairs-Number of Steps: 2 Alternate Level Stairs-Number of Steps: flight Home Layout: Two level Home Equipment: Shower seat;Grab bars - tub/shower Additional Comments: sons plan to provide initial 24/7 assist as needed    Prior Function Prior Level of Function : Independent/Modified Independent;Driving             Mobility Comments: indep without DME, drives, mows lawn (push mower, garden) otherwise primarily sedentary ADLs Comments: indep with ADLs     Extremity/Trunk Assessment   Upper Extremity Assessment Upper Extremity Assessment: Right hand dominant;RUE deficits/detail RUE Deficits / Details: limited shoulder AROM flex/abd to ~90' secondary to R shoulder/upper arm pain, decreased grip strength; notes she was not able to move R fingers at all yesterday but now able to (son attributes to prednisone )    Lower Extremity Assessment Lower Extremity Assessment: Generalized weakness (gross strength >/ 4/5 throughout, denies numbness/tingling)       Communication   Communication Communication: No apparent difficulties    Cognition Arousal: Alert Behavior During Therapy: WFL for tasks assessed/performed   PT - Cognitive impairments: Attention, Memory, Problem solving                       PT - Cognition Comments: tangential with poor attention in conversation requiring frequent redirection to current topic; seems near baseline based on son's comments Following commands: Intact       Cueing Cueing Techniques: Verbal cues     General Comments General comments (skin integrity, edema, etc.): son present and supportive. SpO2 96% on RA with activity. educ pt  and son re: role of acute PT, activity recommendations, importance of mobility, spinal precautions for comfort, options for outpatient PT etc if needed in future for potentially progressive pain/weakness    Exercises     Assessment/Plan    PT Assessment Patient needs continued PT services  PT Problem List Decreased mobility;Decreased strength       PT Treatment Interventions DME instruction;Gait training;Stair training;Functional mobility training;Therapeutic activities;Therapeutic exercise;Balance training;Patient/family education    PT Goals (Current goals can be found in the Care Plan section)  Acute Rehab PT Goals Patient Stated Goal: decreased pain, return home, figure out what is going on PT Goal Formulation: With patient/family Time For Goal Achievement: 07/28/24 Potential to Achieve Goals: Good    Frequency Min 1X/week     Co-evaluation               AM-PAC PT 6 Clicks Mobility  Outcome Measure Help needed turning from your back to your side while in a flat bed without using bedrails?: None Help needed moving from lying on your back to sitting on the side of a flat bed without using bedrails?: None Help needed moving to and from a bed to a chair (including a wheelchair)?: A Little Help needed standing up from a chair using your arms (e.g., wheelchair or bedside chair)?: A Little Help needed to walk in hospital room?: A Little Help needed climbing 3-5 steps with a railing? : A Little 6 Click Score: 20    End of Session Equipment Utilized During Treatment: Gait belt Activity  Tolerance: Patient tolerated treatment well Patient left: in chair;with call bell/phone within reach;with chair alarm set;with family/visitor present Nurse Communication: Mobility status PT Visit Diagnosis: Other abnormalities of gait and mobility (R26.89)    Time: 0920-1006 PT Time Calculation (min) (ACUTE ONLY): 46 min   Charges:   PT Evaluation $PT Eval Low Complexity: 1 Low PT  Treatments $Therapeutic Activity: 8-22 mins $Self Care/Home Management: 8-22 PT General Charges $$ ACUTE PT VISIT: 1 Visit    Darice Almas, PT, DPT Acute Rehabilitation Services  Personal: Secure Chat Rehab Office: [phone]  Darice LITTIE Almas 07/14/2024, 12:58 PM

## 2024-07-14 NOTE — Evaluation (Signed)
 Occupational Therapy Evaluation Patient Details Name: Pamela Price MRN: [id number] DOB: 03/06/1933 Today's Date: 07/14/2024   History of Present Illness   89 y.o. female presenting 07/13/24 from [facility] with generalized body pain and weakness from neck down. MRI C-spine/T-spine/L spine showing heterogeneous bone marrow signal concerning for metastatic disease versus myeloproliferative disorder. CT shows RUL nodule < 3.0 mm in size. Transfer to [facility] 07/13/24 for bone marrow biopsy. PMH includes HTN, HLD, osteoporosis.     Clinical Impressions PTA, pt lived alone and was independent; sons live nearby and can assist if needed. Upon eval, pt presents with R shoulder pain, generalized weakness, and decreased memory. Pt needing up to min A for LB ADL this session and demonstrating fair activity tolerance. Pt and son disputing medications on arrival. Reached out to RN to request going over hospital medications with pt and to ask for pt's my chart to be updated as discrepancy between verbalized instruction and my chart list seems to be cause for confusion.      If plan is discharge home, recommend the following:   A little help with walking and/or transfers;A little help with bathing/dressing/bathroom;Assistance with cooking/housework;Assist for transportation;Help with stairs or ramp for entrance     Functional Status Assessment   Patient has had a recent decline in their functional status and demonstrates the ability to make significant improvements in function in a reasonable and predictable amount of time.     Equipment Recommendations   None recommended by OT     Recommendations for Other Services         Precautions/Restrictions   Precautions Precautions: Fall;Other (comment) Recall of Precautions/Restrictions: Intact Precaution/Restrictions Comments: foley, back precautions for comfort Restrictions Weight Bearing Restrictions Per Provider Order: No      Mobility Bed Mobility               General bed mobility comments: OOB in chair    Transfers Overall transfer level: Needs assistance Equipment used: None Transfers: Sit to/from Stand Sit to Stand: Contact guard assist                  Balance Overall balance assessment: Needs assistance Sitting-balance support: No upper extremity supported, Feet supported Sitting balance-Leahy Scale: Good     Standing balance support: No upper extremity supported, During functional activity Standing balance-Leahy Scale: Good Standing balance comment: brushing teeth at sink without UE support                           ADL either performed or assessed with clinical judgement   ADL Overall ADL's : Needs assistance/impaired Eating/Feeding: Independent;Sitting   Grooming: Supervision/safety;Standing   Upper Body Bathing: Modified independent   Lower Body Bathing: Supervison/ safety;Sit to/from stand   Upper Body Dressing : Modified independent   Lower Body Dressing: Supervision/safety;Sit to/from stand   Toilet Transfer: Minimal assistance           Functional mobility during ADLs: Contact guard assist       Vision Baseline Vision/History: 1 Wears glasses Ability to See in Adequate Light: 0 Adequate Patient Visual Report: No change from baseline Vision Assessment?: No apparent visual deficits     Perception Perception: Not tested       Praxis Praxis: Not tested       Pertinent Vitals/Pain Pain Assessment Pain Assessment: Faces Faces Pain Scale: Hurts little more Pain Location: R anterior shoulder and upper arm to elbow, sharp pain in forearm  with shoulder flex; sometimes by buttocks pinches Pain Descriptors / Indicators: Sore, Sharp Pain Intervention(s): Limited activity within patient's tolerance, Monitored during session     Extremity/Trunk Assessment Upper Extremity Assessment Upper Extremity Assessment: Right hand dominant;RUE  deficits/detail RUE Deficits / Details: limited shoulder AROM flex/abd to ~130' secondary to R shoulder/upper arm pain, decreased grip strength; notes she was not able to move R fingers at all yesterday but now able to (son attributes to prednisone  or similar medication)   Lower Extremity Assessment Lower Extremity Assessment: Defer to PT evaluation       Communication Communication Communication: No apparent difficulties   Cognition Arousal: Alert Behavior During Therapy: WFL for tasks assessed/performed Cognition: Cognition impaired       Memory impairment (select all impairments): Short-term memory     OT - Cognition Comments: pt scoring a 4 on the short blessed test indicating no impairment with errors with delayed memory recall and one error with stating months of year in reverse order. Aware to some extent that she made errors. Pt perseverative on medications on arrival and stating same statements to son repetitively; seems to be some confusions due to a discrepancy in her medications on her my-chart vs Dr. email response.                 Following commands: Intact       Cueing  General Comments   Cueing Techniques: Verbal cues  son present and supportive   Exercises     Shoulder Instructions      Home Living Family/patient expects to be discharged to:: Private residence Living Arrangements: Alone Available Help at Discharge: Family;Available PRN/intermittently Type of Home: House Home Access: Stairs to enter [employer] of Steps: 2 Entrance Stairs-Rails: None Home Layout: Two level Alternate Level Stairs-Number of Steps: flight Alternate Level Stairs-Rails: Right;Left Bathroom Shower/Tub: Producer, television/film/video: Handicapped height Bathroom Accessibility: Yes   Home Equipment: Shower seat;Grab bars - tub/shower   Additional Comments: sons plan to provide initial 24/7 assist as needed      Prior Functioning/Environment Prior  Level of Function : Independent/Modified Independent;Driving             Mobility Comments: indep without DME, drives, mows lawn (push mower, garden) otherwise primarily sedentary ADLs Comments: indep with ADLs    OT Problem List: Decreased strength;Decreased activity tolerance;Impaired balance (sitting and/or standing);Decreased range of motion;Pain   OT Treatment/Interventions: Therapeutic exercise;Self-care/ADL training;DME and/or AE instruction;Therapeutic activities;Patient/family education;Balance training      OT Goals(Current goals can be found in the care plan section)   Acute Rehab OT Goals Patient Stated Goal: get better OT Goal Formulation: With patient Time For Goal Achievement: 07/28/24 Potential to Achieve Goals: Good   OT Frequency:  Min 1X/week    Co-evaluation              AM-PAC OT 6 Clicks Daily Activity     Outcome Measure Help from another person eating meals?: None Help from another person taking care of personal grooming?: A Little Help from another person toileting, which includes using toliet, bedpan, or urinal?: A Little Help from another person bathing (including washing, rinsing, drying)?: A Little Help from another person to put on and taking off regular upper body clothing?: None Help from another person to put on and taking off regular lower body clothing?: A Little 6 Click Score: 20   End of Session Nurse Communication: Mobility status;Other (comment) (medication questions son would like answered)  Activity  Tolerance: Patient tolerated treatment well Patient left: in chair;with call bell/phone within reach;with chair alarm set  OT Visit Diagnosis: Unsteadiness on feet (R26.81);Muscle weakness (generalized) (M62.81)                Time: 8459-8374 OT Time Calculation (min): 45 min Charges:  OT General Charges $OT Visit: 1 Visit OT Evaluation $OT Eval Low Complexity: 1 Low OT Treatments $Self Care/Home Management : 23-37  mins  Elma JONETTA Lebron FREDERICK, OTR/L [facility] Acute Rehabilitation Office: [phone]   Elma JONETTA Lebron 07/14/2024, 4:37 PM

## 2024-07-14 NOTE — Plan of Care (Signed)

## 2024-07-14 NOTE — Consult Note (Signed)
 Pamela Price  [phone]   HEMATOLOGY ONCOLOGY INPATIENT CONSULTATION   Pamela Price  DOB: December 25, 1932  MR#: [id number]  CSN#: [id number]    Requesting Physician: [facility]  Patient Care Team: Dottie Norleen [name], MD as PCP - General (Family Medicine) Nyle Rankin POUR, [facility] (Inactive) as Pharmacist (Pharmacist)  Reason for consult: abnormal bone marrow signal on scan   History of present illness:   This is a 89 year old female with past medical history of hypertension, osteoporosis, who has had intermittent neck and bilateral shoulder pain for the past 3 to 4 weeks.  She was initially treated with steroid and responded very well.  She presented to the [facility] emergency room yesterday for worsening pain, and MRI was obtained, which showed degenerative changes, heterogeneous bone marrow signal, most likely metastatic disease or marrow proliferative disorder.  Patient was transferred to [facility] for further workup and management.  Hematologist Dr. Cornelius at the [facility] was called and she recommended a bone marrow biopsy.  Patient received a high dose of steroids yesterday in the ED, and her pain has much improved.  She denies any fever, night sweats, or recent weight loss.  She has no history of malignancy.   MEDICAL HISTORY:  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/30/2021   right leg only    SURGICAL HISTORY: Past Surgical History:  Procedure Laterality Date   BREAST LUMPECTOMY W/ NEEDLE LOCALIZATION     axillary tail of the left  breast    COLON SURGERY  02/2019   for stage I colon cancer   colonscopy  2020   ROBOTIC ASSISTED  BILATERAL SALPINGO OOPHERECTOMY N/A 07/14/2021   ROBOTIC ASSISTED TOTAL HYSTERECTOMY WITH BILATERAL SALPINGO OOPHERECTOMY  07/14/2021   TOTAL HIP ARTHROPLASTY Right 08/30/2017   Procedure: RIGHT TOTAL HIP ARTHROPLASTY ANTERIOR APPROACH;  Surgeon: Melodi Lerner, MD;  Location: [facility];  Service: Orthopedics;  Laterality: Right;    SOCIAL HISTORY: Social History   Socioeconomic History   Marital status: Widowed    Spouse name: Not on file   Number of children: 2   Years of education: Not on file   Highest education level: Some college, no degree  Occupational History   Not on file  Tobacco Use   Smoking status: Former    Types: Cigarettes   Smokeless tobacco: Never   Tobacco comments:    Quit 50 years ago   Vaping Use   Vaping status: Never Used  Substance and Sexual Activity   Alcohol use: Yes    Comment: rare wine   Drug use: No   Sexual activity: Not on file  Other Topics Concern   Not on file  Social History Narrative   Not on file   Social Drivers of Health   Financial Resource Strain: Low Risk  (07/09/2024)   Overall Financial Resource Strain (CARDIA)    Difficulty of Paying Living Expenses: Not hard at all  Food Insecurity: No Food Insecurity (07/14/2024)   Hunger Vital Sign    Worried About Running Out of Food in the Last Year: Never true    Ran Out of Food in the Last Year: Never true  Transportation Needs: No Transportation Needs (07/14/2024)   PRAPARE - Administrator, Civil Service (Medical): No    Lack of Transportation (Non-Medical): No  Physical Activity: Insufficiently Active (07/09/2024)   Exercise Vital Sign    Days of Exercise per Week: 5 days    Minutes of Exercise per Session: 20 min  Stress: Stress Concern Present (07/09/2024)   [employer] of Occupational Health - Occupational Stress Questionnaire    Feeling of Stress: Very much  Social Connections: Socially Isolated (07/14/2024)   Social Connection and Isolation Panel    Frequency  of Communication with Friends and Family: More than three times a week    Frequency of Social Gatherings with Friends and Family: Twice a week    Attends Religious Services: Never    Database administrator or Organizations: No    Attends Banker Meetings: Never    Marital Status: Widowed  Intimate Partner Violence: Not At Risk (07/14/2024)   Humiliation, Afraid, Rape, and Kick questionnaire    Fear of Current or Ex-Partner: No    Emotionally Abused: No    Physically Abused: No    Sexually Abused: No    FAMILY HISTORY: Family History  Problem Relation Age of Onset   Cancer Mother    Cancer Father     ALLERGIES:  is allergic to sulfa antibiotics.  MEDICATIONS:  Current Facility-Administered Medications  Medication Dose Route Frequency Provider Last Rate Last Admin   acetaminophen  (TYLENOL ) tablet 650 mg  650 mg Oral Q6H PRN Patel, Vishal R, MD       Or   acetaminophen  (TYLENOL ) suppository 650 mg  650 mg Rectal Q6H PRN Tobie Jorie SAUNDERS, MD       Chlorhexidine  Gluconate Cloth 2 % PADS 6 each  6 each Topical Daily Tobie Jorie SAUNDERS, MD   6 each at 07/14/24 0855   heparin  injection 5,000 Units  5,000 Units Subcutaneous Q8H Singh, Prashant K, MD       HYDROmorphone  (DILAUDID ) injection 0.5 mg  0.5 mg Intravenous Q4H PRN Tobie Jorie SAUNDERS, MD       lactated ringers  infusion   Intravenous Continuous Singh, Prashant K, MD 75 mL/hr at 07/14/24 0851 New Bag at 07/14/24 0851   lisinopril  (ZESTRIL ) tablet 40 mg  40 mg Oral BID Patel, Vishal R, MD   40 mg at 07/14/24 9096   methylPREDNISolone  sodium succinate (SOLU-MEDROL ) 40 mg/mL injection 40 mg  40 mg Intravenous Q24H Singh, Prashant K, MD       ondansetron  (ZOFRAN ) tablet 4 mg  4 mg Oral Q6H PRN Patel, Vishal R, MD       Or   ondansetron  (ZOFRAN ) injection 4 mg  4 mg Intravenous Q6H PRN Patel, Vishal R, MD       senna-docusate (Senokot-S) tablet 1 tablet  1 tablet Oral QHS PRN Tobie Jorie SAUNDERS, MD       sodium chloride  flush (NS)  0.9 % injection 3 mL  3 mL Intravenous Q12H Patel, Vishal R, MD       traMADol  (ULTRAM ) tablet 50 mg  50 mg Oral Q12H PRN Patel, Vishal R, MD   50 mg at 07/14/24 9351    REVIEW OF SYSTEMS:   Constitutional: Denies fevers, chills or abnormal night sweats Eyes: Denies blurriness of vision, double vision or watery eyes Ears, nose, mouth, throat, and face: Denies mucositis or sore throat Respiratory: Denies cough, dyspnea or wheezes Cardiovascular: Denies palpitation, chest discomfort or lower extremity swelling Gastrointestinal:  Denies nausea, heartburn or change in bowel habits Skin: Denies abnormal skin rashes Lymphatics: Denies new lymphadenopathy or easy bruising Behavioral/Psych: Mood is stable, no new changes  All other systems were reviewed with the patient and are negative.  PHYSICAL EXAMINATION: ECOG PERFORMANCE STATUS: 1 - Symptomatic but completely ambulatory  Vitals:   07/14/24 0800 07/14/24 0903  BP: (!) 150/57 (!) 155/60  Pulse: 63 74  Resp: 18 16  Temp:  98.4 F (36.9 C)  SpO2: 98% 98%   Filed Weights   07/13/24 2308  Weight: 109 lb 2 oz (49.5 kg)    GENERAL:alert, no distress and comfortable SKIN: skin color, texture, turgor are normal, no rashes or significant lesions EYES: normal, conjunctiva are pink and non-injected, sclera clear OROPHARYNX:no exudate, no erythema and lips, buccal mucosa, and tongue normal  NECK: supple, thyroid  normal size, non-tender, without nodularity LYMPH:  no palpable lymphadenopathy in the cervical, axillary or inguinal LUNGS: clear to auscultation and percussion with normal breathing effort HEART: regular rate & rhythm and no murmurs and no lower extremity edema ABDOMEN:abdomen soft, non-tender and normal bowel sounds Musculoskeletal:no cyanosis of digits and no clubbing  PSYCH: alert & oriented x 3 with fluent speech NEURO: no focal motor/sensory deficits  LABORATORY DATA:  I have reviewed the data as listed Lab Results   Component Value Date   WBC 11.1 (H) 07/14/2024   HGB 11.0 (L) 07/14/2024   HCT 33.6 (L) 07/14/2024   MCV 86.8 07/14/2024   PLT 394 07/14/2024   Recent Labs    06/12/24 1120 07/07/24 1254 07/14/24 0535  NA 140 138 135  K 5.1 4.3 4.2  CL 103 103 102  CO2 23 22 23   GLUCOSE 98 103* 111*  BUN 22 31* 22  CREATININE 0.74 0.58 0.56  CALCIUM 9.8 9.2 8.4*  GFRNONAA  --   --  >60  PROT 6.6 6.6 6.1*  ALBUMIN 4.3 4.0 2.7*  AST 17 15 17   ALT 14 16 27   ALKPHOS 74 72 61  BILITOT 0.3 0.2 0.5    RADIOGRAPHIC STUDIES: I have personally reviewed the radiological images as listed and agreed with the findings in the report. No results found.  ASSESSMENT & PLAN:  89 year old female with past medical history of hypertension, osteoporosis, who presents with recurrent neck and bilateral shoulder pain.  MRI showed diffuse abnormal bone marrow signal, concerning for malignancy.  Neck and bilateral shoulder pain Abnormal bone marrow signal HTN Osteoporosis  Small lump nodules Mild normocytic anemia  Recommendations: - I reviewed her outside MRI and CT scan reports, but unfortunately are not able to see the images since they were done at the [facility]. - Her CBC only showed mild anemia, previous CBC was normal in June 2025.  CBC is not concerning for MPN - CT is negative for primary malignancy, her breast exam today is also unremarkable. - I reached out to interventional radiology, and request her outside CT and MRI scan to be reviewed, if there is suspicious bone lesions, we will request a bone biopsy.  I do not think a random bone marrow biopsy will have high yield. - I will order additional anemia workup and tumor markers.  All questions were answered. The patient knows to call the clinic with any problems, questions or concerns.      Onita Mattock, MD 07/14/2024 12:50 PM

## 2024-07-14 NOTE — TOC Initial Note (Signed)
 Transition of Care [facility]) - Initial/Assessment Note    Patient Details  Name: Pamela [facility] MRN: [id number] Date of Birth: Jun 22, 1933  Transition of [facility]) CM/SW Contact:    Marval Gell, RN Phone Number: 07/14/2024, 2:47 PM  Clinical Narrative:                  Per chart review Patient admitted from home for generalized weakness, workup ongoing. Therapies not recommending follow up or DME at this time.  ICM available for DC planning if needed, please consult. Expected Discharge Plan: Home/Self Care Barriers to Discharge: Continued Medical Work up   Patient Goals and CMS Choice            Expected Discharge Plan and Services   Discharge Planning Services: CM Consult   Living arrangements for the past 2 months: Single Family Home                                      Prior Living Arrangements/Services Living arrangements for the past 2 months: Single Family Home Lives with:: Self                   Activities of Daily Living   ADL Screening (condition at time of admission) Independently performs ADLs?: No Does the patient have a NEW difficulty with bathing/dressing/toileting/self-feeding that is expected to last >3 days?: Yes (Initiates electronic notice to provider for possible OT consult) Does the patient have a NEW difficulty with getting in/out of bed, walking, or climbing stairs that is expected to last >3 days?: Yes (Initiates electronic notice to provider for possible PT consult) Does the patient have a NEW difficulty with communication that is expected to last >3 days?: No Is the patient deaf or have difficulty hearing?: Yes (Bilateral Hearing Aids) Does the patient have difficulty seeing, even when wearing glasses/contacts?: Yes (Glasses) Does the patient have difficulty concentrating, remembering, or making decisions?: No  Permission Sought/Granted                  Emotional Assessment              Admission diagnosis:   Acute generalized body pain [R52] Patient Active Problem List   Diagnosis Date Noted   Acute generalized body pain 07/14/2024   Abnormal MRI, spine 07/13/2024   Cervical nerve root impingement 07/10/2024   Encounter for Medicare annual wellness exam 09/16/2023   Hearing loss 09/09/2022   Macular degeneration of both eyes 09/28/2021   Age-related osteoporosis without current pathological fracture 09/28/2021   Primary hypertension 09/03/2021   Mixed hyperlipidemia 09/03/2021   Osteoarthritis 08/30/2017   PCP:  Dottie Norleen [name], MD Pharmacy:   [phone] [place], [place] - [address] [place] [address] Phone: [phone] Fax: [phone]  [facility] [place], [place] - [address] [facility] [place] AT Portal to Registered [facility] Sites [address] [place] [address] Phone: [phone] Fax: [phone]  [id number] [place], [place] - [address] AT [facility] [address] [place] [address] Phone: [phone] Fax: [phone]  [facility] [place], [place] - [address] [place] [address] Phone: [phone] Fax: [phone]     Social Drivers of Health (SDOH) Social History: SDOH Screenings   Food Insecurity: No Food  Insecurity (07/14/2024)  Housing: Low Risk  (07/14/2024)  Transportation Needs: No Transportation Needs (07/14/2024)  Utilities: Not At Risk (07/14/2024)  Alcohol Screen: Low Risk  (09/12/2023)  Depression (PHQ2-9): Low Risk  (07/04/2024)  Financial Resource Strain: Low Risk  (07/09/2024)  Physical Activity: Insufficiently Active (07/09/2024)  Social Connections: Socially Isolated (07/14/2024)  Stress: Stress Concern Present (07/09/2024)  Tobacco Use: Medium Risk (07/10/2024)  Health Literacy: Adequate Health Literacy (09/12/2023)   SDOH Interventions:     Readmission Risk  Interventions     No data to display

## 2024-07-14 NOTE — Progress Notes (Signed)
 PROGRESS NOTE                                                                                                                                                                                                             Patient Demographics:    Pamela Price, is a 89 y.o. female, DOB - May 05, 1933, FMW:[id number]  Outpatient Primary MD for the patient is Dottie Norleen [name], MD    LOS - 1  Admit date - 07/13/2024    No chief complaint on file.      Brief Narrative (HPI from H&P)   89 y.o. female with medical history significant for HTN, HLD, osteoporosis who presented to the [facility] ED for evaluation of generalized pain and weakness from neck down.   Patient states that for the last 3 weeks she has had generalized body pain and weakness from her neck down, primarily arthralgias and myalgias.  She has been evaluated by her PCP and orthopedic team.  She was given a short course of prednisone  which patient said significantly improved her symptoms but after she finished the course her symptoms returned.  She had recent lab workup which was largely unrevealing.    She was subsequently seen in the ER of [facility] where she had MRI CT and L-spine along with CT chest abdomen pelvis, on the CT there was a 3 mm nodule on the left lower lobe noted which was thought to be low risk however on the MRI of her spine there were changes suspicious for myeloproliferative disorder.  She was transferred to [facility] for further workup.   Subjective:    Meade Broaden today has, No headache, No chest pain, No abdominal pain - No Nausea, No new weakness tingling or numbness, no shortness of breath, generalized weakness much improved.   Assessment  & Plan :   Generalized weakness neck down ongoing for 1 month responsive to high-dose steroids with normal CK levels.  Question PMR - she received some steroids recently  after which her weakness has improved, MRI of the spine shows nonspecific changes suggestive of possible underlying myeloproliferative disorder, have consulted oncology for further workup may require bone marrow biopsy, for now continue supportive care with IV fluids, PT OT and speech evaluation and monitor.  For now not challenging her again with steroids.  Hypertension.  On lisinopril .   Dyslipidemia.  On statin which is held       Condition - Extremely Guarded  Family Communication  : None present  Code Status : Full code  Consults  : Oncology  PUD Prophylaxis :    Procedures  :     MRI cervical/thoracic/lumbar spine W/WO: IMPRESSION: C-spine: Degenerative changes in the mid to lower cervical spine. Negative for abnormality of the cervical spine. Heterogeneous bone marrow signal. Most likely metastatic disease or myeloproliferative disorder. Thoracic spine: Diffuse abnormal appearance to the thoracic vertebral bodies without fractures. Consider diffuse metastatic disease or myeloproliferative disorder most likely. No fractures. Lumbar spine: Heterogeneous bone marrow signal throughout but no focal lesions. Differential considerations include myeloproliferative disorder and metastatic disease Overall disease is less prominent than in the thoracic spine.    CT chest/abdomen/pelvis w/contrast: IMPRESSION: 1. Right upper lobe nodule that is less than 3.0 mm in size. Stable left lower lobe nodule. According to the 2017 Fleischner criteria, if the patient is low risk, no routine follow-up is recommended. If the patient is high risk, an optional CT at 12 months is recommended. 2. No evidence of metastatic disease in the abdomen or pelvis. 3. Colonic diverticulosis without signs of diverticulitis. 4. Mild hepatosplenomegaly. 5. Extensive atherosclerosis including coronary artery disease.      Disposition Plan  :    Status is: Observation   DVT Prophylaxis  : Will add  heparin  from this afternoon.  SCDs Start: 07/14/24 0004    Lab Results  Component Value Date   PLT 394 07/14/2024    Diet :  Diet Order             Diet NPO time specified  Diet effective midnight                    Inpatient Medications  Scheduled Meds:  Chlorhexidine  Gluconate Cloth  6 each Topical Daily   lisinopril   40 mg Oral BID   sodium chloride  flush  3 mL Intravenous Q12H   Continuous Infusions:  lactated ringers      PRN Meds:.acetaminophen  **OR** acetaminophen , HYDROmorphone  (DILAUDID ) injection, ondansetron  **OR** ondansetron  (ZOFRAN ) IV, senna-docusate, traMADol   Antibiotics  :    Anti-infectives (From admission, onward)    None         Objective:   Vitals:   07/13/24 2230 07/13/24 2308 07/14/24 0000 07/14/24 0400  BP: (!) 162/59 (!) 162/59 (!) 173/62 (!) 127/52  Pulse: 63 65 69 (!) 56  Resp: 12 12 17 13   Temp: 98 F (36.7 C) 98 F (36.7 C) 98.2 F (36.8 C) 98.4 F (36.9 C)  TempSrc: Oral Oral Oral Oral  SpO2: 98%  99% 99%  Weight:  49.5 kg    Height:  5' 1 (1.549 m)      Wt Readings from Last 3 Encounters:  07/13/24 49.5 kg  07/10/24 50.3 kg  07/04/24 49.8 kg    No intake or output data in the 24 hours ending 07/14/24 0737   Physical Exam  Awake, minimally confused, No new F.N deficits, Normal affect Henry.AT,PERRAL Supple Neck, No JVD,   Symmetrical Chest wall movement, Good air movement bilaterally, CTAB RRR,No Gallops,Rubs or new Murmurs,  +ve B.Sounds, Abd Soft, No tenderness,   No Cyanosis, Clubbing or edema        Data Review:    Recent Labs  Lab 07/07/24 1254 07/14/24 0535  WBC 7.0 11.1*  HGB 10.6* 11.0*  HCT 33.4* 33.6*  PLT 291 394  MCV 91 86.8  MCH 28.7 28.4  MCHC 31.7 32.7  RDW 12.4 12.7  LYMPHSABS 0.8  --   EOSABS 0.1  --   BASOSABS 0.0  --     Recent Labs  Lab 07/07/24 1254 07/14/24 0535  NA 138 135  K 4.3 4.2  CL 103 102  CO2 22 23  ANIONGAP  --  10  GLUCOSE 103* 111*  BUN 31* 22   CREATININE 0.58 0.56  AST 15 17  ALT 16 27  ALKPHOS 72 61  BILITOT 0.2 0.5  ALBUMIN 4.0 2.7*  CRP  --  4.3*  TSH 1.450  --   CALCIUM 9.2 8.4*      Recent Labs  Lab 07/07/24 1254 07/14/24 0535  CRP  --  4.3*  TSH 1.450  --   CALCIUM 9.2 8.4*    --------------------------------------------------------------------------------------------------------------- Lab Results  Component Value Date   CHOL 176 05/24/2024   HDL 59 05/24/2024   LDLCALC 86 05/24/2024   TRIG 186 (H) 05/24/2024   CHOLHDL 3.0 05/24/2024    Lab Results  Component Value Date   HGBA1C 5.4 05/24/2024   No results for input(s): TSH, T4TOTAL, FREET4, T3FREE, THYROIDAB in the last 72 hours. No results for input(s): VITAMINB12, FOLATE, FERRITIN, TIBC, IRON , RETICCTPCT in the last 72 hours. ------------------------------------------------------------------------------------------------------------------ Cardiac Enzymes No results for input(s): CKMB, TROPONINI, MYOGLOBIN in the last 168 hours.  Invalid input(s): CK  Micro Results No results found for this or any previous visit (from the past 240 hours).  Radiology Report No results found.   Signature  -   Lavada Stank M.D on 07/14/2024 at 7:37 AM   -  To page go to [email or web address]

## 2024-07-14 NOTE — Hospital Course (Signed)
 Pamela [facility] is a 89 y.o. female with medical history significant for HTN, HLD, osteoporosis who is admitted from [facility] to pursue biopsy of heterogeneous bone marrow findings on MRI spine.

## 2024-07-15 ENCOUNTER — Encounter (HOSPITAL_COMMUNITY): Payer: Self-pay | Admitting: Internal Medicine

## 2024-07-15 DIAGNOSIS — E782 Mixed hyperlipidemia: Secondary | ICD-10-CM | POA: Diagnosis not present

## 2024-07-15 DIAGNOSIS — M899 Disorder of bone, unspecified: Secondary | ICD-10-CM | POA: Diagnosis not present

## 2024-07-15 DIAGNOSIS — R162 Hepatomegaly with splenomegaly, not elsewhere classified: Secondary | ICD-10-CM | POA: Diagnosis not present

## 2024-07-15 DIAGNOSIS — R531 Weakness: Secondary | ICD-10-CM | POA: Diagnosis not present

## 2024-07-15 DIAGNOSIS — I2510 Atherosclerotic heart disease of native coronary artery without angina pectoris: Secondary | ICD-10-CM | POA: Diagnosis not present

## 2024-07-15 DIAGNOSIS — R911 Solitary pulmonary nodule: Secondary | ICD-10-CM | POA: Diagnosis not present

## 2024-07-15 DIAGNOSIS — R9389 Abnormal findings on diagnostic imaging of other specified body structures: Secondary | ICD-10-CM | POA: Diagnosis not present

## 2024-07-15 DIAGNOSIS — D75839 Thrombocytosis, unspecified: Secondary | ICD-10-CM | POA: Diagnosis not present

## 2024-07-15 DIAGNOSIS — I10 Essential (primary) hypertension: Secondary | ICD-10-CM | POA: Diagnosis not present

## 2024-07-15 DIAGNOSIS — M810 Age-related osteoporosis without current pathological fracture: Secondary | ICD-10-CM | POA: Diagnosis not present

## 2024-07-15 DIAGNOSIS — K5730 Diverticulosis of large intestine without perforation or abscess without bleeding: Secondary | ICD-10-CM | POA: Diagnosis not present

## 2024-07-15 DIAGNOSIS — D649 Anemia, unspecified: Secondary | ICD-10-CM | POA: Diagnosis not present

## 2024-07-15 DIAGNOSIS — R937 Abnormal findings on diagnostic imaging of other parts of musculoskeletal system: Secondary | ICD-10-CM | POA: Diagnosis not present

## 2024-07-15 DIAGNOSIS — M47812 Spondylosis without myelopathy or radiculopathy, cervical region: Secondary | ICD-10-CM | POA: Diagnosis not present

## 2024-07-15 LAB — COMPREHENSIVE METABOLIC PANEL WITH GFR
ALT: 27 U/L (ref 0–44)
AST: 22 U/L (ref 15–41)
Albumin: 3.2 g/dL — ABNORMAL LOW (ref 3.5–5.0)
Alkaline Phosphatase: 62 U/L (ref 38–126)
Anion gap: 10 (ref 5–15)
BUN: 27 mg/dL — ABNORMAL HIGH (ref 8–23)
CO2: 24 mmol/L (ref 22–32)
Calcium: 9 mg/dL (ref 8.9–10.3)
Chloride: 107 mmol/L (ref 98–111)
Creatinine, Ser: 0.62 mg/dL (ref 0.44–1.00)
GFR, Estimated: >60 mL/min
Glucose, Bld: 98 mg/dL (ref 70–99)
Potassium: 3.8 mmol/L (ref 3.5–5.1)
Sodium: 141 mmol/L (ref 135–145)
Total Bilirubin: 0.6 mg/dL (ref 0.0–1.2)
Total Protein: 7 g/dL (ref 6.5–8.1)

## 2024-07-15 LAB — CBC WITH DIFFERENTIAL/PLATELET
Abs Immature Granulocytes: 0.18 10*3/uL — ABNORMAL HIGH (ref 0.00–0.07)
Basophils Absolute: 0 10*3/uL (ref 0.0–0.1)
Basophils Relative: 0 %
Eosinophils Absolute: 0 10*3/uL (ref 0.0–0.5)
Eosinophils Relative: 0 %
HCT: 37 % (ref 36.0–46.0)
Hemoglobin: 12 g/dL (ref 12.0–15.0)
Immature Granulocytes: 1 %
Lymphocytes Relative: 11 %
Lymphs Abs: 1.6 10*3/uL (ref 0.7–4.0)
MCH: 28.6 pg (ref 26.0–34.0)
MCHC: 32.4 g/dL (ref 30.0–36.0)
MCV: 88.3 fL (ref 80.0–100.0)
Monocytes Absolute: 1 10*3/uL (ref 0.1–1.0)
Monocytes Relative: 7 %
Neutro Abs: 11.3 10*3/uL — ABNORMAL HIGH (ref 1.7–7.7)
Neutrophils Relative %: 81 %
Platelets: 493 10*3/uL — ABNORMAL HIGH (ref 150–400)
RBC: 4.19 MIL/uL (ref 3.87–5.11)
RDW: 12.7 % (ref 11.5–15.5)
WBC: 14.1 10*3/uL — ABNORMAL HIGH (ref 4.0–10.5)
nRBC: 0 % (ref 0.0–0.2)

## 2024-07-15 LAB — PHOSPHORUS: Phosphorus: 1.7 mg/dL — ABNORMAL LOW (ref 2.5–4.6)

## 2024-07-15 LAB — MAGNESIUM: Magnesium: 2.2 mg/dL (ref 1.7–2.4)

## 2024-07-15 LAB — PROCALCITONIN: Procalcitonin: <0.1 ng/mL

## 2024-07-15 LAB — C-REACTIVE PROTEIN: CRP: 1.7 mg/dL — ABNORMAL HIGH

## 2024-07-15 MED ORDER — CARVEDILOL 3.125 MG PO TABS
3.1250 mg | ORAL_TABLET | Freq: Two times a day (BID) | ORAL | Status: DC
  Administered 2024-07-15 – 2024-07-16 (×3): 3.125 mg via ORAL
  Filled 2024-07-15 (×3): qty 1

## 2024-07-15 MED ORDER — TAMSULOSIN HCL 0.4 MG PO CAPS
0.4000 mg | ORAL_CAPSULE | Freq: Every day | ORAL | Status: DC
  Administered 2024-07-15 – 2024-07-16 (×2): 0.4 mg via ORAL
  Filled 2024-07-15 (×2): qty 1

## 2024-07-15 MED ORDER — AMLODIPINE BESYLATE 10 MG PO TABS
10.0000 mg | ORAL_TABLET | Freq: Every day | ORAL | Status: DC
  Administered 2024-07-15 – 2024-07-16 (×2): 10 mg via ORAL
  Filled 2024-07-15 (×2): qty 1

## 2024-07-15 MED ORDER — HYDRALAZINE HCL 20 MG/ML IJ SOLN: 10.0000 mg | Freq: Four times a day (QID) | INTRAMUSCULAR | Status: DC | PRN

## 2024-07-15 MED ORDER — LISINOPRIL 20 MG PO TABS
40.0000 mg | ORAL_TABLET | Freq: Every day | ORAL | Status: DC
  Administered 2024-07-15 – 2024-07-16 (×2): 40 mg via ORAL
  Filled 2024-07-15 (×2): qty 2

## 2024-07-15 NOTE — Care Management Obs Status (Addendum)
 MEDICARE OBSERVATION STATUS NOTIFICATION   Patient Details  Name: Pamela [facility] MRN: [id number] Date of Birth: 1933-04-06   Medicare Observation Status Notification Given:  Yes  MOON letter discussed with son due to patient confusion.   Robynn Eileen Hoose, RN 07/15/2024, 2:09 PM

## 2024-07-15 NOTE — Progress Notes (Signed)
   07/15/24 1025  Mobility  Activity Ambulated with assistance  Level of Assistance Standby assist, set-up cues, supervision of patient - no hands on (CG at times, pt drifting.)  Assistive Device None  Distance Ambulated (ft) 500 ft  Activity Response Tolerated well  Mobility Referral Yes  Mobility visit 1 Mobility  Mobility Specialist Start Time (ACUTE ONLY) 1025  Mobility Specialist Stop Time (ACUTE ONLY) 1055  Mobility Specialist Time Calculation (min) (ACUTE ONLY) 30 min   Mobility Specialist: Progress Note  Post-Mobility:    HR 72, SpO2 97% RA  Pt agreeable to mobility session - received in bed. C/o wanting to mobilize, not a fan of the chair alarm. Returned to chair with all needs met - call bell within reach. Chair alarm on. Son present.   Virgle Boards, BS Mobility Specialist Please contact via SecureChat or  Rehab office at [phone].

## 2024-07-15 NOTE — Progress Notes (Signed)
 PROGRESS NOTE                                                                                                                                                                                                             Patient Demographics:    Pamela Price, is a 89 y.o. female, DOB - 07/30/1933, FMW:[id number]  Outpatient Primary MD for the patient is Dottie Norleen [name], MD    LOS - 1  Admit date - 07/13/2024    No chief complaint on file.      Brief Narrative (HPI from H&P)   89 y.o. female with medical history significant for HTN, HLD, osteoporosis who presented to the [facility] ED for evaluation of generalized pain and weakness from neck down.   Patient states that for the last 3 weeks she has had generalized body pain and weakness from her neck down, primarily arthralgias and myalgias.  She has been evaluated by her PCP and orthopedic team.  She was given a short course of prednisone  which patient said significantly improved her symptoms but after she finished the course her symptoms returned.  She had recent lab workup which was largely unrevealing.    She was subsequently seen in the ER of [facility] where she had MRI CT and L-spine along with CT chest abdomen pelvis, on the CT there was a 3 mm nodule on the left lower lobe noted which was thought to be low risk however on the MRI of her spine there were changes suspicious for myeloproliferative disorder.  She was transferred to [facility] for further workup.   Subjective:   Patient in bed appears to be in no distress but slightly confused, denies any headache, chest or abdominal pain, body aches are better   Assessment  & Plan :   Generalized weakness neck down ongoing for 1 month responsive to high-dose steroids with normal CK levels.  Question PMR - she received some steroids recently after which her weakness has improved, MRI of the spine  shows nonspecific changes suggestive of possible underlying myeloproliferative disorder, have consulted oncology for further workup may require bone marrow biopsy which is likely to be done later on 07/15/2024, oncology has ordered multiple tumor markers which are pending, for now continue supportive care with IV fluids, PT OT and speech evaluation, continue IV steroids  and monitor  Hypertension.  Blood pressure running high added Norvasc , Coreg , continue lisinopril  but once a day along with as needed hydralazine .   Dyslipidemia.  On statin which is held       Condition - Extremely Guarded  Family Communication  : son [phone] Updated in detail on 07/15/2024  Code Status : Full code  Consults  : Oncology, IR  PUD Prophylaxis :    Procedures  :     MRI cervical/thoracic/lumbar spine W/WO: IMPRESSION: C-spine: Degenerative changes in the mid to lower cervical spine. Negative for abnormality of the cervical spine. Heterogeneous bone marrow signal. Most likely metastatic disease or myeloproliferative disorder. Thoracic spine: Diffuse abnormal appearance to the thoracic vertebral bodies without fractures. Consider diffuse metastatic disease or myeloproliferative disorder most likely. No fractures. Lumbar spine: Heterogeneous bone marrow signal throughout but no focal lesions. Differential considerations include myeloproliferative disorder and metastatic disease Overall disease is less prominent than in the thoracic spine.    CT chest/abdomen/pelvis w/contrast: IMPRESSION: 1. Right upper lobe nodule that is less than 3.0 mm in size. Stable left lower lobe nodule. According to the 2017 Fleischner criteria, if the patient is low risk, no routine follow-up is recommended. If the patient is high risk, an optional CT at 12 months is recommended. 2. No evidence of metastatic disease in the abdomen or pelvis. 3. Colonic diverticulosis without signs of diverticulitis. 4. Mild  hepatosplenomegaly. 5. Extensive atherosclerosis including coronary artery disease.      Disposition Plan  :    Status is: Observation   DVT Prophylaxis  : Will add heparin  from this afternoon.  heparin  injection 5,000 Units Start: 07/14/24 1400 SCDs Start: 07/14/24 0004    Lab Results  Component Value Date   PLT 394 07/14/2024    Diet :  Diet Order             Diet NPO time specified Except for: Sips with Meds  Diet effective midnight                    Inpatient Medications  Scheduled Meds:  Chlorhexidine  Gluconate Cloth  6 each Topical Daily   heparin  injection (subcutaneous)  5,000 Units Subcutaneous Q8H   lisinopril   40 mg Oral BID   methylPREDNISolone  (SOLU-MEDROL ) injection  40 mg Intravenous Q24H   sodium chloride  flush  3 mL Intravenous Q12H   Continuous Infusions:  lactated ringers  Stopped (07/14/24 1503)   PRN Meds:.acetaminophen  **OR** acetaminophen , HYDROmorphone  (DILAUDID ) injection, ondansetron  **OR** ondansetron  (ZOFRAN ) IV, senna-docusate, traMADol   Antibiotics  :    Anti-infectives (From admission, onward)    None         Objective:   Vitals:   07/14/24 1652 07/14/24 2000 07/15/24 0400 07/15/24 0731  BP: (!) 152/72   (!) 189/78  Pulse: 94     Resp: 20     Temp: 98 F (36.7 C) 99.2 F (37.3 C) 99.1 F (37.3 C) 98.1 F (36.7 C)  TempSrc: Oral Oral Oral Oral  SpO2: 95%     Weight:      Height:        Wt Readings from Last 3 Encounters:  07/13/24 49.5 kg  07/10/24 50.3 kg  07/04/24 49.8 kg     Intake/Output Summary (Last 24 hours) at 07/15/2024 0738 Last data filed at 07/15/2024 0414 Gross per 24 hour  Intake 444.32 ml  Output 1600 ml  Net -1155.68 ml     Physical Exam  Awake, minimally confused, No new F.N  deficits, Normal affect [place].AT,PERRAL Supple Neck, No JVD,   Symmetrical Chest wall movement, Good air movement bilaterally, CTAB RRR,No Gallops,Rubs or new Murmurs,  +ve B.Sounds, Abd Soft, No tenderness,    No Cyanosis, Clubbing or edema        Data Review:    Recent Labs  Lab 07/14/24 0535  WBC 11.1*  HGB 11.0*  HCT 33.6*  PLT 394  MCV 86.8  MCH 28.4  MCHC 32.7  RDW 12.7    Recent Labs  Lab 07/14/24 0535 07/14/24 0839  NA 135  --   K 4.2  --   CL 102  --   CO2 23  --   ANIONGAP 10  --   GLUCOSE 111*  --   BUN 22  --   CREATININE 0.56  --   AST 17  --   ALT 27  --   ALKPHOS 61  --   BILITOT 0.5  --   ALBUMIN 2.7*  --   CRP 4.3* 4.4*  PROCALCITON  --  <0.10  CALCIUM 8.4*  --       Recent Labs  Lab 07/14/24 0535 07/14/24 0839  CRP 4.3* 4.4*  PROCALCITON  --  <0.10  CALCIUM 8.4*  --     --------------------------------------------------------------------------------------------------------------- Lab Results  Component Value Date   CHOL 176 05/24/2024   HDL 59 05/24/2024   LDLCALC 86 05/24/2024   TRIG 186 (H) 05/24/2024   CHOLHDL 3.0 05/24/2024    Lab Results  Component Value Date   HGBA1C 5.4 05/24/2024   No results for input(s): TSH, T4TOTAL, FREET4, T3FREE, THYROIDAB in the last 72 hours. Recent Labs    07/14/24 1430  RETICCTPCT 1.5   ------------------------------------------------------------------------------------------------------------------ Cardiac Enzymes No results for input(s): CKMB, TROPONINI, MYOGLOBIN in the last 168 hours.  Invalid input(s): CK  Micro Results No results found for this or any previous visit (from the past 240 hours).  Radiology Report No results found.   Signature  -   Lavada Stank M.D on 07/15/2024 at 7:38 AM   -  To page go to [email or web address]

## 2024-07-16 ENCOUNTER — Other Ambulatory Visit (HOSPITAL_COMMUNITY): Payer: Self-pay

## 2024-07-16 ENCOUNTER — Telehealth: Payer: Self-pay

## 2024-07-16 ENCOUNTER — Telehealth: Payer: Self-pay | Admitting: Oncology

## 2024-07-16 ENCOUNTER — Observation Stay (HOSPITAL_COMMUNITY)

## 2024-07-16 DIAGNOSIS — M47812 Spondylosis without myelopathy or radiculopathy, cervical region: Secondary | ICD-10-CM | POA: Diagnosis not present

## 2024-07-16 DIAGNOSIS — D7589 Other specified diseases of blood and blood-forming organs: Secondary | ICD-10-CM | POA: Diagnosis not present

## 2024-07-16 DIAGNOSIS — D649 Anemia, unspecified: Secondary | ICD-10-CM | POA: Diagnosis not present

## 2024-07-16 DIAGNOSIS — K5730 Diverticulosis of large intestine without perforation or abscess without bleeding: Secondary | ICD-10-CM | POA: Diagnosis not present

## 2024-07-16 DIAGNOSIS — D6489 Other specified anemias: Secondary | ICD-10-CM | POA: Diagnosis not present

## 2024-07-16 DIAGNOSIS — R162 Hepatomegaly with splenomegaly, not elsewhere classified: Secondary | ICD-10-CM | POA: Diagnosis not present

## 2024-07-16 DIAGNOSIS — R9389 Abnormal findings on diagnostic imaging of other specified body structures: Secondary | ICD-10-CM | POA: Diagnosis not present

## 2024-07-16 DIAGNOSIS — R531 Weakness: Secondary | ICD-10-CM | POA: Diagnosis not present

## 2024-07-16 DIAGNOSIS — R937 Abnormal findings on diagnostic imaging of other parts of musculoskeletal system: Secondary | ICD-10-CM | POA: Diagnosis not present

## 2024-07-16 DIAGNOSIS — D75839 Thrombocytosis, unspecified: Secondary | ICD-10-CM | POA: Diagnosis not present

## 2024-07-16 DIAGNOSIS — M899 Disorder of bone, unspecified: Secondary | ICD-10-CM | POA: Diagnosis not present

## 2024-07-16 DIAGNOSIS — E782 Mixed hyperlipidemia: Secondary | ICD-10-CM | POA: Diagnosis not present

## 2024-07-16 DIAGNOSIS — R911 Solitary pulmonary nodule: Secondary | ICD-10-CM | POA: Diagnosis not present

## 2024-07-16 DIAGNOSIS — I10 Essential (primary) hypertension: Secondary | ICD-10-CM | POA: Diagnosis not present

## 2024-07-16 DIAGNOSIS — M810 Age-related osteoporosis without current pathological fracture: Secondary | ICD-10-CM | POA: Diagnosis not present

## 2024-07-16 DIAGNOSIS — I2510 Atherosclerotic heart disease of native coronary artery without angina pectoris: Secondary | ICD-10-CM | POA: Diagnosis not present

## 2024-07-16 LAB — CBC WITH DIFFERENTIAL/PLATELET
Abs Immature Granulocytes: 0.17 10*3/uL — ABNORMAL HIGH (ref 0.00–0.07)
Basophils Absolute: 0 10*3/uL (ref 0.0–0.1)
Basophils Relative: 0 %
Eosinophils Absolute: 0 10*3/uL (ref 0.0–0.5)
Eosinophils Relative: 0 %
HCT: 33.2 % — ABNORMAL LOW (ref 36.0–46.0)
Hemoglobin: 10.8 g/dL — ABNORMAL LOW (ref 12.0–15.0)
Immature Granulocytes: 2 %
Lymphocytes Relative: 10 %
Lymphs Abs: 0.9 10*3/uL (ref 0.7–4.0)
MCH: 28.6 pg (ref 26.0–34.0)
MCHC: 32.5 g/dL (ref 30.0–36.0)
MCV: 88.1 fL (ref 80.0–100.0)
Monocytes Absolute: 0.4 10*3/uL (ref 0.1–1.0)
Monocytes Relative: 4 %
Neutro Abs: 8.3 10*3/uL — ABNORMAL HIGH (ref 1.7–7.7)
Neutrophils Relative %: 84 %
Platelets: 405 10*3/uL — ABNORMAL HIGH (ref 150–400)
RBC: 3.77 MIL/uL — ABNORMAL LOW (ref 3.87–5.11)
RDW: 13 % (ref 11.5–15.5)
WBC: 9.8 10*3/uL (ref 4.0–10.5)
nRBC: 0 % (ref 0.0–0.2)

## 2024-07-16 LAB — COMPREHENSIVE METABOLIC PANEL WITH GFR
ALT: 31 U/L (ref 0–44)
AST: 24 U/L (ref 15–41)
Albumin: 2.8 g/dL — ABNORMAL LOW (ref 3.5–5.0)
Alkaline Phosphatase: 51 U/L (ref 38–126)
Anion gap: 7 (ref 5–15)
BUN: 30 mg/dL — ABNORMAL HIGH (ref 8–23)
CO2: 23 mmol/L (ref 22–32)
Calcium: 8.5 mg/dL — ABNORMAL LOW (ref 8.9–10.3)
Chloride: 106 mmol/L (ref 98–111)
Creatinine, Ser: 0.55 mg/dL (ref 0.44–1.00)
GFR, Estimated: >60 mL/min
Glucose, Bld: 123 mg/dL — ABNORMAL HIGH (ref 70–99)
Potassium: 4.7 mmol/L (ref 3.5–5.1)
Sodium: 136 mmol/L (ref 135–145)
Total Bilirubin: 0.2 mg/dL (ref 0.0–1.2)
Total Protein: 5.9 g/dL — ABNORMAL LOW (ref 6.5–8.1)

## 2024-07-16 LAB — CEA: CEA: 1 ng/mL (ref 0.0–4.7)

## 2024-07-16 LAB — PROCALCITONIN: Procalcitonin: <0.1 ng/mL

## 2024-07-16 LAB — C-REACTIVE PROTEIN: CRP: 0.8 mg/dL

## 2024-07-16 LAB — PHOSPHORUS: Phosphorus: 2.5 mg/dL (ref 2.5–4.6)

## 2024-07-16 LAB — CANCER ANTIGEN 19-9: CA 19-9: 10 U/mL (ref 0–35)

## 2024-07-16 LAB — MAGNESIUM: Magnesium: 2.2 mg/dL (ref 1.7–2.4)

## 2024-07-16 LAB — CA 125: Cancer Antigen (CA) 125: 7.6 U/mL (ref 0.0–38.1)

## 2024-07-16 MED ORDER — POTASSIUM PHOSPHATES 15 MMOLE/5ML IV SOLN
30.0000 mmol | Freq: Once | INTRAVENOUS | Status: AC
  Administered 2024-07-16: 30 mmol via INTRAVENOUS
  Filled 2024-07-16: qty 10

## 2024-07-16 MED ORDER — AMLODIPINE BESYLATE 10 MG PO TABS
10.0000 mg | ORAL_TABLET | Freq: Every day | ORAL | 0 refills | Status: AC
  Filled 2024-07-16: qty 30, 30d supply, fill #0

## 2024-07-16 MED ORDER — TRAMADOL HCL 50 MG PO TABS
50.0000 mg | ORAL_TABLET | Freq: Two times a day (BID) | ORAL | 0 refills | Status: AC | PRN
  Filled 2024-07-16: qty 10, 5d supply, fill #0

## 2024-07-16 MED ORDER — LIDOCAINE-EPINEPHRINE (PF) 1 %-1:200000 IJ SOLN: 20.0000 mL | Freq: Once | INTRAMUSCULAR | Status: DC

## 2024-07-16 MED ORDER — CARVEDILOL 3.125 MG PO TABS
3.1250 mg | ORAL_TABLET | Freq: Two times a day (BID) | ORAL | 0 refills | Status: AC
  Filled 2024-07-16: qty 60, 30d supply, fill #0

## 2024-07-16 MED ORDER — SODIUM ZIRCONIUM CYCLOSILICATE 5 G PO PACK
5.0000 g | PACK | Freq: Once | ORAL | Status: DC
  Filled 2024-07-16: qty 1

## 2024-07-16 MED ORDER — ACETAMINOPHEN 325 MG PO TABS
650.0000 mg | ORAL_TABLET | Freq: Four times a day (QID) | ORAL | 0 refills | Status: AC | PRN
  Filled 2024-07-16: qty 20, 3d supply, fill #0

## 2024-07-16 MED ORDER — LIDOCAINE-EPINEPHRINE 1 %-1:100000 IJ SOLN
INTRAMUSCULAR | Status: AC
  Filled 2024-07-16: qty 1

## 2024-07-16 MED ORDER — PREDNISONE 10 MG PO TABS
30.0000 mg | ORAL_TABLET | Freq: Every day | ORAL | 0 refills | Status: DC
  Filled 2024-07-16: qty 21, 7d supply, fill #0

## 2024-07-16 MED ORDER — MIDAZOLAM HCL 2 MG/2ML IJ SOLN
INTRAMUSCULAR | Status: AC | PRN
  Administered 2024-07-16: 1 mg via INTRAVENOUS

## 2024-07-16 MED ORDER — TAMSULOSIN HCL 0.4 MG PO CAPS
0.4000 mg | ORAL_CAPSULE | Freq: Every day | ORAL | 0 refills | Status: AC
  Filled 2024-07-16: qty 30, 30d supply, fill #0

## 2024-07-16 MED ORDER — PRAVASTATIN SODIUM 40 MG PO TABS: 40.0000 mg | ORAL_TABLET | Freq: Every day | ORAL | Status: DC

## 2024-07-16 MED ORDER — MIDAZOLAM HCL 2 MG/2ML IJ SOLN
INTRAMUSCULAR | Status: AC
  Filled 2024-07-16: qty 2

## 2024-07-16 MED ORDER — FENTANYL CITRATE (PF) 100 MCG/2ML IJ SOLN
INTRAMUSCULAR | Status: AC | PRN
  Administered 2024-07-16: 50 ug via INTRAVENOUS

## 2024-07-16 MED ORDER — FENTANYL CITRATE (PF) 100 MCG/2ML IJ SOLN
INTRAMUSCULAR | Status: AC
  Filled 2024-07-16: qty 2

## 2024-07-16 NOTE — Discharge Instructions (Signed)
 Follow with Primary MD Dottie Norleen MANO II, MD in 3-4 days follow bone marrow biopsy results,  Get CBC, CMP, Magnesium, 2 view Chest X ray -  checked next visit with your primary MD    Activity: As tolerated with Full fall precautions use walker/cane & assistance as needed  Disposition Home    Diet: Heart Healthy    Special Instructions: If you have smoked or chewed Tobacco  in the last 2 yrs please stop smoking, stop any regular Alcohol  and or any Recreational drug use.  On your next visit with your primary care physician please Get Medicines reviewed and adjusted.  Please request your Prim.MD to go over all Hospital Tests and Procedure/Radiological results at the follow up, please get all Hospital records sent to your Prim MD by signing hospital release before you go home.  If you experience worsening of your admission symptoms, develop shortness of breath, life threatening emergency, suicidal or homicidal thoughts you must seek medical attention immediately by calling 911 or calling your MD immediately  if symptoms less severe.  You Must read complete instructions/literature along with all the possible adverse reactions/side effects for all the Medicines you take and that have been prescribed to you. Take any new Medicines after you have completely understood and accpet all the possible adverse reactions/side effects.   Do not drive when taking Pain medications.  Do not take more than prescribed Pain, Sleep and Anxiety Medications  Wear Seat belts while driving.

## 2024-07-16 NOTE — Progress Notes (Signed)
 PROGRESS NOTE                                                                                                                                                                                                             Patient Demographics:    Pamela Price, is a 89 y.o. female, DOB - [birth year], FMW:[id number]  Outpatient Primary MD for the patient is Dottie Norleen [name], MD    LOS - 1  Admit date - 07/13/2024    No chief complaint on file.      Brief Narrative (HPI from H&P)   89 y.o. female with medical history significant for HTN, HLD, osteoporosis who presented to the [facility] ED for evaluation of generalized pain and weakness from neck down.   Patient states that for the last 3 weeks she has had generalized body pain and weakness from her neck down, primarily arthralgias and myalgias.  She has been evaluated by her PCP and orthopedic team.  She was given a short course of prednisone  which patient said significantly improved her symptoms but after she finished the course her symptoms returned.  She had recent lab workup which was largely unrevealing.    She was subsequently seen in the ER of [facility] where she had MRI CT and L-spine along with CT chest abdomen pelvis, on the CT there was a 3 mm nodule on the left lower lobe noted which was thought to be low risk however on the MRI of her spine there were changes suspicious for myeloproliferative disorder.  She was transferred to [facility] for further workup.   Subjective:   Patient in bed, appears comfortable but mildly confused, denies any headache, no fever, no chest pain or pressure, no shortness of breath , no abdominal pain. No new focal weakness,  body aches are better.   Assessment  & Plan :   Generalized weakness neck down ongoing for 1 month responsive to high-dose steroids with normal CK levels.  Question PMR - she received some  steroids recently after which her weakness has improved, MRI of the spine shows nonspecific changes suggestive of possible underlying myeloproliferative disorder, have consulted oncology for further workup may require bone marrow biopsy which is likely to be done later on 07/16/2024, oncology has ordered multiple tumor markers which are pending, for now continue supportive  care with IV fluids, PT OT and speech evaluation, continue IV steroids and monitor  Hypertension.  Blood pressure running high added Norvasc , Coreg , continue lisinopril  but once a day along with as needed hydralazine .   Dyslipidemia.  On statin which is held       Condition - Extremely Guarded  Family Communication  : son [phone] Updated in detail on 07/15/2024, message left on 07/16/2024 at 7:28 AM.  Code Status : Full code  Consults  : Oncology, IR  PUD Prophylaxis :    Procedures  :     MRI cervical/thoracic/lumbar spine W/WO: IMPRESSION: C-spine: Degenerative changes in the mid to lower cervical spine. Negative for abnormality of the cervical spine. Heterogeneous bone marrow signal. Most likely metastatic disease or myeloproliferative disorder. Thoracic spine: Diffuse abnormal appearance to the thoracic vertebral bodies without fractures. Consider diffuse metastatic disease or myeloproliferative disorder most likely. No fractures. Lumbar spine: Heterogeneous bone marrow signal throughout but no focal lesions. Differential considerations include myeloproliferative disorder and metastatic disease Overall disease is less prominent than in the thoracic spine.    CT chest/abdomen/pelvis w/contrast: IMPRESSION: 1. Right upper lobe nodule that is less than 3.0 mm in size. Stable left lower lobe nodule. According to the 2017 Fleischner criteria, if the patient is low risk, no routine follow-up is recommended. If the patient is high risk, an optional CT at 12 months is recommended. 2. No evidence of metastatic disease  in the abdomen or pelvis. 3. Colonic diverticulosis without signs of diverticulitis. 4. Mild hepatosplenomegaly. 5. Extensive atherosclerosis including coronary artery disease.      Disposition Plan  :    Status is: Observation   DVT Prophylaxis  : Will add heparin  from this afternoon.  heparin  injection 5,000 Units Start: 07/14/24 1400 SCDs Start: 07/14/24 0004    Lab Results  Component Value Date   PLT 405 (H) 07/16/2024    Diet :  Diet Order             Diet NPO time specified Except for: Sips with Meds  Diet effective midnight                    Inpatient Medications  Scheduled Meds:  amLODipine   10 mg Oral Daily   carvedilol   3.125 mg Oral BID WC   Chlorhexidine  Gluconate Cloth  6 each Topical Daily   heparin  injection (subcutaneous)  5,000 Units Subcutaneous Q8H   lisinopril   40 mg Oral Daily   methylPREDNISolone  (SOLU-MEDROL ) injection  40 mg Intravenous Q24H   sodium chloride  flush  3 mL Intravenous Q12H   tamsulosin   0.4 mg Oral Daily   Continuous Infusions:  potassium PHOSPHATE  IVPB (in mmol)     PRN Meds:.acetaminophen  **OR** acetaminophen , hydrALAZINE , ondansetron  **OR** ondansetron  (ZOFRAN ) IV, senna-docusate, traMADol   Antibiotics  :    Anti-infectives (From admission, onward)    None         Objective:   Vitals:   07/15/24 1650 07/15/24 1909 07/15/24 2000 07/16/24 0000  BP: 115/61  (!) 113/54 (!) 106/51  Pulse: 72 73 70 (!) 55  Resp: 15  (!) 24 16  Temp:   97.7 F (36.5 C)   TempSrc:   Oral   SpO2: 96%  96% 96%  Weight:      Height:        Wt Readings from Last 3 Encounters:  07/13/24 49.5 kg  07/10/24 50.3 kg  07/04/24 49.8 kg     Intake/Output Summary (Last 24 hours)  at 07/16/2024 0727 Last data filed at 07/16/2024 0325 Gross per 24 hour  Intake --  Output 1575 ml  Net -1575 ml     Physical Exam  Awake, minimally confused, No new F.N deficits, Normal affect [place].AT,PERRAL Supple Neck, No JVD,   Symmetrical  Chest wall movement, Good air movement bilaterally, CTAB RRR,No Gallops,Rubs or new Murmurs,  +ve B.Sounds, Abd Soft, No tenderness,   No Cyanosis, Clubbing or edema        Data Review:    Recent Labs  Lab 07/14/24 0535 07/15/24 0942 07/16/24 0540  WBC 11.1* 14.1* 9.8  HGB 11.0* 12.0 10.8*  HCT 33.6* 37.0 33.2*  PLT 394 493* 405*  MCV 86.8 88.3 88.1  MCH 28.4 28.6 28.6  MCHC 32.7 32.4 32.5  RDW 12.7 12.7 13.0  LYMPHSABS  --  1.6 0.9  MONOABS  --  1.0 0.4  EOSABS  --  0.0 0.0  BASOSABS  --  0.0 0.0    Recent Labs  Lab 07/14/24 0535 07/14/24 0839 07/15/24 0942 07/16/24 0540  NA 135  --  141 136  K 4.2  --  3.8 4.7  CL 102  --  107 106  CO2 23  --  24 23  ANIONGAP 10  --  10 7  GLUCOSE 111*  --  98 123*  BUN 22  --  27* 30*  CREATININE 0.56  --  0.62 0.55  AST 17  --  22 24  ALT 27  --  27 31  ALKPHOS 61  --  62 51  BILITOT 0.5  --  0.6 0.2  ALBUMIN 2.7*  --  3.2* 2.8*  CRP 4.3* 4.4* 1.7* 0.8  PROCALCITON  --  <0.10 <0.10  --   MG  --   --  2.2 2.2  PHOS  --   --  1.7* 2.5  CALCIUM 8.4*  --  9.0 8.5*      Recent Labs  Lab 07/14/24 0535 07/14/24 0839 07/15/24 0942 07/16/24 0540  CRP 4.3* 4.4* 1.7* 0.8  PROCALCITON  --  <0.10 <0.10  --   MG  --   --  2.2 2.2  CALCIUM 8.4*  --  9.0 8.5*    --------------------------------------------------------------------------------------------------------------- Lab Results  Component Value Date   CHOL 176 05/24/2024   HDL 59 05/24/2024   LDLCALC 86 05/24/2024   TRIG 186 (H) 05/24/2024   CHOLHDL 3.0 05/24/2024    Lab Results  Component Value Date   HGBA1C 5.4 05/24/2024   No results for input(s): TSH, T4TOTAL, FREET4, T3FREE, THYROIDAB in the last 72 hours. Recent Labs    07/14/24 1430  RETICCTPCT 1.5   ------------------------------------------------------------------------------------------------------------------ Cardiac Enzymes No results for input(s): CKMB, TROPONINI,  MYOGLOBIN in the last 168 hours.  Invalid input(s): CK  Micro Results No results found for this or any previous visit (from the past 240 hours).  Radiology Report No results found.   Signature  -   Lavada Stank M.D on 07/16/2024 at 7:27 AM   -  To page go to [email or web address]

## 2024-07-16 NOTE — Progress Notes (Signed)
 Reviewed AVS, patient expressed understanding of medications, MD follow up reviewed.    Patient states all belongings brought to the hospital at time of admission are accounted for and packed to take home.   Patient informed and expressed understanding to pick up discharge medications in [facility] pharmacy before leaving hospital.    Potassium Phosphate  currently running; patient requested to stop IV infusion. Contacted Dr. Dennise; he requested patient finish before discharging.  Informed patient.

## 2024-07-16 NOTE — Telephone Encounter (Signed)
 07/16/24 Spoke with son(Michael)and confirmed New patient appt.

## 2024-07-16 NOTE — Progress Notes (Signed)
 Patient is not in room and had already left unit for procedure during bedside report.

## 2024-07-16 NOTE — Progress Notes (Signed)
 Patient returned from procedure area, alert and oriented. She denied any distress. Biopsied site appears CDI. Please see Flow sheet for VS. Breakfast ordered per requested.

## 2024-07-16 NOTE — Procedures (Signed)
Interventional Radiology Procedure Note  Procedure: Fluoroscopic guided aspirate and core biopsy of right iliac bone  Complications: None  Recommendations: - Bedrest supine x 1 hrs - Hydrocodone PRN  Pain - Follow biopsy results    , MD   

## 2024-07-16 NOTE — Telephone Encounter (Signed)
 Spoke with pt's son to schedule a telephone visit with Dr. Lanny for next week.  Stated Dr. Lanny would like to f/u with pt next week until the pt can be scheduled with one of the Oncology providers at our [facility] location.  Pt's son verbalized understanding.  Stated that Dr. Lanny has availability on Tuesday 07/23/2024 at 0815.  Pt's son confirmed appt and stated he an pt will await Dr. Demetra call on 07/23/2024.  Pt's son had no further questions or concerns at this time.

## 2024-07-16 NOTE — TOC Transition Note (Addendum)
 Transition of Care [facility]) - Discharge Note   Patient Details  Name: Pamela Price MRN: [id number] Date of Birth: February 14, 1933  Transition of [facility] CM/SW Contact:  Marval Gell, RN Phone Number: 07/16/2024, 11:16 AM   Clinical Narrative:     [place] w patient and son at bedside. Patient states that she has a RW at home. They are agreeable to [facility] services, no preference, [facility] accepted. Son states that she will have 24 hour supervision at home.  No other ICM needs identified  Discussed HH order w Dr Dennise, removed [facility] RN order   Final next level of care: Home w Home Health Services Barriers to Discharge: No Barriers Identified   Patient Goals and CMS Choice Patient states their goals for this hospitalization and ongoing recovery are:: to go home CMS Medicare.gov Compare Post Acute Care list provided to:: Patient Choice offered to / list presented to : Patient      Discharge Placement                       Discharge Plan and Services Additional resources added to the After Visit Summary for     Discharge Planning Services: CM Consult            DME Arranged: N/A         HH Arranged: PT, RN HH Agency: [facility] Date [facility] Agency Contacted: 07/16/24 Time HH Agency Contacted: 1116 Representative spoke with at [facility] Agency: Arna  Social Drivers of Health (SDOH) Interventions SDOH Screenings   Food Insecurity: No Food Insecurity (07/14/2024)  Housing: Low Risk  (07/14/2024)  Transportation Needs: No Transportation Needs (07/14/2024)  Utilities: Not At Risk (07/14/2024)  Alcohol Screen: Low Risk  (09/12/2023)  Depression (PHQ2-9): Low Risk  (07/04/2024)  Financial Resource Strain: Low Risk  (07/09/2024)  Physical Activity: Insufficiently Active (07/09/2024)  Social Connections: Socially Isolated (07/14/2024)  Stress: Stress Concern Present (07/09/2024)  Tobacco Use: Medium Risk (07/14/2024)   Received from [facility] Literacy: Adequate Health Literacy  (09/12/2023)     Readmission Risk Interventions     No data to display

## 2024-07-16 NOTE — Discharge Summary (Addendum)
 Pamela Price FMW:[id number] DOB: 20-Jan-1933 DOA: 07/13/2024  PCP: Pamela Price  Admit date: 07/13/2024  Discharge date: 07/16/2024  Admitted From: Home   Disposition:  Home   Recommendations for Outpatient Follow-up:   Follow up with PCP in 1-2 weeks  PCP Please obtain BMP/CBC, 2 view CXR in 1week,  (see Discharge instructions)   PCP Please follow up on the following pending results: These follow-up on bone marrow biopsy results, must follow-up with oncology in the outpatient setting within a week of discharge.   Home Health: PT, RN if qulifies   Equipment/Devices: walker  Consultations: Onc, IR Discharge Condition: Stable    CODE STATUS: Full    Diet Recommendation: Heart Healthy     CC - muscle aches   Brief history of present illness from the day of admission and additional interim summary     89 y.o. female with medical history significant for HTN, HLD, osteoporosis who presented to the [facility] ED for evaluation of generalized pain and weakness from neck down.   Patient states that for the last 3 weeks she has had generalized body pain and weakness from her neck down, primarily arthralgias and myalgias.  She has been evaluated by her PCP and orthopedic team.  She was given a short course of prednisone  which patient said significantly improved her symptoms but after she finished the course her symptoms returned.  She had recent lab workup which was largely unrevealing.     She was subsequently seen in the ER of [facility] where she had MRI CT and L-spine along with CT chest abdomen pelvis, on the CT there was a 3 mm nodule on the left lower lobe noted which was thought to be low risk however on the MRI of her spine there were changes suspicious for myeloproliferative disorder.   She was transferred to [facility] for further workup.                                                                 Hospital Course   Generalized weakness neck down ongoing for 1 month responsive to high-dose steroids with normal CK levels.  Question PMR - she received some steroids recently after which her weakness has improved, MRI of the spine shows nonspecific changes suggestive of possible underlying myeloproliferative disorder, consulted oncology and IR underwent bone marrow biopsy today, symptom-free otherwise, patient and son eager to be discharged home and they are symptom-free they said they will follow-up with PCP and oncology outpatient for results.  Will be discharged home on moderate dose oral steroid for muscle ache relief, she seems to be pain-free whenever she is on moderate doses of steroids, will give her 7-day supply thereafter PCP.  Question if this is a paraneoplastic syndrome.  She will  also follow-up with oncology within a week for follow-up.  Note her CK levels were stable.   Hypertension.  Blood pressure was high upon admission placed on blood pressure regimen as below, PCP to monitor and adjust..   Dyslipidemia.  On statin which is held  Procalcitonin Collected: 07/16/24 0540  Final result     Procalcitonin <0.10 ng/mL          07/16/24 0736  CEA Collected: 07/14/24 1430  Final result  Specimen: Blood   CEA 1.0 ng/mL          07/16/24 0736  Cancer antigen 19-9 Collected: 07/14/24 1430  Final result  Specimen: Blood   CA 19-9 10 U/mL          07/16/24 0736  CA 125 Collected: 07/14/24 1430  Final result  Specimen: Blood   Cancer Antigen (CA) 125 7.6 U/mL          07/16/24 0657  C-reactive protein Collected: 07/16/24 0540  Final result  Specimen: Blood   CRP 0.8 mg/dL       Latest Reference Range & Units 07/14/24 05:35  CK Total 38 - 234 U/L 19 (L)       Discharge diagnosis     Principal Problem:   Abnormal MRI, spine Active  Problems:   Primary hypertension   Mixed hyperlipidemia   Acute generalized body pain    Discharge instructions    Discharge Instructions     Diet - low sodium heart healthy   Complete by: As directed    Discharge instructions   Complete by: As directed    Follow with Primary Price Pamela Price in 3-4 days follow bone marrow biopsy results,  Get CBC, CMP, Magnesium, 2 view Chest X ray -  checked next visit with your primary Price    Activity: As tolerated with Full fall precautions use walker/cane & assistance as needed  Disposition Home    Diet: Heart Healthy    Special Instructions: If you have smoked or chewed Tobacco  in the last 2 yrs please stop smoking, stop any regular Alcohol  and or any Recreational drug use.  On your next visit with your primary care physician please Get Medicines reviewed and adjusted.  Please request your Prim.Price to go over all Hospital Tests and Procedure/Radiological results at the follow up, please get all Hospital records sent to your Prim Price by signing hospital release before you go home.  If you experience worsening of your admission symptoms, develop shortness of breath, life threatening emergency, suicidal or homicidal thoughts you must seek medical attention immediately by calling 911 or calling your Price immediately  if symptoms less severe.  You Must read complete instructions/literature along with all the possible adverse reactions/side effects for all the Medicines you take and that have been prescribed to you. Take any new Medicines after you have completely understood and accpet all the possible adverse reactions/side effects.   Do not drive when taking Pain medications.  Do not take more than prescribed Pain, Sleep and Anxiety Medications  Wear Seat belts while driving.   Increase activity slowly   Complete by: As directed    No wound care   Complete by: As directed        Discharge Medications   Allergies as of 07/16/2024        Reactions   Sulfa Antibiotics Rash        Medication List     STOP taking these medications  oxyCODONE  5 MG immediate release tablet Commonly known as: Oxy IR/ROXICODONE        TAKE these medications    acetaminophen  325 MG tablet Commonly known as: TYLENOL  Take 2 tablets (650 mg total) by mouth every 6 (six) hours as needed for mild pain (pain score 1-3) or fever (or Fever >/= 101).   amLODipine  10 MG tablet Commonly known as: NORVASC  Take 1 tablet (10 mg total) by mouth daily. Start taking on: July 17, 2024   carvedilol  3.125 MG tablet Commonly known as: COREG  Take 1 tablet (3.125 mg total) by mouth 2 (two) times daily with a meal.   denosumab  60 MG/ML Sosy injection Commonly known as: PROLIA  Inject 60 mg into the skin every 6 (six) months.   lisinopril  40 MG tablet Commonly known as: ZESTRIL  TAKE 1 TABLET BY MOUTH TWICE A DAY   pravastatin  40 MG tablet Commonly known as: PRAVACHOL  Take 1 tablet (40 mg total) by mouth daily. Start taking on: July 31, 2024 What changed:  See the new instructions. These instructions start on July 31, 2024. If you are unsure what to do until then, ask your doctor or other care provider.   predniSONE  10 MG tablet Commonly known as: DELTASONE  Take 3 tablets (30 mg total) by mouth daily with breakfast for 7 days. What changed:  medication strength how much to take   tamsulosin  0.4 MG Caps capsule Commonly known as: FLOMAX  Take 1 capsule (0.4 mg total) by mouth daily. Start taking on: July 17, 2024   traMADol  50 MG tablet Commonly known as: ULTRAM  Take 1 tablet (50 mg total) by mouth every 12 (twelve) hours as needed for moderate pain (pain score 4-6).   UNABLE TO FIND Place 1 Dose into both eyes every 3 (three) months. Eye injections every 3 months               Durable Medical Equipment  (From admission, onward)           Start     Ordered   07/16/24 1017  DME Walker  Once       Question  Answer Comment  Walker: With 5 Inch Wheels   Patient needs a walker to treat with the following condition Weakness      07/16/24 1017             Follow-up Information     Pamela Price. Schedule an appointment as soon as possible for a visit in 3 day(s).   Specialty: Family Medicine Contact information: [address], [address] [place] [address] [phone]         Lanny Callander, Price. Schedule an appointment as soon as possible for a visit in 1 week(s).   Specialties: Hematology, Oncology Why: Or with local oncologist Dr. Cornelius. Contact information: [address] [place] [address] [phone]                 Major procedures and Radiology Reports - PLEASE review detailed and final reports thoroughly  -        No results found.  Micro Results    No results found for this or any previous visit (from the past 240 hours).  Today   Subjective    Pamela Price today has no headache,no chest abdominal pain,no new weakness tingling or numbness, feels much better wants to go home today.    Objective   Blood pressure (!) 144/55, pulse 71, temperature 98.1 F (36.7 C), temperature source  Oral, resp. rate 18, height 5' 1 (1.549 m), weight 49.5 kg, SpO2 97%.   Intake/Output Summary (Last 24 hours) at 07/16/2024 1021 Last data filed at 07/16/2024 1000 Gross per 24 hour  Intake 240 ml  Output 775 ml  Net -535 ml    Exam  Awake Alert, No new F.N deficits,    [place].AT,PERRAL Supple Neck,   Symmetrical Chest wall movement, Good air movement bilaterally, CTAB RRR,No Gallops,   +ve B.Sounds, Abd Soft, Non tender,  No Cyanosis, Clubbing or edema    Data Review   Recent Labs  Lab 07/14/24 0535 07/15/24 0942 07/16/24 0540  WBC 11.1* 14.1* 9.8  HGB 11.0* 12.0 10.8*  HCT 33.6* 37.0 33.2*  PLT 394 493* 405*  MCV 86.8 88.3 88.1  MCH 28.4 28.6 28.6  MCHC 32.7 32.4 32.5  RDW 12.7 12.7 13.0  LYMPHSABS  --  1.6 0.9   MONOABS  --  1.0 0.4  EOSABS  --  0.0 0.0  BASOSABS  --  0.0 0.0    Recent Labs  Lab 07/14/24 0535 07/14/24 0839 07/15/24 0942 07/16/24 0540  NA 135  --  141 136  K 4.2  --  3.8 4.7  CL 102  --  107 106  CO2 23  --  24 23  ANIONGAP 10  --  10 7  GLUCOSE 111*  --  98 123*  BUN 22  --  27* 30*  CREATININE 0.56  --  0.62 0.55  AST 17  --  22 24  ALT 27  --  27 31  ALKPHOS 61  --  62 51  BILITOT 0.5  --  0.6 0.2  ALBUMIN 2.7*  --  3.2* 2.8*  CRP 4.3* 4.4* 1.7* 0.8  PROCALCITON  --  <0.10 <0.10 <0.10  MG  --   --  2.2 2.2  PHOS  --   --  1.7* 2.5  CALCIUM 8.4*  --  9.0 8.5*    Total Time in preparing paper work, data evaluation and todays exam - 35 minutes  Signature  -    Lavada Stank M.D on 07/16/2024 at 10:21 AM   -  To page go to [email or web address]

## 2024-07-17 ENCOUNTER — Telehealth: Payer: Self-pay

## 2024-07-17 LAB — KAPPA/LAMBDA LIGHT CHAINS
Kappa free light chain: 14.5 mg/L (ref 3.3–19.4)
Kappa, lambda light chain ratio: 1.84 — ABNORMAL HIGH (ref 0.26–1.65)
Lambda free light chains: 7.9 mg/L (ref 5.7–26.3)

## 2024-07-17 NOTE — Transitions of Care (Post Inpatient/ED Visit) (Unsigned)
   07/17/2024  Name: Pamela Price MRN: [id number] DOB: Dec 14, 1932  Today's TOC FU Call Status: Today's TOC FU Call Status:: Unsuccessful Call (1st Attempt) Unsuccessful Call (1st Attempt) Date: 07/17/24  Attempted to reach the patient regarding the most recent Inpatient/ED visit.  Follow Up Plan: Additional outreach attempts will be made to reach the patient to complete the Transitions of Care (Post Inpatient/ED visit) call.   Signature  Julian Lemmings, LPN [facility] Nurse Health Advisor Direct Dial [phone]

## 2024-07-18 ENCOUNTER — Ambulatory Visit (HOSPITAL_BASED_OUTPATIENT_CLINIC_OR_DEPARTMENT_OTHER): Payer: Self-pay | Admitting: Family Medicine

## 2024-07-18 ENCOUNTER — Ambulatory Visit (HOSPITAL_BASED_OUTPATIENT_CLINIC_OR_DEPARTMENT_OTHER)
Admission: RE | Admit: 2024-07-18 | Discharge: 2024-07-18 | Disposition: A | Discharge status: Home / Self Care | Source: Ambulatory Visit | Attending: Family Medicine | Admitting: Family Medicine

## 2024-07-18 ENCOUNTER — Ambulatory Visit (HOSPITAL_BASED_OUTPATIENT_CLINIC_OR_DEPARTMENT_OTHER): Admitting: Family Medicine

## 2024-07-18 VITALS — BP 122/65 | HR 68 | Temp 98.3°F | Resp 16 | Wt 108.5 lb

## 2024-07-18 DIAGNOSIS — R531 Weakness: Secondary | ICD-10-CM

## 2024-07-18 DIAGNOSIS — I771 Stricture of artery: Secondary | ICD-10-CM | POA: Diagnosis not present

## 2024-07-18 DIAGNOSIS — I517 Cardiomegaly: Secondary | ICD-10-CM | POA: Diagnosis not present

## 2024-07-18 DIAGNOSIS — I10 Essential (primary) hypertension: Secondary | ICD-10-CM | POA: Diagnosis not present

## 2024-07-18 DIAGNOSIS — M1710 Unilateral primary osteoarthritis, unspecified knee: Secondary | ICD-10-CM

## 2024-07-18 LAB — MULTIPLE MYELOMA PANEL, SERUM
Albumin SerPl Elph-Mcnc: 3.6 g/dL (ref 2.9–4.4)
Albumin/Glob SerPl: 1.1 (ref 0.7–1.7)
Alpha 1: 0.4 g/dL (ref 0.0–0.4)
Alpha2 Glob SerPl Elph-Mcnc: 1 g/dL (ref 0.4–1.0)
B-Globulin SerPl Elph-Mcnc: 1.1 g/dL (ref 0.7–1.3)
Gamma Glob SerPl Elph-Mcnc: 1 g/dL (ref 0.4–1.8)
Globulin, Total: 3.4 g/dL (ref 2.2–3.9)
IgA: 163 mg/dL (ref 64–422)
IgG (Immunoglobin G), Serum: 1164 mg/dL (ref 586–1602)
IgM (Immunoglobulin M), Srm: 132 mg/dL (ref 26–217)
Total Protein ELP: 7 g/dL (ref 6.0–8.5)

## 2024-07-18 LAB — SURGICAL PATHOLOGY

## 2024-07-18 MED ORDER — PREDNISONE 10 MG PO TABS: 30.0000 mg | ORAL_TABLET | Freq: Every day | ORAL | 0 refills | Status: AC

## 2024-07-18 NOTE — Assessment & Plan Note (Signed)
 Unclear etiology, but clearly very steroid responsive.  Await pathology review and f/u appt with Oncology in the coming weeks.  If they don't find anything concerning, she could have PMR.  Prednisone  refilled today and family understands she is not to stop it without consulting a physician, since taper will be needed.  Labs and CXR obtained today per Hospitalist orders.

## 2024-07-18 NOTE — Progress Notes (Signed)
 Established Patient Office Visit  Subjective   Patient ID: Pamela Price, female    DOB: February 06, 1933  Age: 89 y.o. MRN: [id number]  Chief Complaint  Patient presents with   Follow-up    Follow-up visit    F/u as above.  Please see recent [facility] d/c summary for details.  She had an abnormal MRI that led to a bone marrow biopsy.  Pathology pending. Upcoming appt with Oncology noted due to possible paraneoplastic syndrome.  She is dramatically improved on moderate dose Prednisone  and she is naturally delighted.  She has actually few concerns today.  She was started on new HTN meds by the hospital and we reviewed the reasoning behind this.    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   amLODipine  (NORVASC ) 10 MG tablet Take 1 tablet (10 mg total) by mouth daily.   carvedilol  (COREG ) 3.125 MG tablet Take 1 tablet (3.125 mg total) by mouth 2 (two) times daily with a meal.   denosumab  (PROLIA ) 60 MG/ML SOSY injection Inject 60 mg into the skin every 6 (six) months.   lisinopril  (ZESTRIL ) 40 MG tablet TAKE 1 TABLET BY MOUTH TWICE A DAY   predniSONE  (DELTASONE ) 10 MG tablet Take 3 tablets (30 mg total) by mouth daily with breakfast for 3 doses.   tamsulosin  (FLOMAX ) 0.4 MG CAPS capsule Take 1 capsule (0.4 mg total) by mouth daily.   UNABLE TO FIND Place 1 Dose into both eyes every 3 (three) months. Eye injections every 3  months   [DISCONTINUED] predniSONE  (DELTASONE ) 10 MG tablet Take 3 tablets (30 mg total) by mouth daily with breakfast for 7 days.   [START ON 07/31/2024] pravastatin  (PRAVACHOL ) 40 MG tablet Take 1 tablet (40 mg total) by mouth daily. (Patient not taking: Reported on 07/18/2024)   traMADol  (ULTRAM ) 50 MG tablet Take 1 tablet (50 mg total) by mouth every 12 (twelve) hours as needed for moderate pain (pain score 4-6). (Patient not taking: Reported on 07/18/2024)   No facility-administered encounter medications on file as of 07/18/2024.    Social History   Tobacco Use   Smoking status: Former    Types: Cigarettes   Smokeless tobacco: Never   Tobacco comments:    Quit 50 years ago   Vaping Use   Vaping status: Never Used  Substance Use Topics   Alcohol use: Yes    Comment: rare wine   Drug use: No      Review of Systems  Constitutional:  Negative for diaphoresis, fever, malaise/fatigue and weight loss.  Respiratory:  Negative for cough, shortness of breath and wheezing.   Cardiovascular:  Negative for chest pain, palpitations, orthopnea, claudication, leg swelling and PND.      Objective:     BP 122/65 (BP Location: Right Arm, Patient Position: Standing, Cuff Size: Normal)   Pulse 68   Temp 98.3 F (36.8 C) (  Oral)   Resp 16   Wt 108 lb 8 oz (49.2 kg)   SpO2 98%   BMI 20.50 kg/m    Physical Exam Constitutional:      General: She is not in acute distress.    Appearance: Normal appearance.     Comments: Looks great today and is actually somewhat spry.  HENT:     Head: Normocephalic.  Neck:     Vascular: No carotid bruit.  Cardiovascular:     Rate and Rhythm: Normal rate and regular rhythm.     Pulses: Normal pulses.     Heart sounds: Normal heart sounds.  Pulmonary:     Effort: Pulmonary effort is normal.     Breath sounds: Normal breath sounds.  Abdominal:     General: Bowel sounds are normal.     Palpations: Abdomen is soft.  Musculoskeletal:     Cervical back:  Neck supple. No tenderness.     Right lower leg: No edema.     Left lower leg: No edema.  Neurological:     Mental Status: She is alert.      No results found for any visits on 07/18/24.    The ASCVD Risk score (Arnett DK, et al., 2019) failed to calculate for the following reasons:   The 2019 ASCVD risk score is only valid for ages 73 to 48    Assessment & Plan:  Generalized weakness Assessment & Plan: Unclear etiology, but clearly very steroid responsive.  Await pathology review and f/u appt with Oncology in the coming weeks.  If they don't find anything concerning, she could have PMR.  Prednisone  refilled today and family understands she is not to stop it without consulting a physician, since taper will be needed.  Labs and CXR obtained today per Hospitalist orders.  Orders: -     Comprehensive metabolic panel with GFR -     CBC with Differential/Platelet -     Magnesium -     DG Chest 2 View -     predniSONE ; Take 3 tablets (30 mg total) by mouth daily with breakfast for 3 doses.  Dispense: 90 tablet; Refill: 0  Primary hypertension Assessment & Plan: Stable.  No change in meds today.  DASH diet.   Primary osteoarthritis of knee, unspecified laterality Assessment & Plan: Pain dramatically improved on the Prednisone .     No follow-ups on file.    REDDING PONCE NORLEEN FALCON., MD

## 2024-07-18 NOTE — Transitions of Care (Post Inpatient/ED Visit) (Signed)
 07/18/2024  Name: Pamela Price MRN: [id number] DOB: 20-Nov-1933  Today's TOC FU Call Status: Today's TOC FU Call Status:: Successful TOC FU Call Completed Unsuccessful Call (1st Attempt) Date: 07/17/24 [facility] FU Call Complete Date: 07/18/24 Patient's Name and Date of Birth confirmed.  Transition Care Management Follow-up Telephone Call Date of Discharge: 07/16/24 Discharge Facility: [facility]) Type of Discharge: Inpatient Admission Primary Inpatient Discharge Diagnosis:: pain How have you been since you were released from the hospital?: Better Any questions or concerns?: No  Items Reviewed: Did you receive and understand the discharge instructions provided?: Yes Medications obtained,verified, and reconciled?: Yes (Medications Reviewed) Any new allergies since your discharge?: No Dietary orders reviewed?: Yes Do you have support at home?: Yes People in Home [RPT]: child(ren), adult  Medications Reviewed Today: Medications Reviewed Today     Reviewed by Emmitt Pan, LPN (Licensed Practical Nurse) on 07/18/24 at 1216  Med List Status: <None>   Medication Order Taking? Sig Documenting Provider Last Dose Status Informant  acetaminophen  (TYLENOL ) 325 MG tablet [id number] Yes Take 2 tablets (650 mg total) by mouth every 6 (six) hours as needed for mild pain (pain score 1-3) or fever (or Fever >/= 101). Singh, Prashant K, MD  Active   amLODipine  (NORVASC ) 10 MG tablet [id number] Yes Take 1 tablet (10 mg total) by mouth daily. Singh, Prashant K, MD  Active   carvedilol  (COREG ) 3.125 MG tablet [id number] Yes Take 1 tablet (3.125 mg total) by mouth 2 (two) times daily with a meal. Dennise Lavada POUR, MD  Active   denosumab  (PROLIA ) 60 MG/ML SOSY injection [id number] Yes Inject 60 mg into the skin every 6 (six) months. CoxAbigail, MD  Active Self, Pharmacy Records, Other  lisinopril  (ZESTRIL ) 40 MG tablet [id number] Yes TAKE 1 TABLET BY MOUTH TWICE A DAY Cox, Kirsten, MD  Active  Self, Pharmacy Records, Other  pravastatin  (PRAVACHOL ) 40 MG tablet [id number]  Take 1 tablet (40 mg total) by mouth daily.  Patient not taking: Reported on 07/18/2024   Singh, Prashant K, MD  Active   predniSONE  (DELTASONE ) 10 MG tablet [id number] Yes Take 3 tablets (30 mg total) by mouth daily with breakfast for 7 days. Singh, Prashant K, MD  Active   tamsulosin  (FLOMAX ) 0.4 MG CAPS capsule [id number] Yes Take 1 capsule (0.4 mg total) by mouth daily. Singh, Prashant K, MD  Active   traMADol  (ULTRAM ) 50 MG tablet [id number] Yes Take 1 tablet (50 mg total) by mouth every 12 (twelve) hours as needed for moderate pain (pain score 4-6). Dennise Lavada POUR, MD  Active   UNABLE TO FIND [id number] Yes Place 1 Dose into both eyes every 3 (three) months. Eye injections every 3 months [provider]  Active Self, Pharmacy Records, Other            Home Care and Equipment/Supplies: Were Home Health Services Ordered?: Yes Name of Home Health Agency:: unknown Has Agency set up a time to come to your home?: Yes First Home Health Visit Date: 07/19/24 Any new equipment or medical supplies ordered?: NA  Functional Questionnaire: Do you need assistance with bathing/showering or dressing?: No Do you need assistance with meal preparation?: No Do you need assistance with eating?: No Do you have difficulty maintaining continence: No Do you need assistance with getting out of bed/getting out of a chair/moving?: No Do you have difficulty managing or taking your medications?: No  Follow up appointments reviewed: PCP Follow-up appointment confirmed?: Yes Date of PCP follow-up appointment?:  07/18/24 Follow-up Provider: [facility] Follow-up appointment confirmed?: NA Do you need transportation to your follow-up appointment?: No Do you understand care options if your condition(s) worsen?: Yes-patient verbalized understanding    SIGNATURE Julian Lemmings, LPN [facility] Nurse Health  Advisor Direct Dial [phone]

## 2024-07-18 NOTE — Assessment & Plan Note (Signed)
 Pain dramatically improved on the Prednisone .

## 2024-07-18 NOTE — Assessment & Plan Note (Signed)
 Stable.  No change in meds today.  DASH diet.

## 2024-07-19 LAB — CBC WITH DIFFERENTIAL/PLATELET
Basophils Absolute: 0 10*3/uL (ref 0.0–0.2)
Basos: 0 %
EOS (ABSOLUTE): 0 10*3/uL (ref 0.0–0.4)
Eos: 0 %
Hematocrit: 39.2 % (ref 34.0–46.6)
Hemoglobin: 12.5 g/dL (ref 11.1–15.9)
Immature Grans (Abs): 0.6 10*3/uL — ABNORMAL HIGH (ref 0.0–0.1)
Immature Granulocytes: 4 %
Lymphocytes Absolute: 0.8 10*3/uL (ref 0.7–3.1)
Lymphs: 6 %
MCH: 28.5 pg (ref 26.6–33.0)
MCHC: 31.9 g/dL (ref 31.5–35.7)
MCV: 90 fL (ref 79–97)
Monocytes Absolute: 0.3 10*3/uL (ref 0.1–0.9)
Monocytes: 2 %
Neutrophils Absolute: 12.7 10*3/uL — ABNORMAL HIGH (ref 1.4–7.0)
Neutrophils: 88 %
Platelets: 534 10*3/uL — ABNORMAL HIGH (ref 150–450)
RBC: 4.38 x10E6/uL (ref 3.77–5.28)
RDW: 12.4 % (ref 11.7–15.4)
WBC: 14.4 10*3/uL — ABNORMAL HIGH (ref 3.4–10.8)

## 2024-07-19 LAB — COMPREHENSIVE METABOLIC PANEL WITH GFR
ALT: 82 [IU]/L — ABNORMAL HIGH (ref 0–32)
AST: 31 [IU]/L (ref 0–40)
Albumin: 4.1 g/dL (ref 3.7–4.7)
Alkaline Phosphatase: 81 [IU]/L (ref 44–121)
BUN/Creatinine Ratio: 47 — ABNORMAL HIGH (ref 12–28)
BUN: 29 mg/dL — ABNORMAL HIGH (ref 8–27)
Bilirubin Total: 0.3 mg/dL (ref 0.0–1.2)
CO2: 21 mmol/L (ref 20–29)
Calcium: 9.8 mg/dL (ref 8.7–10.3)
Chloride: 99 mmol/L (ref 96–106)
Creatinine, Ser: 0.62 mg/dL (ref 0.57–1.00)
Globulin, Total: 2.9 g/dL (ref 1.5–4.5)
Glucose: 167 mg/dL — ABNORMAL HIGH (ref 70–99)
Potassium: 4.7 mmol/L (ref 3.5–5.2)
Sodium: 137 mmol/L (ref 134–144)
Total Protein: 7 g/dL (ref 6.0–8.5)
eGFR: 86 mL/min/{1.73_m2}

## 2024-07-19 LAB — MAGNESIUM: Magnesium: 2.2 mg/dL (ref 1.6–2.3)

## 2024-07-23 ENCOUNTER — Telehealth: Payer: Self-pay

## 2024-07-23 ENCOUNTER — Inpatient Hospital Stay: Attending: Hematology | Admitting: Hematology

## 2024-07-23 ENCOUNTER — Encounter: Payer: Self-pay | Admitting: Hematology

## 2024-07-23 DIAGNOSIS — D75839 Thrombocytosis, unspecified: Secondary | ICD-10-CM | POA: Diagnosis not present

## 2024-07-23 DIAGNOSIS — R937 Abnormal findings on diagnostic imaging of other parts of musculoskeletal system: Secondary | ICD-10-CM | POA: Insufficient documentation

## 2024-07-23 NOTE — Progress Notes (Signed)
 [facility]   Telephone:([phone] Fax:(336) [phone]   Clinic Follow up Note   Patient Care Team: Dottie Norleen [name], MD as PCP - General (Family Medicine) Nyle Rankin POUR, [facility] (Inactive) as Pharmacist (Pharmacist) 07/23/2024  I connected with Pamela Price on 07/23/24 at  8:15 AM EDT by telephone and verified that I am speaking with the correct person using two identifiers.   I discussed the limitations, risks, security and privacy concerns of performing an evaluation and management service by telephone and the availability of in person appointments. I also discussed with the patient that there may be a patient responsible charge related to this service. The patient expressed understanding and agreed to proceed.   Patient's location: Home Provider's location:  Office    CHIEF COMPLAINT: Follow-up of bone marrow biopsy results   Assessment & Plan Abnormal bone marrow signal on MRI, etiology undetermined Abnormal bone marrow signal identified on MRI at [facility]. Bone marrow biopsy showed variable cellular bone marrow with trilineage hematopoiesis with mild megakaryocyte dyspoiesis.  The morphology was nonspecific, although MPN is not ruled out.  No evidence of blasts or other malignancy.  Cytogenetics is still pending. Steroid use prior to biopsy may have altered cell appearance.  -She did have mild thrombocytosis, if that persists, I think is reasonable to have MPN genetic panel  - She is scheduled to meet Dr. Ezzard, hematologist and oncologist, for further evaluation on August 25th. - Repeat blood counts, including platelet count, during the visit with Dr. Ezzard. -I will see her as needed.    Discussed the use of AI scribe software for clinical note transcription with the patient, who gave verbal consent to proceed.  History of Present Illness Pamela Price is an 89 year old female who presents for a phone visit to discuss pulmonary biopsy  results.  She is on prednisone , 30 mg daily, and is concerned about worsening conditions when tapering off steroids due to past mobility issues. An [facility] scan at [facility] showed abnormal bone marrow signals, leading to a bone marrow biopsy. Results have not indicated leukemia or lymphoma, with some tests still pending. Prior steroid use may have affected cell appearance. Her platelet count was slightly elevated during her hospital stay. Further blood tests are planned to monitor her platelet count and assess for any blood disorders.     REVIEW OF SYSTEMS:   Constitutional: Denies fevers, chills or abnormal weight loss Eyes: Denies blurriness of vision Ears, nose, mouth, throat, and face: Denies mucositis or sore throat Respiratory: Denies cough, dyspnea or wheezes Cardiovascular: Denies palpitation, chest discomfort or lower extremity swelling Gastrointestinal:  Denies nausea, heartburn or change in bowel habits Skin: Denies abnormal skin rashes Lymphatics: Denies new lymphadenopathy or easy bruising Neurological:Denies numbness, tingling or new weaknesses Behavioral/Psych: Mood is stable, no new changes  All other systems were reviewed with the patient and are negative.  MEDICAL HISTORY:  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/30/2021   right leg only    SURGICAL HISTORY: Past Surgical History:  Procedure Laterality Date   BREAST LUMPECTOMY W/ NEEDLE LOCALIZATION     axillary tail of the left  breast    COLON SURGERY  02/2019   for stage I colon cancer   colonscopy  2020   IR BONE MARROW BIOPSY & ASPIRATION  07/16/2024   ROBOTIC ASSISTED BILATERAL SALPINGO  OOPHERECTOMY N/A 07/14/2021   ROBOTIC ASSISTED TOTAL HYSTERECTOMY WITH BILATERAL SALPINGO OOPHERECTOMY  07/14/2021   TOTAL HIP ARTHROPLASTY Right 08/30/2017   Procedure: RIGHT TOTAL HIP ARTHROPLASTY ANTERIOR APPROACH;  Surgeon: Melodi Lerner, MD;  Location: [facility];  Service: Orthopedics;  Laterality: Right;    I have reviewed the social history and family history with the patient and they are unchanged from previous note.  ALLERGIES:  is allergic to sulfa antibiotics.  MEDICATIONS:  Current Outpatient Medications  Medication Sig Dispense Refill   acetaminophen  (TYLENOL ) 325 MG tablet Take 2 tablets (650 mg total) by mouth every 6 (six) hours as needed for mild pain (pain score 1-3) or fever (or Fever >/= 101). 20 tablet 0   amLODipine  (NORVASC ) 10 MG tablet Take 1 tablet (10 mg total) by mouth daily. 30 tablet 0   carvedilol  (COREG ) 3.125 MG tablet Take 1 tablet (3.125 mg total) by mouth 2 (two) times daily with a meal. 60 tablet 0   denosumab  (PROLIA ) 60 MG/ML SOSY injection Inject 60 mg into the skin every 6 (six) months. 1 mL 1   lisinopril  (ZESTRIL ) 40 MG tablet TAKE 1 TABLET BY MOUTH TWICE A DAY 180 tablet 1   [START ON 07/31/2024] pravastatin  (PRAVACHOL ) 40 MG tablet Take 1 tablet (40 mg total) by mouth daily. (Patient not taking: Reported on 07/18/2024)     tamsulosin  (FLOMAX ) 0.4 MG CAPS capsule Take 1 capsule (0.4 mg total) by mouth daily. 30 capsule 0   traMADol  (ULTRAM ) 50 MG tablet Take 1 tablet (50 mg total) by mouth every 12 (twelve) hours as needed for moderate pain (pain score 4-6). (Patient not taking: Reported on 07/18/2024) 10 tablet 0   UNABLE TO FIND Place 1 Dose into both eyes every 3 (three) months. Eye injections every 3 months     No current facility-administered medications for this visit.    PHYSICAL EXAMINATION: Not performed   LABORATORY DATA:  I have reviewed the data as listed    Latest Ref Rng & Units 07/18/2024    2:06 PM 07/16/2024    5:40 AM 07/15/2024     9:42 AM  CBC  WBC 3.4 - 10.8 x10E3/uL 14.4  9.8  14.1   Hemoglobin 11.1 - 15.9 g/dL 87.4  89.1  87.9   Hematocrit 34.0 - 46.6 % 39.2  33.2  37.0   Platelets 150 - 450 x10E3/uL 534  405  493         Latest Ref Rng & Units 07/18/2024    2:06 PM 07/16/2024    5:40 AM 07/15/2024    9:42 AM  CMP  Glucose 70 - 99 mg/dL 832  876  98   BUN 8 - 27 mg/dL 29  30  27    Creatinine 0.57 - 1.00 mg/dL 9.37  9.44  9.37   Sodium 134 - 144 mmol/L 137  136  141   Potassium 3.5 - 5.2 mmol/L 4.7  4.7  3.8   Chloride 96 - 106 mmol/L 99  106  107   CO2 20 - 29 mmol/L 21  23  24    Calcium 8.7 - 10.3 mg/dL 9.8  8.5  9.0   Total Protein 6.0 - 8.5 g/dL 7.0  5.9  7.0   Total Bilirubin 0.0 - 1.2  mg/dL 0.3  0.2  0.6   Alkaline Phos 44 - 121 IU/L 81  51  62   AST 0 - 40 IU/L 31  24  22    ALT 0 - 32 IU/L 82  31  27       RADIOGRAPHIC STUDIES: I have personally reviewed the radiological images as listed and agreed with the findings in the report. No results found.     I discussed the assessment and treatment plan with the patient. The patient was provided an opportunity to ask questions and all were answered. The patient agreed with the plan and demonstrated an understanding of the instructions.   The patient was advised to call back or seek an in-person evaluation if the symptoms worsen or if the condition fails to improve as anticipated.  I provided 15 minutes of non face-to-face telephone visit time during this encounter, including review of chart and various tests results, discussions about plan of care and coordination of care plan.    Onita Mattock, MD 07/23/24

## 2024-07-23 NOTE — Telephone Encounter (Signed)
 Copied from CRM [id number]. Topic: Clinical - Medication Question >> Jul 23, 2024 11:40 AM Wess RAMAN wrote: Reason for CRM: Patient does not understand the directions on how to take Prednisone .  Callback #: [id number]

## 2024-07-24 ENCOUNTER — Encounter (HOSPITAL_COMMUNITY): Payer: Self-pay | Admitting: Hematology

## 2024-07-24 ENCOUNTER — Encounter (HOSPITAL_BASED_OUTPATIENT_CLINIC_OR_DEPARTMENT_OTHER): Payer: Self-pay | Admitting: Family Medicine

## 2024-07-26 ENCOUNTER — Encounter (HOSPITAL_BASED_OUTPATIENT_CLINIC_OR_DEPARTMENT_OTHER): Payer: Self-pay | Admitting: Family Medicine

## 2024-07-29 ENCOUNTER — Encounter (HOSPITAL_BASED_OUTPATIENT_CLINIC_OR_DEPARTMENT_OTHER): Payer: Self-pay | Admitting: Family Medicine

## 2024-07-29 ENCOUNTER — Ambulatory Visit: Payer: Self-pay

## 2024-07-29 ENCOUNTER — Other Ambulatory Visit: Payer: Self-pay | Admitting: Family Medicine

## 2024-07-29 DIAGNOSIS — E782 Mixed hyperlipidemia: Secondary | ICD-10-CM

## 2024-07-29 NOTE — Telephone Encounter (Signed)
 FYI Only or Action Required?: Action required by provider: referral request. Referral to [facility] rheumatology  Patient was last seen in primary care on 07/18/2024 by Dottie Norleen [name], MD.  Called Nurse Triage reporting Referral.  Symptoms began at age 89.  Interventions attempted: Other: Patient says that she has requested a reqferral multiple time with no acknowledgment.  Symptoms are: stable.  Triage Disposition: Call PCP Within 24 Hours  Patient/caregiver understands and will follow disposition?: Yes- Patient requesting notification when the referral is sent. Phone number confirmed  Copied from CRM #[id number]. Topic: Clinical - Red Word Triage >> Jul 29, 2024 10:23 AM Pamela Price wrote: Red Word that prompted transfer to Nurse Triage:  Patient is in a lot of pain as of right now and doesn't know what she needs to do regarding this , has called about a referral to a rheumatologist, stated she has been asking for this for a week now. Reason for Disposition  [1] Caller requests to speak ONLY to PCP AND [2] NON-URGENT question  Answer Assessment - Initial Assessment Questions 1. REASON FOR CALL or QUESTION: What is your reason for calling today? or How can I best     Patient says she has been asking for several days for a referral to rheumatology and one has not been sent yet. Pt frustrated that she has not received any acknowledgement of her request  2. CALLER: Document the source of call. (e.g., laboratory staff, caregiver or patient).     Patient  Protocols used: PCP Call - No Triage-A-AH

## 2024-07-30 ENCOUNTER — Other Ambulatory Visit (HOSPITAL_BASED_OUTPATIENT_CLINIC_OR_DEPARTMENT_OTHER): Payer: Self-pay | Admitting: Family Medicine

## 2024-07-30 ENCOUNTER — Telehealth (HOSPITAL_BASED_OUTPATIENT_CLINIC_OR_DEPARTMENT_OTHER): Payer: Self-pay | Admitting: *Deleted

## 2024-07-30 DIAGNOSIS — M353 Polymyalgia rheumatica: Secondary | ICD-10-CM

## 2024-07-30 NOTE — Telephone Encounter (Signed)
 Pt. Made ware of message and in good understanding.

## 2024-08-01 ENCOUNTER — Telehealth (HOSPITAL_BASED_OUTPATIENT_CLINIC_OR_DEPARTMENT_OTHER): Payer: Self-pay | Admitting: Family Medicine

## 2024-08-01 NOTE — Telephone Encounter (Signed)
 Copied from CRM #[id number]. Topic: Clinical - Medication Question >> Jul 31, 2024  2:17 PM Santiya F wrote: Reason for CRM: Patient is calling in because she has some questions about her medications. Patient says she needs to know what medications she is supposed to be taking because she isn't sure. Patient says she's sent a message on MyChart but has gotten no response.

## 2024-08-05 ENCOUNTER — Encounter: Payer: Self-pay | Admitting: Oncology

## 2024-08-05 ENCOUNTER — Inpatient Hospital Stay: Admitting: Oncology

## 2024-08-05 ENCOUNTER — Other Ambulatory Visit: Payer: Self-pay | Admitting: Oncology

## 2024-08-05 ENCOUNTER — Inpatient Hospital Stay

## 2024-08-05 VITALS — BP 176/72 | HR 67 | Temp 98.2°F | Resp 14 | Ht 61.42 in | Wt 106.1 lb

## 2024-08-05 DIAGNOSIS — R937 Abnormal findings on diagnostic imaging of other parts of musculoskeletal system: Secondary | ICD-10-CM | POA: Diagnosis not present

## 2024-08-05 DIAGNOSIS — D75839 Thrombocytosis, unspecified: Secondary | ICD-10-CM | POA: Diagnosis present

## 2024-08-05 LAB — CBC WITH DIFFERENTIAL (CANCER CENTER ONLY)
Abs Immature Granulocytes: 0.13 10*3/uL — ABNORMAL HIGH (ref 0.00–0.07)
Basophils Absolute: 0 10*3/uL (ref 0.0–0.1)
Basophils Relative: 0 %
Eosinophils Absolute: 0 10*3/uL (ref 0.0–0.5)
Eosinophils Relative: 0 %
HCT: 37.7 % (ref 36.0–46.0)
Hemoglobin: 12.1 g/dL (ref 12.0–15.0)
Immature Granulocytes: 1 %
Lymphocytes Relative: 7 %
Lymphs Abs: 0.7 10*3/uL (ref 0.7–4.0)
MCH: 28.7 pg (ref 26.0–34.0)
MCHC: 32.1 g/dL (ref 30.0–36.0)
MCV: 89.5 fL (ref 80.0–100.0)
Monocytes Absolute: 0.2 10*3/uL (ref 0.1–1.0)
Monocytes Relative: 2 %
Neutro Abs: 9.8 10*3/uL — ABNORMAL HIGH (ref 1.7–7.7)
Neutrophils Relative %: 90 %
Platelet Count: 241 10*3/uL (ref 150–400)
RBC: 4.21 MIL/uL (ref 3.87–5.11)
RDW: 14.7 % (ref 11.5–15.5)
WBC Count: 10.9 10*3/uL — ABNORMAL HIGH (ref 4.0–10.5)
nRBC: 0 % (ref 0.0–0.2)

## 2024-08-08 DIAGNOSIS — H353231 Exudative age-related macular degeneration, bilateral, with active choroidal neovascularization: Secondary | ICD-10-CM | POA: Diagnosis not present

## 2024-08-20 ENCOUNTER — Ambulatory Visit

## 2024-08-22 ENCOUNTER — Ambulatory Visit (HOSPITAL_BASED_OUTPATIENT_CLINIC_OR_DEPARTMENT_OTHER): Admitting: Family Medicine

## 2024-08-26 ENCOUNTER — Telehealth (HOSPITAL_BASED_OUTPATIENT_CLINIC_OR_DEPARTMENT_OTHER): Payer: Self-pay | Admitting: *Deleted

## 2024-08-26 NOTE — Progress Notes (Signed)
 Error

## 2024-08-28 NOTE — Telephone Encounter (Signed)
Pt. Was called

## 2024-09-02 ENCOUNTER — Ambulatory Visit

## 2024-09-02 VITALS — BP 131/66 | HR 73 | Temp 97.6°F | Resp 12 | Ht 61.0 in | Wt 106.6 lb

## 2024-09-02 DIAGNOSIS — M353 Polymyalgia rheumatica: Secondary | ICD-10-CM

## 2024-09-02 DIAGNOSIS — Z1159 Encounter for screening for other viral diseases: Secondary | ICD-10-CM

## 2024-09-02 DIAGNOSIS — Z79899 Other long term (current) drug therapy: Secondary | ICD-10-CM

## 2024-09-02 MED ORDER — PANTOPRAZOLE SODIUM 20 MG PO TBEC: 20.0000 mg | DELAYED_RELEASE_TABLET | Freq: Every day | ORAL | 0 refills | Status: DC

## 2024-09-02 MED ORDER — PREDNISONE 5 MG PO TABS: ORAL_TABLET | ORAL | 0 refills | Status: AC

## 2024-09-02 NOTE — Progress Notes (Signed)
 Office Visit Note  Patient: Pamela Price             Date of Birth: Jan 21, 1933           MRN: [id number]             PCP: Sirivol, Mamatha, MD Referring: Sirivol, Mamatha, MD Visit Date: 09/02/2024 Occupation: Data Unavailable  Subjective:  New Patient (Initial Visit) (Patient states she has joint pain when not on prednisone . Patient states when she is off prednisone  she can not get out off the bed. )   History of Present Illness: Pamela Price is a 89 y.o. female to establish care for PMR.   Patient states that she was in her normal state of health until this past July. She states it started acutely in July, where she suddenly could no longer get out of bed. She states pain started in her neck and shoulders (worse on the right) and then spread to her bilateral hips all the way down her bilateral legs (again worse on the right). She states that the pain and stiffness is worse in the morning and it eases up towards the afternoon. She states that she went to orthopedics who gave her a prednisone  taper, which completely resolved her pain at the beginning, but it came back as she went down on the prednisone .  She also states that her symptoms were initially attributed to cancer due to abnormal findings on an MRI, but her bone marrow biopsy was negative.  She has currently been on prednisone  20mg  daily for the past 2 weeks, she denies any side effects. She states she only has two pills left.   She denies any temporal headaches, loss of vision, jaw claudication symptoms. She denies any fever.    Activities of Daily Living:  Patient reports morning stiffness for 0 minutes.   Patient Denies nocturnal pain.  Difficulty dressing/grooming: Denies Difficulty climbing stairs: Denies Difficulty getting out of chair: Reports Difficulty using hands for taps, buttons, cutlery, and/or writing: Denies  Review of Systems  Constitutional:  Negative for fatigue.  HENT:  Negative for  mouth sores and mouth dryness.   Eyes:  Positive for dryness.  Respiratory:  Negative for shortness of breath.   Cardiovascular:  Negative for chest pain and palpitations.  Gastrointestinal:  Negative for blood in stool, constipation and diarrhea.  Endocrine: Positive for increased urination.  Genitourinary:  Negative for involuntary urination.  Musculoskeletal:  Negative for joint pain, gait problem, joint pain, joint swelling, myalgias, muscle weakness, morning stiffness, muscle tenderness and myalgias.  Skin:  Positive for hair loss. Negative for color change, rash and sensitivity to sunlight.  Allergic/Immunologic: Negative for susceptible to infections.  Neurological:  Negative for dizziness and headaches.  Hematological:  Negative for swollen glands.  Psychiatric/Behavioral:  Positive for sleep disturbance. Negative for depressed mood. The patient is nervous/anxious.     PMFS History:  Patient Active Problem List   Diagnosis Date Noted   Thrombocytosis 07/23/2024   Generalized weakness 07/18/2024   Acute generalized body pain 07/14/2024   Abnormal MRI, spine 07/13/2024   Cervical nerve root impingement 07/10/2024   Encounter for Medicare annual wellness exam 09/16/2023   Hearing loss 09/09/2022   Macular degeneration of both eyes 09/28/2021   Age-related osteoporosis without current pathological fracture 09/28/2021   Primary hypertension 09/03/2021   Mixed hyperlipidemia 09/03/2021   Osteoarthritis 08/30/2017    Past Medical History:  Diagnosis Date   Adnexal mass 06/30/2021   History of  right hemicolectomy    HLD (hyperlipidemia) 06/30/2021   Hypertension 06/30/2021   Left ovarian cyst 07/22/2019   Macular degeneration 06/30/2021   bilateral  gets eye injections   Osteoarthritis    Osteoporosis 06/30/2021   Polio    Varicose veins of both lower extremities 06/30/2021   right leg only    Family History  Problem Relation Age of Onset   Liver cancer Mother     Lymphoma Father    Atrial fibrillation Son    Schizophrenia Son    Past Surgical History:  Procedure Laterality Date   BREAST LUMPECTOMY W/ NEEDLE LOCALIZATION     axillary tail of the left  breast    CATARACT EXTRACTION Bilateral    CESAREAN SECTION     X 2   COLON SURGERY  02/2019   for stage I colon cancer   colonscopy  2020   IR BONE MARROW BIOPSY & ASPIRATION  07/16/2024   ROBOTIC ASSISTED BILATERAL SALPINGO OOPHERECTOMY N/A 07/14/2021   ROBOTIC ASSISTED TOTAL HYSTERECTOMY WITH BILATERAL SALPINGO OOPHERECTOMY  07/14/2021   TOTAL HIP ARTHROPLASTY Right 08/30/2017   Procedure: RIGHT TOTAL HIP ARTHROPLASTY ANTERIOR APPROACH;  Surgeon: Melodi Lerner, MD;  Location: [facility];  Service: Orthopedics;  Laterality: Right;   TUBAL LIGATION Bilateral    Social History   Tobacco Use   Smoking status: Former    Types: Cigarettes   Smokeless tobacco: Never   Tobacco comments:    Quit 50 years ago   Vaping Use   Vaping status: Never Used  Substance Use Topics   Alcohol use: Yes    Comment: rare wine   Drug use: No   Social History   Social History Narrative   Not on file     Immunization History  Administered Date(s) Administered   Fluad Quad(high Dose 65+) 10/05/2020, 09/03/2021, 09/09/2022   Fluad Trivalent(High Dose 65+) 09/12/2023   Moderna Covid-19 Vaccine Bivalent Booster 40yrs & up 09/28/2021   Moderna SARS-COV2 Booster Vaccination 11/10/2020   Moderna Sars-Covid-2 Vaccination 01/23/2020, 02/17/2020, 05/19/2021   Pneumococcal Conjugate-13 03/30/2016   Pneumococcal Polysaccharide-23 09/18/2012     Objective: Vital Signs: BP 131/66 (BP Location: Right Arm, Patient Position: Sitting, Cuff Size: Normal)   Pulse 73   Temp 97.6 F (36.4 C)   Resp 12   Ht 5' 1 (1.549 m)   Wt 106 lb 9.6 oz (48.4 kg)   BMI 20.14 kg/m    Physical Exam   Musculoskeletal Exam:   CDAI Exam: CDAI Score: -- Patient Global: --; Provider Global: -- Swollen: --; Tender: -- Joint  Exam 09/02/2024   No joint exam has been documented for this visit   There is currently no information documented on the homunculus. Go to the Rheumatology activity and complete the homunculus joint exam.  Investigation: No additional findings.  Imaging: No results found.  Recent Labs: Lab Results  Component Value Date   WBC 10.9 (H) 08/05/2024   HGB 12.1 08/05/2024   PLT 241 08/05/2024   NA 137 07/18/2024   K 4.7 07/18/2024   CL 99 07/18/2024   CO2 21 07/18/2024   GLUCOSE 167 (H) 07/18/2024   BUN 29 (H) 07/18/2024   CREATININE 0.62 07/18/2024   BILITOT 0.3 07/18/2024   ALKPHOS 81 07/18/2024   AST 31 07/18/2024   ALT 82 (H) 07/18/2024   PROT 7.0 07/18/2024   ALBUMIN 4.1 07/18/2024   CALCIUM 9.8 07/18/2024   GFRAA 96 10/05/2020    Speciality Comments: No specialty comments available.  Procedures:  No procedures performed Allergies: Sulfa antibiotics   Assessment / Plan:     Visit Diagnoses:  PMR (polymyalgia rheumatica) (HCC) Patient with symptoms and elevated inflammatory markers, consistent with diagnosis of polymyalgia rheumatica. As discussed with patient, most recent ACR 2015 guidelines recommend prednisone  dose starting between 12.5-25mg  daily with slow taper to 10mg  daily within 4-8 weeks, then taper by 1mg  q 4 weeks until discontinuation given that remission is maintained.  Since patient has been on 20mg  for ~14 days, will decrease to 17.5mg  for 14 days then plan to decrease to 15mg , which she is instructed to continue until her next follow-up in 1 month. Further taper recommendations pending clinical response.  Discussed steroid side effects including weight gain, decreased bone density/fractures, risk for infection, avascular necrosis, hypertension, hyperglycemia, dyspepsia/gastric ulcers, fluid retention, weakness, bruising, acne, cataracts, insomnia, mood problems. Patient also instructed to take this medication with food, and not to take this medication  with NSAIDs such as Aleve or Ibuprofen .  She was also instructed to STOP her current prednisone  prescription and not to combine it with her new prescription. Patient also given pantoprazole  prescription today for stomach protection while on high dose steroids.   Patient also advised that if he develops symptoms concerning for GCA (severe temporal headache, sudden vision loss, jaw claudication) to let us  know and report to the ED immediately for evaluation.    Given patient's age, I am concerned about keeping patient on long term prednisone . Patient admits to concerns giving herself injections, but may be willing to get her neighbor to assist. She denies hx of diverticulitis. She was given handout on Kevzara at this encounter for consideration. Will obtain hepatitis profile and quantiferon gold today.  Will obtain ESR and CRP today, as well as repeat at next visit.  High risk medication use On prednisone  for PMR. Baseline toxicity monitoring labs obtained 07/2024, will repeat in 2 months.  Orders: Orders Placed This Encounter  Procedures   Hepatitis B core antibody, IgM   Hepatitis B surface antigen   Hepatitis C antibody   Sedimentation rate   C-reactive protein   QuantiFERON-TB Gold Plus   Meds ordered this encounter  Medications   predniSONE  (DELTASONE ) 5 MG tablet    Sig: Take 3.5 tablets (17.5 mg total) by mouth daily with breakfast for 14 days, THEN 3 tablets (15 mg total) daily with breakfast for 21 days. Take in the morning with breakfast. Do not take with NSAIDS.    Dispense:  90 tablet    Refill:  0    I personally spent a total of 60 minutes in the care of the patient today including preparing to see the patient, getting/reviewing separately obtained history, performing a medically appropriate exam/evaluation, counseling and educating, placing orders, and documenting clinical information in the EHR.   Follow-Up Instructions: Return in about 4 weeks (around  09/30/2024).   Asberry Claw, DO

## 2024-09-02 NOTE — Patient Instructions (Signed)
 Please 17.5mg  x 14 days, then decrease to 15mg  and continue until follow-up with me in 4 weeks.

## 2024-09-03 NOTE — Telephone Encounter (Signed)
 Reached out to patient. She states she is having pain right above her right eye. Patient states she does not have a history of many headaches. Patient denies any vision loss or blurred vision. Patient states her hands are very stiff. Patient advised per Dr. Luba, to go to urgent care or ED if her symptoms worsen or she looses vision. Dr. Luba is not sure what caused her finger stiffness, but hopefully that will improve soon, but very likely unrelated. Patient expressed understanding.

## 2024-09-04 LAB — C-REACTIVE PROTEIN: CRP: <3 mg/L

## 2024-09-04 LAB — HEPATITIS B CORE ANTIBODY, IGM: Hep B C IgM: NONREACTIVE

## 2024-09-04 LAB — QUANTIFERON-TB GOLD PLUS
Mitogen-NIL: 5.51 [IU]/mL
NIL: 0.31 [IU]/mL
QuantiFERON-TB Gold Plus: NEGATIVE
TB1-NIL: <0 [IU]/mL
TB2-NIL: 0.03 [IU]/mL

## 2024-09-04 LAB — HEPATITIS B SURFACE ANTIGEN: Hepatitis B Surface Ag: NONREACTIVE

## 2024-09-04 LAB — SEDIMENTATION RATE: Sed Rate: 6 mm/h (ref 0–30)

## 2024-09-04 LAB — HEPATITIS C ANTIBODY: Hepatitis C Ab: NONREACTIVE

## 2024-09-06 ENCOUNTER — Ambulatory Visit: Payer: Self-pay

## 2024-09-16 ENCOUNTER — Encounter: Payer: Medicare HMO | Admitting: Family Medicine

## 2024-09-19 ENCOUNTER — Ambulatory Visit

## 2024-09-24 NOTE — Progress Notes (Unsigned)
 Office Visit Note  Patient: Pamela Price             Date of Birth: October 07, 1933           MRN: [id number]             PCP: No primary care provider on file. Referring: Sirivol, Mamatha, MD Visit Date: 10/07/2024 Occupation: Data Unavailable  Subjective:  Other (Patient states she just wants to know what she has. )  Discussed the use of AI scribe software for clinical note transcription with the patient, who gave verbal consent to proceed.  History of Present Illness Pamela Price is an 89 year old female with polymyalgia rheumatica who presents for follow-up on prednisone  tapering.  She is currently on 15 mg of prednisone  daily, having tapered from 20 mg to 17.5 mg, and now to 15 mg. She found a bottle of 20 mg prednisone  from a previous prescription and has been cutting them in half to manage her dosage. She is concerned about running out of medication.  Her initial symptoms of pain and stiffness were typically worse in the mornings but improved by noon. The pain was severe enough to prevent her from getting out of bed at times. She feels better with prednisone  and is concerned about tapering off too quickly.  No current morning stiffness or pain. No stomach upset or infections, although she had a recent episode of coughing, which she attributes to a flu-like illness that she and her son experienced. She has received a flu shot and a COVID booster recently.  Occasional discomfort over her eyebrow is noted, but no severe headaches or vision changes. She is a vegetarian and mentions concerns about weight loss, noting that she needs to gain weight.     Activities of Daily Living:  Patient reports morning stiffness for 0 minutes.   Patient Denies nocturnal pain.  Difficulty dressing/grooming: Denies Difficulty climbing stairs: Denies Difficulty getting out of chair: Denies Difficulty using hands for taps, buttons, cutlery, and/or writing: Denies  Review of Systems   Constitutional:  Negative for fatigue.  HENT:  Negative for mouth sores and mouth dryness.   Eyes:  Negative for dryness.  Respiratory:  Negative for shortness of breath.   Cardiovascular:  Negative for chest pain and palpitations.  Gastrointestinal:  Positive for constipation. Negative for blood in stool and diarrhea.  Endocrine: Positive for increased urination.  Genitourinary:  Positive for involuntary urination.  Musculoskeletal:  Positive for joint pain, joint pain and muscle weakness. Negative for gait problem, joint swelling, myalgias, morning stiffness, muscle tenderness and myalgias.  Skin:  Positive for hair loss. Negative for color change, rash and sensitivity to sunlight.  Allergic/Immunologic: Negative for susceptible to infections.  Neurological:  Positive for headaches. Negative for dizziness.  Hematological:  Negative for swollen glands.  Psychiatric/Behavioral:  Positive for sleep disturbance. Negative for depressed mood. The patient is nervous/anxious.     PMFS History:  Patient Active Problem List   Diagnosis Date Noted   Thrombocytosis 07/23/2024   Generalized weakness 07/18/2024   Acute generalized body pain 07/14/2024   Abnormal MRI, spine 07/13/2024   Cervical nerve root impingement 07/10/2024   Encounter for Medicare annual wellness exam 09/16/2023   Hearing loss 09/09/2022   Macular degeneration of both eyes 09/28/2021   Age-related osteoporosis without current pathological fracture 09/28/2021   Primary hypertension 09/03/2021   Mixed hyperlipidemia 09/03/2021   Osteoarthritis 08/30/2017    Past Medical History:  Diagnosis Date  Adnexal mass 06/30/2021   History of right hemicolectomy    HLD (hyperlipidemia) 06/30/2021   Hypertension 06/30/2021   Left ovarian cyst 07/22/2019   Macular degeneration 06/30/2021   bilateral  gets eye injections   Osteoarthritis    Osteoporosis 06/30/2021   Polio    Varicose veins of both lower extremities 06/30/2021    right leg only    Family History  Problem Relation Age of Onset   Liver cancer Mother    Lymphoma Father    Atrial fibrillation Son    Schizophrenia Son    Past Surgical History:  Procedure Laterality Date   BREAST LUMPECTOMY W/ NEEDLE LOCALIZATION     axillary tail of the left  breast    CATARACT EXTRACTION Bilateral    CESAREAN SECTION     X 2   COLON SURGERY  02/2019   for stage I colon cancer   colonscopy  2020   IR BONE MARROW BIOPSY & ASPIRATION  07/16/2024   ROBOTIC ASSISTED BILATERAL SALPINGO OOPHERECTOMY N/A 07/14/2021   ROBOTIC ASSISTED TOTAL HYSTERECTOMY WITH BILATERAL SALPINGO OOPHERECTOMY  07/14/2021   TOTAL HIP ARTHROPLASTY Right 08/30/2017   Procedure: RIGHT TOTAL HIP ARTHROPLASTY ANTERIOR APPROACH;  Surgeon: Melodi Lerner, MD;  Location: [facility];  Service: Orthopedics;  Laterality: Right;   TUBAL LIGATION Bilateral    Social History   Tobacco Use   Smoking status: Former    Types: Cigarettes   Smokeless tobacco: Never   Tobacco comments:    Quit 50 years ago   Vaping Use   Vaping status: Never Used  Substance Use Topics   Alcohol use: Yes    Comment: rarely   Drug use: No   Social History   Social History Narrative   Not on file     Immunization History  Administered Date(s) Administered   Fluad Quad(high Dose 65+) 10/05/2020, 09/03/2021, 09/09/2022   Fluad Trivalent(High Dose 65+) 09/12/2023   Moderna Covid-19 Vaccine Bivalent Booster 16yrs & up 09/28/2021   Moderna SARS-COV2 Booster Vaccination 11/10/2020   Moderna Sars-Covid-2 Vaccination 01/23/2020, 02/17/2020, 05/19/2021   Pneumococcal Conjugate-13 03/30/2016   Pneumococcal Polysaccharide-23 09/18/2012     Objective: Vital Signs: BP (!) 182/75   Pulse 79   Temp 98.6 F (37 C)   Resp 14   Ht 5' 1 (1.549 m)   Wt 106 lb 3.2 oz (48.2 kg)   BMI 20.07 kg/m    Physical Exam Vitals and nursing note reviewed.  HENT:     Head: Normocephalic and atraumatic.     Nose: Nose normal.   Eyes:     Conjunctiva/sclera: Conjunctivae normal.     Pupils: Pupils are equal, round, and reactive to light.  Cardiovascular:     Rate and Rhythm: Normal rate and regular rhythm.     Heart sounds: Normal heart sounds.  Pulmonary:     Effort: Pulmonary effort is normal.     Breath sounds: Normal breath sounds.  Skin:    General: Skin is warm and dry.  Neurological:     Mental Status: She is alert. Mental status is at baseline.  Psychiatric:        Mood and Affect: Mood normal.        Behavior: Behavior normal.      Musculoskeletal Exam:   CDAI Exam: CDAI Score: -- Patient Global: --; Provider Global: -- Swollen: --; Tender: -- Joint Exam 10/07/2024   No joint exam has been documented for this visit   There is currently no  information documented on the homunculus. Go to the Rheumatology activity and complete the homunculus joint exam.  Investigation: No additional findings.  Imaging: No results found.  Recent Labs: Lab Results  Component Value Date   WBC 9.7 10/07/2024   HGB 14.1 10/07/2024   PLT 324 10/07/2024   NA 137 07/18/2024   K 4.7 07/18/2024   CL 99 07/18/2024   CO2 21 07/18/2024   GLUCOSE 167 (H) 07/18/2024   BUN 29 (H) 07/18/2024   CREATININE 0.64 10/07/2024   BILITOT 0.3 07/18/2024   ALKPHOS 81 07/18/2024   AST 16 10/07/2024   ALT 14 10/07/2024   PROT 7.0 07/18/2024   ALBUMIN 4.1 07/18/2024   CALCIUM 9.8 07/18/2024   GFRAA 96 10/05/2020   QFTBGOLDPLUS NEGATIVE 09/02/2024    Speciality Comments: No specialty comments available.  Procedures:  No procedures performed Allergies: Sulfa antibiotics   Assessment / Plan:     Visit Diagnoses: PMR (polymyalgia rheumatica)  Patient with symptoms and elevated inflammatory markers, consistent with diagnosis of polymyalgia rheumatica. As discussed with patient, most recent ACR 2015 guidelines recommend prednisone  dose starting between 12.5-25mg  daily with slow taper to 10mg  daily within 4-8 weeks,  then taper by 1mg  q 4 weeks until discontinuation given that remission is maintained.   Patient currently with remission of symptoms on 15mg  daily. Patient instructed to continue 15mg  for one more week then decrease to 12.5mg  for 14 days. She is then instructed to decrease to 10mg  daily and continue that for one month, then decrease by 1mg  every 30 days. Patient instructed to let us  know immediately if she develops a return of symptoms. Will repeat Sedimentation rate, C-reactive protein today.  Patient provided with another refill for Protonix  for ulcer prophylaxis.   Patient also advised that if he develops symptoms concerning for GCA (severe temporal headache, sudden vision loss, jaw claudication) to let us  know and report to the ED immediately for evaluation.   High risk medication use  On prednisone  for PMR. Will repeat baseline toxicity labs today (CBC, AST, ALT, Creatinine, serum) then continue to monitor q3 months.  Orders: Orders Placed This Encounter  Procedures   Sedimentation rate   C-reactive protein   CBC   AST   ALT   Creatinine, serum   Meds ordered this encounter  Medications   pantoprazole  (PROTONIX ) 20 MG tablet    Sig: Take 1 tablet (20 mg total) by mouth daily.    Dispense:  90 tablet    Refill:  0   predniSONE  (DELTASONE ) 5 MG tablet    Sig: Take 3 tablets (15 mg total) by mouth daily with breakfast for 7 days, THEN 2.5 tablets (12.5 mg total) daily with breakfast for 14 days, THEN 2 tablets (10 mg total) daily with breakfast. Take in the morning with breakfast. Do not take with NSAIDS.    Dispense:  116 tablet    Refill:  0    I personally spent a total of 30 minutes in the care of the patient today including preparing to see the patient, getting/reviewing separately obtained history, performing a medically appropriate exam/evaluation, counseling and educating, placing orders, and documenting clinical information in the EHR.   Follow-Up Instructions: Return in  about 3 months (around 01/07/2025).   Asberry Claw, DO  Note - This record has been created using [employer].  Chart creation errors have been sought, but may not always  have been located. Such creation errors do not reflect on  the standard of medical  care.

## 2024-10-07 ENCOUNTER — Ambulatory Visit

## 2024-10-07 VITALS — BP 182/75 | HR 79 | Temp 98.6°F | Resp 14 | Ht 61.0 in | Wt 106.2 lb

## 2024-10-07 DIAGNOSIS — Z79899 Other long term (current) drug therapy: Secondary | ICD-10-CM

## 2024-10-07 DIAGNOSIS — M353 Polymyalgia rheumatica: Secondary | ICD-10-CM | POA: Diagnosis not present

## 2024-10-07 MED ORDER — PANTOPRAZOLE SODIUM 20 MG PO TBEC: 20.0000 mg | DELAYED_RELEASE_TABLET | Freq: Every day | ORAL | 0 refills | Status: DC

## 2024-10-07 MED ORDER — PREDNISONE 5 MG PO TABS: ORAL_TABLET | ORAL | 0 refills | Status: DC

## 2024-10-08 LAB — CBC
HCT: 42.7 % (ref 35.0–45.0)
Hemoglobin: 14.1 g/dL (ref 11.7–15.5)
MCH: 28.7 pg (ref 27.0–33.0)
MCHC: 33 g/dL (ref 32.0–36.0)
MCV: 86.8 fL (ref 80.0–100.0)
MPV: 9.4 fL (ref 7.5–12.5)
Platelets: 324 10*3/uL (ref 140–400)
RBC: 4.92 Million/uL (ref 3.80–5.10)
RDW: 13.9 % (ref 11.0–15.0)
WBC: 9.7 10*3/uL (ref 3.8–10.8)

## 2024-10-08 LAB — CREATININE, SERUM: Creat: 0.64 mg/dL (ref 0.60–0.95)

## 2024-10-08 LAB — AST: AST: 16 U/L (ref 10–35)

## 2024-10-08 LAB — ALT: ALT: 14 U/L (ref 6–29)

## 2024-10-08 LAB — C-REACTIVE PROTEIN: CRP: <3 mg/L

## 2024-10-08 LAB — SEDIMENTATION RATE: Sed Rate: 6 mm/h (ref 0–30)

## 2024-10-21 ENCOUNTER — Ambulatory Visit: Payer: Self-pay

## 2024-11-26 ENCOUNTER — Other Ambulatory Visit: Payer: Self-pay

## 2024-11-26 NOTE — Telephone Encounter (Signed)
 Last Fill: 10/07/2024  Next Visit: 01/07/2025  Last Visit: 10/07/2024  Dx: PMR   Current Dose per office note on 10/07/2024: Instructed to continue 15mg  for one more week then decrease to 12.5mg  for 14 days. She is then instructed to decrease to 10mg  daily and continue that for one month, then decrease by 1mg  every 30 days   Spoke with patient and states she is currently on 10 mg and has been on this dose since 10/07/2024. Please advise what dose you would like patient to taper to. She has 4 days of medication for a 10 mg.   Okay to refill Prednisone ?

## 2024-11-28 ENCOUNTER — Encounter: Payer: Self-pay | Admitting: *Deleted

## 2024-11-28 ENCOUNTER — Other Ambulatory Visit: Payer: Self-pay

## 2024-11-28 ENCOUNTER — Other Ambulatory Visit (HOSPITAL_BASED_OUTPATIENT_CLINIC_OR_DEPARTMENT_OTHER): Payer: Self-pay | Admitting: Family Medicine

## 2024-11-28 DIAGNOSIS — R531 Weakness: Secondary | ICD-10-CM

## 2024-11-28 MED ORDER — PREDNISONE 1 MG PO TABS: ORAL_TABLET | ORAL | 0 refills | Status: DC

## 2024-12-18 ENCOUNTER — Other Ambulatory Visit: Payer: Self-pay | Admitting: Family Medicine

## 2024-12-18 ENCOUNTER — Other Ambulatory Visit: Payer: Self-pay

## 2024-12-18 DIAGNOSIS — I10 Essential (primary) hypertension: Secondary | ICD-10-CM

## 2024-12-20 ENCOUNTER — Telehealth (HOSPITAL_BASED_OUTPATIENT_CLINIC_OR_DEPARTMENT_OTHER): Payer: Self-pay | Admitting: Family Medicine

## 2024-12-20 NOTE — Telephone Encounter (Signed)
 Copied from CRM [id number]. Topic: Appointments - Transfer of Care >> Dec 20, 2024  5:31 PM Lauren C wrote: Pt is requesting to transfer FROM: [place] Pt is requesting to transfer TO: [facility] Reason for requested transfer: Recommendation from a friend. It is the responsibility of the team the patient would like to transfer to ([facility]) to reach out to the patient if for any reason this transfer is not acceptable.   Do you accept TOC

## 2024-12-23 ENCOUNTER — Other Ambulatory Visit (HOSPITAL_BASED_OUTPATIENT_CLINIC_OR_DEPARTMENT_OTHER): Payer: Self-pay | Admitting: Student

## 2024-12-23 ENCOUNTER — Telehealth: Payer: Self-pay

## 2024-12-23 DIAGNOSIS — I10 Essential (primary) hypertension: Secondary | ICD-10-CM

## 2024-12-23 MED ORDER — LISINOPRIL 40 MG PO TABS: 40.0000 mg | ORAL_TABLET | Freq: Two times a day (BID) | ORAL | 1 refills | Status: AC

## 2024-12-23 NOTE — Telephone Encounter (Signed)
 Copied from CRM #[id number]. Topic: Clinical - Prescription Issue >> Dec 20, 2024  5:25 PM Tinnie C wrote: Reason for CRM: Pt will be out of lisinopril  (ZESTRIL ) 40 MG tablet and is unable to get a TOC appt until 1/20 with her preferred provider at [facility]. She is wondering if Dr. Sherre can assist with bridging this medication until she is able to be seen so she will not be missing this medication. She says you may let her know via mychart or call at #[phone] If she does have to wait until 1/20 to get refill, she would like to know if this is safe to miss this medication that long.

## 2024-12-23 NOTE — Telephone Encounter (Signed)
 Copied from CRM #[id number]. Topic: Clinical - Medication Refill >> Dec 23, 2024  9:17 AM Aleatha C wrote: Medication: lisinopril  (ZESTRIL ) 40 MG tablet   Has the patient contacted their pharmacy? No (Agent: If no, request that the patient contact the pharmacy for the refill. If patient does not wish to contact the pharmacy document the reason why and proceed with request.) (Agent: If yes, when and what did the pharmacy advise?)  This is the patient's preferred pharmacy:  [facility] [address] [place] - [address] [place] [address] Phone: [phone] Fax: [phone]  Is this the correct pharmacy for this prescription? Yes If no, delete pharmacy and type the correct one.   Has the prescription been filled recently? No  Is the patient out of the medication? Yes  Has the patient been seen for an appointment in the last year OR does the patient have an upcoming appointment? Yes  Can we respond through MyChart? No  Agent: Please be advised that Rx refills may take up to 3 business days. We ask that you follow-up with your pharmacy.

## 2024-12-24 NOTE — Progress Notes (Unsigned)
 "  Office Visit Note  Patient: Pamela Price             Date of Birth: Mar 09, 1933           MRN: [id number]             PCP: Dottie Norleen [name], MD Referring: No ref. provider found Visit Date: 12/25/2024 Occupation: Data Unavailable  Subjective:  No chief complaint on file.   History of Present Illness: Pamela Price is a 89 y.o. female ***     Activities of Daily Living:  Patient reports morning stiffness for *** {minute/hour:19697}.   Patient {ACTIONS;DENIES/REPORTS:21021675::Denies} nocturnal pain.  Difficulty dressing/grooming: {ACTIONS;DENIES/REPORTS:21021675::Denies} Difficulty climbing stairs: {ACTIONS;DENIES/REPORTS:21021675::Denies} Difficulty getting out of chair: {ACTIONS;DENIES/REPORTS:21021675::Denies} Difficulty using hands for taps, buttons, cutlery, and/or writing: {ACTIONS;DENIES/REPORTS:21021675::Denies}  No Rheumatology ROS completed.   PMFS History:  Patient Active Problem List   Diagnosis Date Noted   Thrombocytosis 07/23/2024   Generalized weakness 07/18/2024   Acute generalized body pain 07/14/2024   Abnormal MRI, spine 07/13/2024   Cervical nerve root impingement 07/10/2024   Encounter for Medicare annual wellness exam 09/16/2023   Hearing loss 09/09/2022   Macular degeneration of both eyes 09/28/2021   Age-related osteoporosis without current pathological fracture 09/28/2021   Primary hypertension 09/03/2021   Mixed hyperlipidemia 09/03/2021   Osteoarthritis 08/30/2017    Past Medical History:  Diagnosis Date   Adnexal mass 06/30/2021   History of right hemicolectomy    HLD (hyperlipidemia) 06/30/2021   Hypertension 06/30/2021   Left ovarian cyst 07/22/2019   Macular degeneration 06/30/2021   bilateral  gets eye injections   Osteoarthritis    Osteoporosis 06/30/2021   Polio    Varicose veins of both lower extremities 06/30/2021   right leg only    Family History  Problem Relation Age of Onset   Liver cancer  Mother    Lymphoma Father    Atrial fibrillation Son    Schizophrenia Son    Past Surgical History:  Procedure Laterality Date   BREAST LUMPECTOMY W/ NEEDLE LOCALIZATION     axillary tail of the left  breast    CATARACT EXTRACTION Bilateral    CESAREAN SECTION     X 2   COLON SURGERY  02/2019   for stage I colon cancer   colonscopy  2020   IR BONE MARROW BIOPSY & ASPIRATION  07/16/2024   ROBOTIC ASSISTED BILATERAL SALPINGO OOPHERECTOMY N/A 07/14/2021   ROBOTIC ASSISTED TOTAL HYSTERECTOMY WITH BILATERAL SALPINGO OOPHERECTOMY  07/14/2021   TOTAL HIP ARTHROPLASTY Right 08/30/2017   Procedure: RIGHT TOTAL HIP ARTHROPLASTY ANTERIOR APPROACH;  Surgeon: Melodi Lerner, MD;  Location: [facility];  Service: Orthopedics;  Laterality: Right;   TUBAL LIGATION Bilateral    Social History[1] Social History   Social History Narrative   Not on file     Immunization History  Administered Date(s) Administered   Fluad Quad(high Dose 65+) 10/05/2020, 09/03/2021, 09/09/2022   Fluad Trivalent(High Dose 65+) 09/12/2023   Moderna Covid-19 Vaccine Bivalent Booster 33yrs & up 09/28/2021   Moderna SARS-COV2 Booster Vaccination 11/10/2020   Moderna Sars-Covid-2 Vaccination 01/23/2020, 02/17/2020, 05/19/2021   Pneumococcal Conjugate-13 03/30/2016   Pneumococcal Polysaccharide-23 09/18/2012     Objective: Vital Signs: There were no vitals taken for this visit.   Physical Exam   Musculoskeletal Exam: ***  CDAI Exam: CDAI Score: -- Patient Global: --; Provider Global: -- Swollen: --; Tender: -- Joint Exam 12/25/2024   No joint exam has been documented for this visit  There is currently no information documented on the homunculus. Go to the Rheumatology activity and complete the homunculus joint exam.  Investigation: No additional findings.  Imaging: No results found.  Recent Labs: Lab Results  Component Value Date   WBC 9.7 10/07/2024   HGB 14.1 10/07/2024   PLT 324 10/07/2024   NA  137 07/18/2024   K 4.7 07/18/2024   CL 99 07/18/2024   CO2 21 07/18/2024   GLUCOSE 167 (H) 07/18/2024   BUN 29 (H) 07/18/2024   CREATININE 0.64 10/07/2024   BILITOT 0.3 07/18/2024   ALKPHOS 81 07/18/2024   AST 16 10/07/2024   ALT 14 10/07/2024   PROT 7.0 07/18/2024   ALBUMIN 4.1 07/18/2024   CALCIUM 9.8 07/18/2024   GFRAA 96 10/05/2020   QFTBGOLDPLUS NEGATIVE 09/02/2024    Speciality Comments: No specialty comments available.  Procedures:  No procedures performed Allergies: Sulfa antibiotics   Assessment / Plan:     Visit Diagnoses: No diagnosis found.  Orders: No orders of the defined types were placed in this encounter.  No orders of the defined types were placed in this encounter.   Face-to-face time spent with patient was *** minutes. Greater than 50% of time was spent in counseling and coordination of care.  Follow-Up Instructions: No follow-ups on file.   Alfonso Patterson, LPN  Note - This record has been created using [employer].  Chart creation errors have been sought, but may not always  have been located. Such creation errors do not reflect on  the standard of medical care.    [1]  Social History Tobacco Use   Smoking status: Former    Types: Cigarettes   Smokeless tobacco: Never   Tobacco comments:    Quit 50 years ago   Vaping Use   Vaping status: Never Used  Substance Use Topics   Alcohol use: Yes    Comment: rarely   Drug use: No   "

## 2024-12-25 ENCOUNTER — Ambulatory Visit

## 2024-12-25 VITALS — BP 177/75 | HR 80 | Temp 97.9°F | Resp 12 | Ht 61.0 in | Wt 108.4 lb

## 2024-12-25 DIAGNOSIS — M353 Polymyalgia rheumatica: Secondary | ICD-10-CM

## 2024-12-25 DIAGNOSIS — Z79899 Other long term (current) drug therapy: Secondary | ICD-10-CM | POA: Diagnosis not present

## 2024-12-25 DIAGNOSIS — M1380 Other specified arthritis, unspecified site: Secondary | ICD-10-CM

## 2024-12-25 DIAGNOSIS — Z7952 Long term (current) use of systemic steroids: Secondary | ICD-10-CM | POA: Diagnosis not present

## 2024-12-25 MED ORDER — PREDNISONE 5 MG PO TABS: ORAL_TABLET | ORAL | 2 refills | Status: AC

## 2024-12-25 MED ORDER — PREDNISONE 1 MG PO TABS: ORAL_TABLET | ORAL | 0 refills | Status: AC

## 2024-12-25 NOTE — Patient Instructions (Addendum)
 Please increase Prednisone  to 10mg  for 14 days, THEN Decrease to Prednisone  9mg  daily for 14 days, THEN Decrease to Prednisone  8mg  daily for 30 days, THEN Decrease to Prednisone  7mg  daily for 30 days, THEN Decrease to Prednisone  6mg  daily for 30 days, THEN Decrease to Prednisone  5mg  daily, CONTINUE THIS DOSE UNTIL YOU FOLLOW-UP WITH RHEUMATOLOGY

## 2024-12-26 ENCOUNTER — Ambulatory Visit (HOSPITAL_BASED_OUTPATIENT_CLINIC_OR_DEPARTMENT_OTHER): Admission: RE | Admit: 2024-12-26 | Discharge: 2024-12-26 | Disposition: A | Source: Ambulatory Visit

## 2024-12-26 DIAGNOSIS — M1380 Other specified arthritis, unspecified site: Secondary | ICD-10-CM

## 2024-12-26 DIAGNOSIS — G8929 Other chronic pain: Secondary | ICD-10-CM

## 2024-12-26 DIAGNOSIS — M79642 Pain in left hand: Secondary | ICD-10-CM

## 2024-12-26 DIAGNOSIS — M79641 Pain in right hand: Secondary | ICD-10-CM

## 2024-12-27 LAB — C-REACTIVE PROTEIN: CRP: <3 mg/L

## 2024-12-27 LAB — RHEUMATOID FACTOR: Rheumatoid fact SerPl-aCnc: <10 [IU]/mL

## 2024-12-27 LAB — SEDIMENTATION RATE: Sed Rate: 11 mm/h (ref 0–30)

## 2024-12-27 LAB — CYCLIC CITRUL PEPTIDE ANTIBODY, IGG: Cyclic Citrullin Peptide Ab: <16 U

## 2024-12-31 ENCOUNTER — Encounter (HOSPITAL_BASED_OUTPATIENT_CLINIC_OR_DEPARTMENT_OTHER): Admitting: Student

## 2025-01-03 ENCOUNTER — Other Ambulatory Visit: Payer: Self-pay

## 2025-01-03 NOTE — Telephone Encounter (Signed)
 Last Fill: 10/07/2024  Next Visit: 03/25/2025  Last Visit: 12/25/2024  Dx: ulcer prophylaxis.   Current Dose per office note on 12/25/2024: Protonix  for ulcer prophylaxis.   Okay to refill Omeprazole?

## 2025-01-07 ENCOUNTER — Ambulatory Visit

## 2025-01-07 ENCOUNTER — Ambulatory Visit: Payer: Self-pay

## 2025-01-08 ENCOUNTER — Ambulatory Visit: Payer: Self-pay

## 2025-03-25 ENCOUNTER — Ambulatory Visit
